# Patient Record
Sex: Female | Born: 1935 | Race: White | Hispanic: No | Smoking: Never smoker
Health system: Southern US, Community
[De-identification: ages and names within clinical notes are randomized; demographics above are authoritative.]

## PROBLEM LIST (undated history)

## (undated) DIAGNOSIS — D649 Anemia, unspecified: Secondary | ICD-10-CM

## (undated) DIAGNOSIS — E785 Hyperlipidemia, unspecified: Secondary | ICD-10-CM

## (undated) DIAGNOSIS — F32A Depression, unspecified: Secondary | ICD-10-CM

## (undated) DIAGNOSIS — F329 Major depressive disorder, single episode, unspecified: Secondary | ICD-10-CM

## (undated) DIAGNOSIS — K5792 Diverticulitis of intestine, part unspecified, without perforation or abscess without bleeding: Secondary | ICD-10-CM

## (undated) DIAGNOSIS — K219 Gastro-esophageal reflux disease without esophagitis: Secondary | ICD-10-CM

## (undated) DIAGNOSIS — I10 Essential (primary) hypertension: Secondary | ICD-10-CM

## (undated) DIAGNOSIS — F419 Anxiety disorder, unspecified: Secondary | ICD-10-CM

## (undated) DIAGNOSIS — N390 Urinary tract infection, site not specified: Secondary | ICD-10-CM

## (undated) DIAGNOSIS — N189 Chronic kidney disease, unspecified: Secondary | ICD-10-CM

## (undated) DIAGNOSIS — Z993 Dependence on wheelchair: Secondary | ICD-10-CM

## (undated) DIAGNOSIS — E039 Hypothyroidism, unspecified: Secondary | ICD-10-CM

## (undated) DIAGNOSIS — T7840XA Allergy, unspecified, initial encounter: Secondary | ICD-10-CM

## (undated) DIAGNOSIS — F0390 Unspecified dementia without behavioral disturbance: Secondary | ICD-10-CM

## (undated) DIAGNOSIS — H269 Unspecified cataract: Secondary | ICD-10-CM

## (undated) HISTORY — PX: EYE SURGERY: SHX253

## (undated) HISTORY — PX: HERNIA REPAIR: SHX51

## (undated) HISTORY — DX: Major depressive disorder, single episode, unspecified: F32.9

## (undated) HISTORY — DX: Unspecified cataract: H26.9

## (undated) HISTORY — DX: Anemia, unspecified: D64.9

## (undated) HISTORY — DX: Essential (primary) hypertension: I10

## (undated) HISTORY — PX: TOTAL VAGINAL HYSTERECTOMY: SHX2548

## (undated) HISTORY — DX: Depression, unspecified: F32.A

## (undated) HISTORY — DX: Gastro-esophageal reflux disease without esophagitis: K21.9

## (undated) HISTORY — PX: APPENDECTOMY: SHX54

## (undated) HISTORY — DX: Allergy, unspecified, initial encounter: T78.40XA

## (undated) HISTORY — DX: Hypothyroidism, unspecified: E03.9

## (undated) HISTORY — DX: Anxiety disorder, unspecified: F41.9

## (undated) HISTORY — DX: Diverticulitis of intestine, part unspecified, without perforation or abscess without bleeding: K57.92

## (undated) HISTORY — DX: Urinary tract infection, site not specified: N39.0

## (undated) HISTORY — DX: Hyperlipidemia, unspecified: E78.5

---

## 1997-08-26 ENCOUNTER — Ambulatory Visit (HOSPITAL_COMMUNITY): Admission: RE | Admit: 1997-08-26 | Discharge: 1997-08-26 | Payer: Self-pay | Admitting: Internal Medicine

## 1997-10-11 ENCOUNTER — Ambulatory Visit (HOSPITAL_COMMUNITY): Admission: RE | Admit: 1997-10-11 | Discharge: 1997-10-12 | Payer: Self-pay | Admitting: Neurosurgery

## 1997-10-11 ENCOUNTER — Encounter: Payer: Self-pay | Admitting: Neurosurgery

## 2000-05-29 ENCOUNTER — Encounter: Payer: Self-pay | Admitting: Family Medicine

## 2000-05-29 ENCOUNTER — Ambulatory Visit (HOSPITAL_COMMUNITY): Admission: RE | Admit: 2000-05-29 | Discharge: 2000-05-29 | Payer: Self-pay | Admitting: Family Medicine

## 2001-02-06 ENCOUNTER — Other Ambulatory Visit: Admission: RE | Admit: 2001-02-06 | Discharge: 2001-02-06 | Payer: Self-pay | Admitting: Internal Medicine

## 2001-06-19 ENCOUNTER — Encounter: Payer: Self-pay | Admitting: Family Medicine

## 2001-06-19 ENCOUNTER — Ambulatory Visit (HOSPITAL_COMMUNITY): Admission: RE | Admit: 2001-06-19 | Discharge: 2001-06-19 | Payer: Self-pay | Admitting: Family Medicine

## 2002-09-09 ENCOUNTER — Encounter: Payer: Self-pay | Admitting: Unknown Physician Specialty

## 2002-09-09 ENCOUNTER — Ambulatory Visit (HOSPITAL_COMMUNITY): Admission: RE | Admit: 2002-09-09 | Discharge: 2002-09-09 | Payer: Self-pay | Admitting: Unknown Physician Specialty

## 2002-09-24 ENCOUNTER — Encounter: Payer: Self-pay | Admitting: Unknown Physician Specialty

## 2002-09-24 ENCOUNTER — Ambulatory Visit (HOSPITAL_COMMUNITY): Admission: RE | Admit: 2002-09-24 | Discharge: 2002-09-24 | Payer: Self-pay | Admitting: Unknown Physician Specialty

## 2003-06-04 ENCOUNTER — Ambulatory Visit (HOSPITAL_COMMUNITY): Admission: RE | Admit: 2003-06-04 | Discharge: 2003-06-04 | Payer: Self-pay | Admitting: Unknown Physician Specialty

## 2003-12-27 ENCOUNTER — Ambulatory Visit: Payer: Self-pay | Admitting: Family Medicine

## 2003-12-30 ENCOUNTER — Ambulatory Visit (HOSPITAL_COMMUNITY): Admission: RE | Admit: 2003-12-30 | Discharge: 2003-12-30 | Payer: Self-pay | Admitting: Family Medicine

## 2004-01-10 ENCOUNTER — Ambulatory Visit: Payer: Self-pay | Admitting: *Deleted

## 2004-01-10 ENCOUNTER — Ambulatory Visit (HOSPITAL_COMMUNITY): Admission: RE | Admit: 2004-01-10 | Discharge: 2004-01-10 | Payer: Self-pay | Admitting: Family Medicine

## 2004-01-26 ENCOUNTER — Ambulatory Visit: Payer: Self-pay | Admitting: Family Medicine

## 2004-04-24 ENCOUNTER — Ambulatory Visit: Payer: Self-pay | Admitting: Family Medicine

## 2004-06-12 ENCOUNTER — Ambulatory Visit: Payer: Self-pay | Admitting: Family Medicine

## 2004-06-14 ENCOUNTER — Ambulatory Visit (HOSPITAL_COMMUNITY): Admission: RE | Admit: 2004-06-14 | Discharge: 2004-06-14 | Payer: Self-pay | Admitting: Family Medicine

## 2004-06-16 ENCOUNTER — Ambulatory Visit (HOSPITAL_COMMUNITY): Admission: RE | Admit: 2004-06-16 | Discharge: 2004-06-16 | Payer: Self-pay | Admitting: Family Medicine

## 2004-06-19 ENCOUNTER — Ambulatory Visit: Payer: Self-pay | Admitting: Family Medicine

## 2004-07-11 ENCOUNTER — Ambulatory Visit: Payer: Self-pay | Admitting: Family Medicine

## 2004-11-02 ENCOUNTER — Ambulatory Visit: Payer: Self-pay | Admitting: Family Medicine

## 2004-12-10 ENCOUNTER — Emergency Department (HOSPITAL_COMMUNITY): Admission: EM | Admit: 2004-12-10 | Discharge: 2004-12-10 | Payer: Self-pay | Admitting: Emergency Medicine

## 2004-12-21 ENCOUNTER — Ambulatory Visit: Payer: Self-pay | Admitting: Family Medicine

## 2005-01-15 ENCOUNTER — Ambulatory Visit: Payer: Self-pay | Admitting: Family Medicine

## 2005-02-27 ENCOUNTER — Ambulatory Visit: Payer: Self-pay | Admitting: Family Medicine

## 2005-03-13 ENCOUNTER — Ambulatory Visit (HOSPITAL_COMMUNITY): Admission: RE | Admit: 2005-03-13 | Discharge: 2005-03-13 | Payer: Self-pay | Admitting: Family Medicine

## 2005-04-23 ENCOUNTER — Ambulatory Visit: Payer: Self-pay | Admitting: Family Medicine

## 2005-08-01 ENCOUNTER — Ambulatory Visit (HOSPITAL_COMMUNITY): Admission: RE | Admit: 2005-08-01 | Discharge: 2005-08-01 | Payer: Self-pay | Admitting: Urology

## 2005-08-27 ENCOUNTER — Ambulatory Visit: Payer: Self-pay | Admitting: Family Medicine

## 2005-12-10 ENCOUNTER — Ambulatory Visit: Payer: Self-pay | Admitting: Family Medicine

## 2005-12-18 ENCOUNTER — Ambulatory Visit: Payer: Self-pay | Admitting: Family Medicine

## 2006-01-01 ENCOUNTER — Ambulatory Visit: Payer: Self-pay | Admitting: Family Medicine

## 2006-02-28 ENCOUNTER — Ambulatory Visit: Payer: Self-pay | Admitting: Family Medicine

## 2006-05-15 ENCOUNTER — Ambulatory Visit: Payer: Self-pay | Admitting: Family Medicine

## 2006-05-30 ENCOUNTER — Ambulatory Visit: Payer: Self-pay | Admitting: Family Medicine

## 2006-07-30 ENCOUNTER — Ambulatory Visit: Payer: Self-pay | Admitting: Family Medicine

## 2006-11-07 ENCOUNTER — Ambulatory Visit (HOSPITAL_COMMUNITY): Admission: RE | Admit: 2006-11-07 | Discharge: 2006-11-07 | Payer: Self-pay | Admitting: Family Medicine

## 2008-06-09 ENCOUNTER — Encounter: Admission: RE | Admit: 2008-06-09 | Discharge: 2008-06-09 | Payer: Self-pay | Admitting: Neurology

## 2008-06-23 ENCOUNTER — Encounter: Admission: RE | Admit: 2008-06-23 | Discharge: 2008-09-06 | Payer: Self-pay | Admitting: Neurology

## 2010-03-12 ENCOUNTER — Encounter: Payer: Self-pay | Admitting: Neurology

## 2010-07-07 NOTE — Procedures (Signed)
NAMEAALA, RANSOM NO.:  [id number]   MEDICAL RECORD NO.:  [id number]          PATIENT TYPE:  OUT   LOCATION:  RAD                           FACILITY:  [facility]   PHYSICIAN:  Vida Roller, M.D.   DATE OF BIRTH:  1924/04/06   DATE OF PROCEDURE:  01/10/2004  DATE OF DISCHARGE:                                  ECHOCARDIOGRAM   PRIMARY CARE PHYSICIAN:  Delaney Meigs, M.D.   TAPE NUMBER:  [id number].   TAPE COUNT:  [id number] through 3252.   HISTORY OF PRESENT ILLNESS:  This is a 77 year old woman with double vision,  no previous echocardiogram's.  The technical quality is adequate.   M-MODE TRACINGS:  The aorta is 28 mm.   Left atrium is 39 mm.   The septum is 18 mm.   Posterior wall is 15 mm.   Left ventricular diastolic dimension is 31 mm.   Left ventricular systolic dimension is 21 mm.   2-D AND DOPPLER IMAGING:  The left ventricle is normal size.  There is a  slightly small internal cavity diameter.  There is moderate concentric left  ventricular hypertrophy with significant predominance in the septum  consistent with a sigmoid septum of the elderly.  There is no obvious left  ventricular outflow tract gradient.  The overall ejection fraction is  estimated at 65 to 70%.  There are no obvious wall motion abnormalities  seen.   The right ventricle is normal size with normal systolic function.   Both atria are top-normal in size.   The aortic valve is sclerotic with no stenosis or regurgitation.   The mitral valve has some annular calcification and mild myxomatous change,  has trace insufficiency, no stenosis is seen.   The tricuspid valve has mild regurgitation.   Pulmonic valve not well seen.   Pericardial structures are without effusion.   The inferior vena cava appears to be normal size, though it is not well  seen.  There are views which make it difficult to interpret the area around  where it enters the right atrium.   The ascending aorta  appears to be enlarged in the parasternal long axis  view.  Clinical correlation is advised.     Trey Paula   JH/MEDQ  D:  01/10/2004  T:  01/10/2004  Job:  [id number]

## 2010-08-22 ENCOUNTER — Other Ambulatory Visit (HOSPITAL_COMMUNITY): Payer: Self-pay | Admitting: Family Medicine

## 2010-08-22 DIAGNOSIS — Z139 Encounter for screening, unspecified: Secondary | ICD-10-CM

## 2010-08-29 ENCOUNTER — Ambulatory Visit (HOSPITAL_COMMUNITY): Payer: Self-pay

## 2010-09-04 ENCOUNTER — Ambulatory Visit (HOSPITAL_COMMUNITY)
Admission: RE | Admit: 2010-09-04 | Discharge: 2010-09-04 | Disposition: A | Discharge status: Home / Self Care | Payer: Medicare Other | Source: Ambulatory Visit | Attending: Family Medicine | Admitting: Family Medicine

## 2010-09-04 DIAGNOSIS — Z1231 Encounter for screening mammogram for malignant neoplasm of breast: Secondary | ICD-10-CM | POA: Insufficient documentation

## 2010-09-04 DIAGNOSIS — Z139 Encounter for screening, unspecified: Secondary | ICD-10-CM

## 2011-08-15 ENCOUNTER — Other Ambulatory Visit (HOSPITAL_COMMUNITY): Payer: Self-pay | Admitting: Family Medicine

## 2011-08-15 DIAGNOSIS — Z139 Encounter for screening, unspecified: Secondary | ICD-10-CM

## 2011-09-06 ENCOUNTER — Ambulatory Visit (HOSPITAL_COMMUNITY): Payer: Medicare Other

## 2011-09-10 ENCOUNTER — Ambulatory Visit (HOSPITAL_COMMUNITY)
Admission: RE | Admit: 2011-09-10 | Discharge: 2011-09-10 | Disposition: A | Discharge status: Home / Self Care | Payer: Medicare Other | Source: Ambulatory Visit | Attending: Family Medicine | Admitting: Family Medicine

## 2011-09-10 DIAGNOSIS — Z1231 Encounter for screening mammogram for malignant neoplasm of breast: Secondary | ICD-10-CM | POA: Insufficient documentation

## 2011-09-10 DIAGNOSIS — Z139 Encounter for screening, unspecified: Secondary | ICD-10-CM

## 2011-11-29 ENCOUNTER — Ambulatory Visit: Payer: Medicare Other | Attending: Family Medicine | Admitting: Sleep Medicine

## 2011-11-29 DIAGNOSIS — G4733 Obstructive sleep apnea (adult) (pediatric): Secondary | ICD-10-CM | POA: Insufficient documentation

## 2011-11-29 DIAGNOSIS — G4700 Insomnia, unspecified: Secondary | ICD-10-CM

## 2011-12-05 NOTE — Procedures (Signed)
[facility] Brannon Levene A. Gerilyn Pilgrim, MD     [email or web address]        NAMEEVANGELYNE, Deborah Rivers              ACCOUNT NO.:  [id number]  MEDICAL RECORD NO.:  [id number]          PATIENT TYPE:  OUT  LOCATION:  SLEEP LAB                     FACILITY:  [facility]  PHYSICIAN:  Erasto Sleight A. Gerilyn Pilgrim, M.D. DATE OF BIRTH:  1924/12/24  DATE OF STUDY:                           NOCTURNAL POLYSOMNOGRAM  REFERRING PHYSICIAN:  Delaney Meigs, M.D.  INDICATION:  An 78 year old, who presents with difficulty falling, restless sleep, fatigue, and snoring.  MEDICATIONS:  Amlodipine, simvastatin, vitamin D, and Synthroid.  EPWORTH SLEEPINESS SCALE:  4.  BMI 23.  ARCHITECTURAL SUMMARY:  The total recording time is 382 minutes.  Sleep efficiency 46%, sleep latency 91 minutes, REM latency 107 minutes, stage N1 of 21%, N2 of 59%, N3 of 1%, and REM sleep 19%.  RESPIRATORY SUMMARY:  Baseline oxygen saturation is 95, lowest saturation 80 during non-REM sleep.  Diagnostic AHI is 12 and RDI also 12.  The events tend to occur more during REM sleep with a REM index of 25.  LIMB MOVEMENT SUMMARY:  PLM index 33.  ELECTROCARDIOGRAM SUMMARY:  Average heart rate is 80 with no significant dysrhythmias observed.  IMPRESSION: 1. Moderate obstructive sleep apnea syndrome. 2. Moderate to severe periodic limb movement disorder sleep. 3. Abnormal sleep architecture with poor sleep efficiency.  RECOMMENDATIONS:  Formal CPAP titration study.  Thanks for this referral.    Nadalee Neiswender A. Gerilyn Pilgrim, M.D.    KAD/MEDQ  D:  12/05/2011 08:54:04  T:  12/05/2011 09:23:16  Job:  [id number]

## 2011-12-18 ENCOUNTER — Ambulatory Visit: Payer: Medicare Other | Attending: Family Medicine | Admitting: Sleep Medicine

## 2011-12-18 ENCOUNTER — Encounter: Payer: Self-pay | Admitting: Neurology

## 2011-12-18 DIAGNOSIS — G4700 Insomnia, unspecified: Secondary | ICD-10-CM

## 2011-12-18 DIAGNOSIS — G4733 Obstructive sleep apnea (adult) (pediatric): Secondary | ICD-10-CM | POA: Insufficient documentation

## 2011-12-19 NOTE — Procedures (Signed)
[facility] Phillipa Morden A. Gerilyn Pilgrim, MD     [email or web address]        LOCATION: SLEEP LAB FACILITY: [facility]  PHYSICIAN: Verlie Hellenbrand A. Gerilyn Pilgrim, M.D.   DATE OF STUDY: 12/18/2011  NOCTURNAL POLYSOMNOGRAM   REFERRING PHYSICIAN: Dr. Lysbeth Galas.  INDICATIONS: This is an 78 year old who was diagnosed with obstructive sleep apnea syndrome with a previous recording.  MEDICATIONS:  Amlodipine, simvastatin, Synthroid  EPWORTH SLEEPINESS SCALE: 4.  BMI: 23.   ARCHITECTURAL SUMMARY: Total recording time of 415 minutes. Sleep efficiency is very poor at 33%. The sleep latency is 98 minutes. The REM latency 147 minutes. Stage and one 70%, stage and to 64%, stage and 33% and REM sleep 15%.  RESPIRATORY DATA:  Baseline oxygen saturation is 97%. The lowest saturation is 90% during non-REM sleep. The patient was placed on positive pressure is between 5 and 8. The optimal pressure is 8 with resolution of obstructive events and good tolerance.  LIMB MOVEMENT SUMMARY: PLM index 6.5.   ELECTROCARDIOGRAM SUMMARY: Average heart rate is  82 with no significant  dysrhythmias observed.   IMPRESSION: Obstructive sleep apnea syndrome which responds well to a CPAP of 8.  Leahna Hewson A. Gerilyn Pilgrim, M.D. Diplomat, Biomedical engineer of Sleep Medicine.

## 2012-10-21 ENCOUNTER — Encounter: Payer: Self-pay | Admitting: Gastroenterology

## 2012-11-17 ENCOUNTER — Ambulatory Visit (INDEPENDENT_AMBULATORY_CARE_PROVIDER_SITE_OTHER): Payer: Medicare Other | Admitting: Gastroenterology

## 2012-11-17 ENCOUNTER — Encounter: Payer: Self-pay | Admitting: Gastroenterology

## 2012-11-17 VITALS — BP 122/70 | HR 92 | Ht 63.0 in | Wt 124.0 lb

## 2012-11-17 DIAGNOSIS — R634 Abnormal weight loss: Secondary | ICD-10-CM

## 2012-11-17 DIAGNOSIS — R1319 Other dysphagia: Secondary | ICD-10-CM

## 2012-11-17 DIAGNOSIS — K219 Gastro-esophageal reflux disease without esophagitis: Secondary | ICD-10-CM

## 2012-11-17 NOTE — Progress Notes (Signed)
History of Present Illness: This is an 79 year old female who relates a several year history of intermittent heartburn following meals and occasional burning and difficulty swallowing solids such as meat and the symptoms occur every several months. She notes occasional throat clearing and sinus drainage. She states she's lost weight over the past several months in her appetite has not been good. She cannot quantify the amount. She states she previously underwent colonoscopy at [facility] about 7 or 8 years ago and she were reports that examination was normal. She was prescribed omeprazole but only takes it occasionally. Denies abdominal pain, constipation, diarrhea, change in stool caliber, melena, hematochezia, nausea, vomiting, chest pain.  Review of Systems: Pertinent positive and negative review of systems were noted in the above HPI section. All other review of systems were otherwise negative.  Current Medications, Allergies, Past Medical History, Past Surgical History, Family History and Social History were reviewed in [employer] record.  Physical Exam: General: Well developed , well nourished, no acute distress Head: Normocephalic and atraumatic Eyes:  sclerae anicteric, EOMI Ears: Normal auditory acuity Mouth: No deformity or lesions Neck: Supple, no masses or thyromegaly Lungs: Clear throughout to auscultation Heart: Regular rate and rhythm; no murmurs, rubs or bruits Abdomen: Soft, non tender and non distended. No masses, hepatosplenomegaly or hernias noted. Normal Bowel sounds Rectal: deferred Musculoskeletal: Symmetrical with no gross deformities  Skin: No lesions on visible extremities Pulses:  Normal pulses noted Extremities: No clubbing, cyanosis, edema or deformities noted Neurological: Alert oriented x 4, grossly nonfocal Cervical Nodes:  No significant cervical adenopathy Inguinal Nodes: No significant inguinal adenopathy Psychological:  Alert  and cooperative. Normal mood and affect  Assessment and Recommendations:  1. GERD, dysphagia and weight loss. Take omeprazole 40 mg every morning as prescribed. Begin standard antireflux measures. Schedule an upper endoscopy with possible dilation. The risks, benefits, and alternatives to endoscopy with possible biopsy and possible dilation were discussed with the patient and they consent to proceed. Will attempt to obtain records from prior colonoscopy. Consider abdominal/pelvic CT if no source of weight loss is uncovered.

## 2012-11-17 NOTE — Patient Instructions (Addendum)
You have been scheduled for an endoscopy with propofol. Please follow written instructions given to you at your visit today. If you use inhalers (even only as needed), please bring them with you on the day of your procedure.  Take your omeprazole once daily every day.  Patient advised to avoid spicy, acidic, citrus, chocolate, mints, fruit and fruit juices.  Limit the intake of caffeine, alcohol and Soda.  Don't exercise too soon after eating.  Don't lie down within 3-4 hours of eating.  Elevate the head of your bed.  Thank you for choosing me and [facility].  Venita Lick. Pleas Koch., MD., Clementeen Graham

## 2012-11-18 ENCOUNTER — Encounter: Payer: Self-pay | Admitting: Gastroenterology

## 2012-11-18 ENCOUNTER — Ambulatory Visit (AMBULATORY_SURGERY_CENTER): Payer: Medicare Other | Admitting: Gastroenterology

## 2012-11-18 VITALS — BP 152/81 | HR 76 | Temp 97.6°F | Resp 20 | Ht 63.0 in | Wt 124.0 lb

## 2012-11-18 DIAGNOSIS — R1319 Other dysphagia: Secondary | ICD-10-CM

## 2012-11-18 DIAGNOSIS — K222 Esophageal obstruction: Secondary | ICD-10-CM

## 2012-11-18 DIAGNOSIS — K219 Gastro-esophageal reflux disease without esophagitis: Secondary | ICD-10-CM

## 2012-11-18 MED ORDER — SODIUM CHLORIDE 0.9 % IV SOLN: 500.0000 mL | INTRAVENOUS | Status: DC

## 2012-11-18 NOTE — Progress Notes (Addendum)
Patient did not have preoperative order for IV antibiotic SSI prophylaxis. (G8918)  Patient did not experience any of the following events: a burn prior to discharge; a fall within the facility; wrong site/side/patient/procedure/implant event; or a hospital transfer or hospital admission upon discharge from the facility. (G8907)  

## 2012-11-18 NOTE — Progress Notes (Signed)
Called to room to assist during endoscopic procedure.  Patient ID and intended procedure confirmed with present staff. Received instructions for my participation in the procedure from the performing physician.  

## 2012-11-18 NOTE — Patient Instructions (Addendum)
YOU HAD AN ENDOSCOPIC PROCEDURE TODAY AT THE [facility]: Refer to the procedure report that was given to you for any specific questions about what was found during the examination.  If the procedure report does not answer your questions, please call your gastroenterologist to clarify.  If you requested that your care partner not be given the details of your procedure findings, then the procedure report has been included in a sealed envelope for you to review at your convenience later.  YOU SHOULD EXPECT: Some feelings of bloating in the abdomen. Passage of more gas than usual.  Walking can help get rid of the air that was put into your GI tract during the procedure and reduce the bloating. If you had a lower endoscopy (such as a colonoscopy or flexible sigmoidoscopy) you may notice spotting of blood in your stool or on the toilet paper. If you underwent a bowel prep for your procedure, then you may not have a normal bowel movement for a few days.  DIET:You were dilated so you may not have anything by mouth until 10:15 am.  If the clear liquids are tolderated, you may go to the soft diet on yur sheet.  Do force fluids today.  Call us if you have any issues.  You should avoid alcoholic beverages for 24 hours.  ACTIVITY: Your care partner should take you home directly after the procedure.  You should plan to take it easy, moving slowly for the rest of the day.  You can resume normal activity the day after the procedure however you should NOT DRIVE or use heavy machinery for 24 hours (because of the sedation medicines used during the test).    SYMPTOMS TO REPORT IMMEDIATELY: A gastroenterologist can be reached at any hour.  During normal business hours, 8:30 AM to 5:00 PM Monday through Friday, call [phone].  After hours and on weekends, please call the GI answering service at [phone] who will take a message and have the physician on call contact you.   Following upper endoscopy  (EGD)  Vomiting of blood or coffee ground material  New chest pain or pain under the shoulder blades  Painful or persistently difficult swallowing  New shortness of breath  Fever of 100F or higher  Black, tarry-looking stools  FOLLOW UP: If any biopsies were taken you will be contacted by phone or by letter within the next 1-3 weeks.  Call your gastroenterologist if you have not heard about the biopsies in 3 weeks.  Our staff will call the home number listed on your records the next business day following your procedure to check on you and address any questions or concerns that you may have at that time regarding the information given to you following your procedure. This is a courtesy call and so if there is no answer at the home number and we have not heard from you through the emergency physician on call, we will assume that you have returned to your regular daily activities without incident.  SIGNATURES/CONFIDENTIALITY: You and/or your care partner have signed paperwork which will be entered into your electronic medical record.  These signatures attest to the fact that that the information above on your After Visit Summary has been reviewed and is understood.  Full responsibility of the confidentiality of this discharge information lies with you and/or your care-partner.

## 2012-11-18 NOTE — Op Note (Signed)
[facility] [address] [place], [address]   ENDOSCOPY PROCEDURE REPORT  PATIENT: Deborah, Rivers  MR#: [id number] BIRTHDATE: 17-Dec-1924 , 88  yrs. old GENDER: Female ENDOSCOPIST: Meryl Dare, MD, [facility] REFERRED BY:  Joette Catching, M.D. PROCEDURE DATE:  11/18/2012 PROCEDURE:  EGD, diagnostic and Savary dilation of esophagus ASA CLASS:     Class II INDICATIONS:  Dysphagia.   History of esophageal reflux. MEDICATIONS: MAC sedation, administered by CRNA and propofol (Diprivan) 150mg  IV TOPICAL ANESTHETIC: Cetacaine Spray DESCRIPTION OF PROCEDURE: After the risks benefits and alternatives of the procedure were thoroughly explained, informed consent was obtained.  The LB [id number] endoscope was introduced through the mouth and advanced to the second portion of the duodenum  without limitations.  The instrument was slowly withdrawn as the mucosa was fully examined.  ESOPHAGUS: A stricture was found at the gastroesophageal junction. It measured 12 mm in diameter. The stenosis was traversable with the endoscope.   There was LA Class B esophagitis noted.   The esophagus was otherwise normal. STOMACH: Erosive gastritis was found in the gastric body.   The stomach otherwise appeared normal. DUODENUM: The duodenal mucosa showed no abnormalities in the bulb and second portion of the duodenum.  Retroflexed views revealed a 4 cm hiatal hernia. A guidewire was place and the scope was then withdrawn from the patient. 13 mm, 14 mm and 15 mm Savary dilator were passed over the guidewire with mild resistance and no heme noted and the procedure completed.  COMPLICATIONS: There were no complications.  ENDOSCOPIC IMPRESSION: 1.   Stricture at the gastroesophageal junction 2.   LA Class B esophagitis 3.   Hiatal hernia 4.   Erosive gastritis in the gastric body  RECOMMENDATIONS: 1.  Anti-reflux regimen long term 2.  Continue PPI: omeprazole 40 mg po qam long  term 3.  post dilation instructions   eSigned:  Meryl Dare, MD, [facility] 11/18/2012 9:04 AM

## 2012-11-19 ENCOUNTER — Telehealth: Payer: Self-pay | Admitting: *Deleted

## 2012-11-19 NOTE — Telephone Encounter (Signed)
  Follow up Call-  Call back number 11/18/2012  Post procedure Call Back phone  # [phone]  Permission to leave phone message Yes     Patient questions:  Do you have a fever, pain , or abdominal swelling? no Pain Score  0 *  Have you tolerated food without any problems? yes  Have you been able to return to your normal activities? yes  Do you have any questions about your discharge instructions: Diet   no Medications  no Follow up visit  no  Do you have questions or concerns about your Care? no  Actions: * If pain score is 4 or above: No action needed, pain <4.

## 2013-04-08 ENCOUNTER — Encounter: Payer: Self-pay | Admitting: Women's Health

## 2013-06-22 DIAGNOSIS — N390 Urinary tract infection, site not specified: Secondary | ICD-10-CM | POA: Insufficient documentation

## 2013-06-22 DIAGNOSIS — M549 Dorsalgia, unspecified: Secondary | ICD-10-CM | POA: Insufficient documentation

## 2013-07-20 DIAGNOSIS — D649 Anemia, unspecified: Secondary | ICD-10-CM | POA: Insufficient documentation

## 2014-02-25 ENCOUNTER — Encounter: Payer: Self-pay | Admitting: *Deleted

## 2014-02-25 ENCOUNTER — Ambulatory Visit: Payer: Medicare Other | Attending: Physical Medicine and Rehabilitation | Admitting: Physical Therapy

## 2014-02-25 DIAGNOSIS — M545 Low back pain: Secondary | ICD-10-CM | POA: Diagnosis present

## 2014-02-25 DIAGNOSIS — M5442 Lumbago with sciatica, left side: Secondary | ICD-10-CM | POA: Insufficient documentation

## 2014-02-25 DIAGNOSIS — M546 Pain in thoracic spine: Secondary | ICD-10-CM | POA: Insufficient documentation

## 2014-02-26 ENCOUNTER — Encounter: Payer: Self-pay | Admitting: Cardiology

## 2014-02-26 ENCOUNTER — Ambulatory Visit (INDEPENDENT_AMBULATORY_CARE_PROVIDER_SITE_OTHER): Payer: Medicare Other | Admitting: Cardiology

## 2014-02-26 ENCOUNTER — Institutional Professional Consult (permissible substitution): Payer: Medicare Other | Admitting: Cardiovascular Disease

## 2014-02-26 VITALS — BP 118/66 | HR 80 | Ht 63.0 in | Wt 123.0 lb

## 2014-02-26 DIAGNOSIS — I700 Atherosclerosis of aorta: Secondary | ICD-10-CM

## 2014-02-26 DIAGNOSIS — E782 Mixed hyperlipidemia: Secondary | ICD-10-CM

## 2014-02-26 DIAGNOSIS — R0989 Other specified symptoms and signs involving the circulatory and respiratory systems: Secondary | ICD-10-CM

## 2014-02-26 DIAGNOSIS — I10 Essential (primary) hypertension: Secondary | ICD-10-CM | POA: Insufficient documentation

## 2014-02-26 DIAGNOSIS — R011 Cardiac murmur, unspecified: Secondary | ICD-10-CM

## 2014-02-26 NOTE — Progress Notes (Signed)
Reason for visit: Reported aortic calcification  Clinical Summary Deborah Rivers is an 81 y.o.female referred for cardiology consultation by Ms. Hemberg NP with [facility]. Records indicate that she was noted to have some degree of aortic calcification based on plain films obtained during a recent orthopedic consultation - details are not clear. She is here with a family member today. She reports no chest pain symptoms or unusual shortness of breath. Tries to stay as active as she can. She reports compliance with her medications, outlined below, and including aspirin, Norvasc, and simvastatin. Statin dose was just increased for more aggressive lipid management. ECG today showed sinus rhythm with borderline low voltage and decreased R wave progression. She has no known history of ischemic heart disease or myocardial infarction.  Labwork from November 2015 showed hemoglobin 12.2, platelets 323, BUN 17, creatinine 1.2, potassium 4.2, AST 19, ALT 9, cholesterol 211, triglycerides 103, HDL 94, LDL 96, TSH 2.8.  Old chart reviewed. Carotid Dopplers from 2008 reported minimal ICA atherosclerotic plaque. Previous echocardiogram from 2005 reported moderate LVH with LVEF 65-70%, sclerotic aortic valve without stenosis, Mac with thickened mitral valve and trace mitral regurgitation, mild tricuspid regurgitation. She has had no follow-up testing since that time.  Allergies  Allergen Reactions  . Sulfa Antibiotics Rash    Current Outpatient Prescriptions  Medication Sig Dispense Refill  . amLODipine (NORVASC) 5 MG tablet Take 5 mg by mouth daily.    Marland Kitchen aspirin 81 MG tablet Take 81 mg by mouth daily.    . DULoxetine (CYMBALTA) 60 MG capsule Take 60 mg by mouth daily.    . ergocalciferol (VITAMIN D2) 50000 UNITS capsule Take 50,000 Units by mouth once a week.    . levothyroxine (SYNTHROID, LEVOTHROID) 25 MCG tablet Take 25 mcg by mouth daily before breakfast.    . LORazepam (ATIVAN)  0.5 MG tablet Take 0.5 mg by mouth as needed for anxiety.    Marland Kitchen omeprazole (PRILOSEC) 40 MG capsule Take 40 mg by mouth daily.    . simvastatin (ZOCOR) 20 MG tablet Take 20 mg by mouth every evening.     No current facility-administered medications for this visit.    Past Medical History  Diagnosis Date  . Essential hypertension   . Hypothyroidism   . Hyperlipidemia   . GERD (gastroesophageal reflux disease)   . Anemia     Past Surgical History  Procedure Laterality Date  . Total vaginal hysterectomy    . Hernia repair    . Appendectomy      Family History  Problem Relation Age of Onset  . Diabetes    . Heart disease    . Kidney disease      Social History Ms. Katich reports that she has never smoked. She has never used smokeless tobacco. Ms. Juenger reports that she does not drink alcohol.  Review of Systems Complete review of systems negative except as otherwise outlined in the clinical summary and also the following. No palpitations or syncope. No focal motor weakness. Reports arthritic pains in her back and legs, also apparently bursitis of her left hip recently. No definite claudication. Mild ankle edema.  Physical Examination Filed Vitals:   02/26/14 0850  BP: 118/66  Pulse: 80   Filed Weights   02/26/14 0850  Weight: 123 lb (55.792 kg)   Thin, elderly woman, appears comfortable at rest. HEENT: Conjunctiva and lids normal, oropharynx clear. Neck: Supple, no elevated JVP, soft carotid bruits, no thyromegaly. Lungs: Clear to  auscultation, nonlabored breathing at rest. Cardiac: Regular rate and rhythm, no S3, 2/6 basal systolic murmur, no pericardial rub. Abdomen: Soft, nontender, bowel sounds present, no bruit, no guarding or rebound. Extremities: Mild ankle edema, distal pulses 2+. Skin: Warm and dry. Musculoskeletal: Mild kyphosis. Neuropsychiatric: Alert and oriented x3, affect grossly appropriate.   Problem List and Plan   Aortic  calcification Reported by plain films, details are not available to me at this time. Ms. Jernberg almost certainly has some degree of atherosclerosis, and has also had documentation of this in the past based on prior testing. No history of ischemic heart disease or aneurysmal disease however. She is not reporting any chest pain or unusual breathlessness. ECG reviewed and nonspecific. I have recommended carotid Dopplers to assess for any significant degree of carotid stenosis that would require intervention beyond medical therapy. We will also obtain an echocardiogram to assess valvular status. Follow-up arranged. Otherwise continue aspirin and statin, efforts at blood pressure control.  Mixed hyperlipidemia Recent LDL 96, simvastatin just increased to 20 mg daily.  Essential hypertension She continues on Norvasc, blood pressure normal today.    Satira Sark, M.D., F.A.C.C.

## 2014-02-26 NOTE — Patient Instructions (Signed)
Your physician recommends that you schedule a follow-up appointment in: 6 months. You will receive a reminder letter in the mail in about 4 months reminding you to call and schedule your appointment. If you don't receive this letter, please contact our office. Your physician recommends that you continue on your current medications as directed. Please refer to the Current Medication list given to you today. Your physician has requested that you have an echocardiogram. Echocardiography is a painless test that uses sound waves to create images of your heart. It provides your doctor with information about the size and shape of your heart and how well your heart's chambers and valves are working. This procedure takes approximately one hour. There are no restrictions for this procedure. Your physician has requested that you have a carotid duplex. This test is an ultrasound of the carotid arteries in your neck. It looks at blood flow through these arteries that supply the brain with blood. Allow one hour for this exam. There are no restrictions or special instructions.

## 2014-02-26 NOTE — Assessment & Plan Note (Signed)
She continues on Norvasc, blood pressure normal today.

## 2014-02-26 NOTE — Assessment & Plan Note (Signed)
Reported by plain films, details are not available to me at this time. Deborah Rivers almost certainly has some degree of atherosclerosis, and has also had documentation of this in the past based on prior testing. No history of ischemic heart disease or aneurysmal disease however. She is not reporting any chest pain or unusual breathlessness. ECG reviewed and nonspecific. I have recommended carotid Dopplers to assess for any significant degree of carotid stenosis that would require intervention beyond medical therapy. We will also obtain an echocardiogram to assess valvular status. Follow-up arranged. Otherwise continue aspirin and statin, efforts at blood pressure control.

## 2014-02-26 NOTE — Assessment & Plan Note (Signed)
Recent LDL 96, simvastatin just increased to 20 mg daily.

## 2014-03-02 ENCOUNTER — Ambulatory Visit: Payer: Medicare Other | Admitting: *Deleted

## 2014-03-02 DIAGNOSIS — M5442 Lumbago with sciatica, left side: Secondary | ICD-10-CM | POA: Diagnosis not present

## 2014-03-04 ENCOUNTER — Ambulatory Visit: Payer: Medicare Other | Admitting: Physical Therapy

## 2014-03-04 DIAGNOSIS — M5442 Lumbago with sciatica, left side: Secondary | ICD-10-CM | POA: Diagnosis not present

## 2014-03-08 ENCOUNTER — Telehealth: Payer: Self-pay | Admitting: Gastroenterology

## 2014-03-08 NOTE — Telephone Encounter (Signed)
Patient advised that she should continue her Prilosec or a similar drug long term per procedure report on 11/18/12.  All questions answered.  She will call back for any additional questions or concerns

## 2014-03-09 ENCOUNTER — Ambulatory Visit: Payer: Medicare Other | Admitting: Physical Therapy

## 2014-03-09 DIAGNOSIS — M5442 Lumbago with sciatica, left side: Secondary | ICD-10-CM | POA: Diagnosis not present

## 2014-03-10 ENCOUNTER — Other Ambulatory Visit (INDEPENDENT_AMBULATORY_CARE_PROVIDER_SITE_OTHER): Payer: Medicare Other

## 2014-03-10 ENCOUNTER — Encounter (INDEPENDENT_AMBULATORY_CARE_PROVIDER_SITE_OTHER): Payer: Medicare Other

## 2014-03-10 ENCOUNTER — Other Ambulatory Visit: Payer: Self-pay

## 2014-03-10 DIAGNOSIS — R0989 Other specified symptoms and signs involving the circulatory and respiratory systems: Secondary | ICD-10-CM

## 2014-03-10 DIAGNOSIS — I700 Atherosclerosis of aorta: Secondary | ICD-10-CM

## 2014-03-10 DIAGNOSIS — R011 Cardiac murmur, unspecified: Secondary | ICD-10-CM

## 2014-03-11 ENCOUNTER — Encounter: Payer: Medicare Other | Admitting: *Deleted

## 2014-03-16 ENCOUNTER — Encounter: Payer: Medicare Other | Admitting: *Deleted

## 2014-03-17 ENCOUNTER — Telehealth: Payer: Self-pay | Admitting: *Deleted

## 2014-03-17 NOTE — Telephone Encounter (Signed)
Patient informed. 

## 2014-03-17 NOTE — Telephone Encounter (Signed)
-----   Message from Satira Sark, MD sent at 03/11/2014  9:19 AM EST ----- Reviewed report. Please let her know that there is mild atherosclerotic plaque with her carotid arteries, only 1-39% bilateral ICA stenosis. We will treat this with medications as mentioned in the last note, she does not further intervention for this at this particular time.

## 2014-03-17 NOTE — Telephone Encounter (Signed)
-----   Message from Satira Sark, MD sent at 03/10/2014  5:15 PM EST ----- Reviewed report. Please let her know that LV systolic function is normal. She does have some mild calcification of the aortic valve as well as mitral valve with mild aortic regurgitation and mitral regurgitation, neither of these issues should be symptom provoking however, and can be followed conservatively. As we mentioned at her office visit, focus will be on medical therapy, will also follow-up carotid studies that are pending.

## 2014-03-18 ENCOUNTER — Encounter: Payer: Medicare Other | Admitting: *Deleted

## 2014-06-08 ENCOUNTER — Encounter: Payer: Self-pay | Admitting: Cardiovascular Disease

## 2014-06-08 ENCOUNTER — Ambulatory Visit (INDEPENDENT_AMBULATORY_CARE_PROVIDER_SITE_OTHER): Payer: Medicare Other | Admitting: Cardiovascular Disease

## 2014-06-08 VITALS — BP 128/64 | HR 71 | Ht 63.0 in | Wt 118.0 lb

## 2014-06-08 DIAGNOSIS — I700 Atherosclerosis of aorta: Secondary | ICD-10-CM | POA: Diagnosis not present

## 2014-06-08 DIAGNOSIS — E782 Mixed hyperlipidemia: Secondary | ICD-10-CM

## 2014-06-08 DIAGNOSIS — I10 Essential (primary) hypertension: Secondary | ICD-10-CM

## 2014-06-08 NOTE — Progress Notes (Signed)
06/08/2014 Deborah Rivers   Sep 24, 1924  [id number]  Primary Physician Sherrie Mustache, MD Primary Cardiologist: Lorretta Harp MD Renae Gloss   HPI:  Deborah Rivers is a delightful 81 year old thin-appearing widowed Caucasian female mother of 2 children, grandmother of 4 grandchildren whose primary care physician is Dr. Edrick Oh. She was referred to be established for ongoing cardiovascular care. Her chronic risk factor profile is notable only for 2 hypertension and hyperlipidemia as well as family history. Her father had myocardial infarction as typical of her brothers. She has never had a heart attack or stroke and denies chest pain or shortness of breath. She also denies claudication. She walks on the treadmill without limitation. She clearly had an x-ray performed in orthopedic surgeon's office was remarkable for "atherosclerosis".   Current Outpatient Prescriptions  Medication Sig Dispense Refill  . amLODipine (NORVASC) 5 MG tablet Take 5 mg by mouth daily.    Marland Kitchen aspirin 81 MG tablet Take 81 mg by mouth daily.    Marland Kitchen atorvastatin (LIPITOR) 20 MG tablet Take 20 mg by mouth daily.    . DULoxetine (CYMBALTA) 60 MG capsule Take 30 mg by mouth daily.     . ergocalciferol (VITAMIN D2) 50000 UNITS capsule Take 50,000 Units by mouth once a week.    . levothyroxine (SYNTHROID, LEVOTHROID) 25 MCG tablet Take 25 mcg by mouth daily before breakfast.    . LORazepam (ATIVAN) 0.5 MG tablet Take 0.5 mg by mouth as needed for anxiety.     No current facility-administered medications for this visit.    Allergies  Allergen Reactions  . Sulfa Antibiotics Rash    History   Social History  . Marital Status: Widowed    Spouse Name: N/A  . Number of Children: 2  . Years of Education: N/A   Occupational History  . Retried    Social History Main Topics  . Smoking status: Never Smoker   . Smokeless tobacco: Never Used  . Alcohol Use: No  . Drug Use: No  . Sexual Activity: Not  on file   Other Topics Concern  . Not on file   Social History Narrative     Review of Systems: General: negative for chills, fever, night sweats or weight changes.  Cardiovascular: negative for chest pain, dyspnea on exertion, edema, orthopnea, palpitations, paroxysmal nocturnal dyspnea or shortness of breath Dermatological: negative for rash Respiratory: negative for cough or wheezing Urologic: negative for hematuria Abdominal: negative for nausea, vomiting, diarrhea, bright red blood per rectum, melena, or hematemesis Neurologic: negative for visual changes, syncope, or dizziness All other systems reviewed and are otherwise negative except as noted above.    Blood pressure 128/64, pulse 71, height 5\' 3"  (1.6 m), weight 118 lb (53.524 kg).  General appearance: alert and no distress Neck: no adenopathy, no carotid bruit, no JVD, supple, symmetrical, trachea midline and thyroid not enlarged, symmetric, no tenderness/mass/nodules Lungs: clear to auscultation bilaterally Heart: regular rate and rhythm, S1, S2 normal, no murmur, click, rub or gallop Extremities: extremities normal, atraumatic, no cyanosis or edema and 22+ pedal pulses bilaterally  EKG normal sinus rhythm at 71 without ST or T-wave changes. I personally reviewed this EKG  ASSESSMENT AND PLAN:   Mixed hyperlipidemia History of hyperlipidemia on atorvastatin 20 mg a day followed by her PCP   Essential hypertension History of hypertension on amlodipine for blood pressure measured at 128/64. Continue current meds at current dosing   Aortic calcification History of aortic calcification on x-ray.  We will obtain those films and determine whether or not she needs additional imaging modalities.       Lorretta Harp MD FACP,FACC,FAHA, [facility] 06/08/2014 10:36 AM

## 2014-06-08 NOTE — Assessment & Plan Note (Signed)
History of hypertension on amlodipine for blood pressure measured at 128/64. Continue current meds at current dosing

## 2014-06-08 NOTE — Assessment & Plan Note (Signed)
History of hyperlipidemia on atorvastatin 20 mg a day followed by her PCP 

## 2014-06-08 NOTE — Assessment & Plan Note (Addendum)
History of aortic calcification on x-ray. We will obtain those films and determine whether or not she needs additional imaging modalities.

## 2014-06-08 NOTE — Patient Instructions (Signed)
  We will see you back in follow up in 1 year with Dr Gwenlyn Found.   Dr Gwenlyn Found has ordered: 1. Abdominal aorta duplex. During this test, an ultrasound is used to evaluate the aorta. Allow 30 minutes for this exam. Do not eat after midnight the day before and avoid carbonated beverages

## 2014-06-14 ENCOUNTER — Other Ambulatory Visit (HOSPITAL_COMMUNITY): Payer: Self-pay | Admitting: Cardiovascular Disease

## 2014-06-14 ENCOUNTER — Encounter (HOSPITAL_COMMUNITY): Payer: Medicare Other

## 2014-06-14 DIAGNOSIS — I700 Atherosclerosis of aorta: Secondary | ICD-10-CM

## 2014-06-16 ENCOUNTER — Emergency Department (HOSPITAL_COMMUNITY)
Admission: EM | Admit: 2014-06-16 | Discharge: 2014-06-16 | Disposition: A | Discharge status: Home / Self Care | Payer: Medicare Other | Attending: Emergency Medicine | Admitting: Emergency Medicine

## 2014-06-16 ENCOUNTER — Encounter (HOSPITAL_COMMUNITY): Payer: Self-pay

## 2014-06-16 ENCOUNTER — Other Ambulatory Visit: Payer: Self-pay | Admitting: *Deleted

## 2014-06-16 DIAGNOSIS — I10 Essential (primary) hypertension: Secondary | ICD-10-CM

## 2014-06-16 DIAGNOSIS — F419 Anxiety disorder, unspecified: Secondary | ICD-10-CM | POA: Diagnosis not present

## 2014-06-16 DIAGNOSIS — M549 Dorsalgia, unspecified: Secondary | ICD-10-CM | POA: Insufficient documentation

## 2014-06-16 DIAGNOSIS — Z862 Personal history of diseases of the blood and blood-forming organs and certain disorders involving the immune mechanism: Secondary | ICD-10-CM | POA: Diagnosis not present

## 2014-06-16 DIAGNOSIS — Z8719 Personal history of other diseases of the digestive system: Secondary | ICD-10-CM | POA: Diagnosis not present

## 2014-06-16 DIAGNOSIS — T380X5A Adverse effect of glucocorticoids and synthetic analogues, initial encounter: Secondary | ICD-10-CM | POA: Diagnosis not present

## 2014-06-16 DIAGNOSIS — Z7982 Long term (current) use of aspirin: Secondary | ICD-10-CM | POA: Diagnosis not present

## 2014-06-16 DIAGNOSIS — E785 Hyperlipidemia, unspecified: Secondary | ICD-10-CM | POA: Diagnosis not present

## 2014-06-16 DIAGNOSIS — Z79899 Other long term (current) drug therapy: Secondary | ICD-10-CM | POA: Insufficient documentation

## 2014-06-16 DIAGNOSIS — T50905A Adverse effect of unspecified drugs, medicaments and biological substances, initial encounter: Secondary | ICD-10-CM

## 2014-06-16 DIAGNOSIS — E039 Hypothyroidism, unspecified: Secondary | ICD-10-CM | POA: Insufficient documentation

## 2014-06-16 LAB — URINE MICROSCOPIC-ADD ON

## 2014-06-16 LAB — URINALYSIS, ROUTINE W REFLEX MICROSCOPIC
GLUCOSE, UA: NEGATIVE mg/dL
HGB URINE DIPSTICK: NEGATIVE
KETONES UR: 15 mg/dL — AB
Nitrite: NEGATIVE
PROTEIN: NEGATIVE mg/dL
Specific Gravity, Urine: 1.03 (ref 1.005–1.030)
UROBILINOGEN UA: 1 mg/dL (ref 0.0–1.0)
pH: 5 (ref 5.0–8.0)

## 2014-06-16 LAB — CBC
HCT: 38.1 % (ref 36.0–46.0)
Hemoglobin: 12.8 g/dL (ref 12.0–15.0)
MCH: 28.6 pg (ref 26.0–34.0)
MCHC: 33.6 g/dL (ref 30.0–36.0)
MCV: 85 fL (ref 78.0–100.0)
PLATELETS: 297 10*3/uL (ref 150–400)
RBC: 4.48 MIL/uL (ref 3.87–5.11)
RDW: 15.7 % — ABNORMAL HIGH (ref 11.5–15.5)
WBC: 14.6 10*3/uL — AB (ref 4.0–10.5)

## 2014-06-16 LAB — COMPREHENSIVE METABOLIC PANEL
ALBUMIN: 3.5 g/dL (ref 3.5–5.2)
ALT: 19 U/L (ref 0–35)
ANION GAP: 8 (ref 5–15)
AST: 19 U/L (ref 0–37)
Alkaline Phosphatase: 75 U/L (ref 39–117)
BUN: 36 mg/dL — AB (ref 6–23)
CALCIUM: 9.1 mg/dL (ref 8.4–10.5)
CHLORIDE: 105 mmol/L (ref 96–112)
CO2: 27 mmol/L (ref 19–32)
CREATININE: 1.56 mg/dL — AB (ref 0.50–1.10)
GFR calc Af Amer: 33 mL/min — ABNORMAL LOW (ref >90)
GFR calc non Af Amer: 29 mL/min — ABNORMAL LOW (ref >90)
GLUCOSE: 92 mg/dL (ref 70–99)
Potassium: 4.2 mmol/L (ref 3.5–5.1)
Sodium: 140 mmol/L (ref 135–145)
Total Bilirubin: 0.7 mg/dL (ref 0.3–1.2)
Total Protein: 5.6 g/dL — ABNORMAL LOW (ref 6.0–8.3)

## 2014-06-16 LAB — TSH: TSH: 1.146 u[IU]/mL (ref 0.350–4.500)

## 2014-06-16 NOTE — ED Provider Notes (Addendum)
CSN: [id number]     Arrival date & time 06/16/14  1355 History   First MD Initiated Contact with Patient 06/16/14 1717     Chief Complaint  Patient presents with  . Anxiety     (Consider location/radiation/quality/duration/timing/severity/associated sxs/prior Treatment) Patient is a 81 y.o. female presenting with anxiety. The history is provided by the patient.  Anxiety Pertinent negatives include no chest pain, no abdominal pain, no headaches and no shortness of breath.   patient has been feeling strange over the last few days. Has been more tearful. Has been somewhat fatigued also. Had slight confusion also regards for pills, which is not that unusual for her. Denies fever, chest pain denies headache. She just finished up a redness on Dosepak for back pain and also had a cortisone injection by Dr. Nelva Bush around 8 days ago. She has had prednisone once in the past without difficulty.  Past Medical History  Diagnosis Date  . Essential hypertension   . Hypothyroidism   . Hyperlipidemia   . GERD (gastroesophageal reflux disease)   . Anemia    Past Surgical History  Procedure Laterality Date  . Total vaginal hysterectomy    . Hernia repair    . Appendectomy     Family History  Problem Relation Age of Onset  . Diabetes    . Heart disease    . Kidney disease    . Stroke Mother   . Heart attack Father   . Heart attack Maternal Grandmother   . Heart attack Maternal Grandfather   . Heart attack Paternal Grandmother   . Heart attack Paternal Grandfather   . Heart attack Brother   . Cancer Sister    History  Substance Use Topics  . Smoking status: Never Smoker   . Smokeless tobacco: Never Used  . Alcohol Use: No   OB History    No data available     Review of Systems  Constitutional: Negative for activity change and appetite change.  Eyes: Negative for pain.  Respiratory: Negative for chest tightness and shortness of breath.   Cardiovascular: Negative for chest pain and  leg swelling.  Gastrointestinal: Negative for nausea, vomiting, abdominal pain and diarrhea.  Genitourinary: Negative for dysuria and flank pain.  Musculoskeletal: Positive for back pain. Negative for neck stiffness.  Skin: Negative for rash.  Neurological: Negative for weakness, numbness and headaches.  Psychiatric/Behavioral: Negative for behavioral problems. The patient is nervous/anxious.       Allergies  Sulfa antibiotics  Home Medications   Prior to Admission medications   Medication Sig Start Date End Date Taking? Authorizing Provider  amLODipine (NORVASC) 5 MG tablet Take 5 mg by mouth daily.    Historical Provider, MD  aspirin 81 MG tablet Take 81 mg by mouth daily.    Historical Provider, MD  atorvastatin (LIPITOR) 20 MG tablet Take 20 mg by mouth daily. 03/23/14 03/23/15  Historical Provider, MD  DULoxetine (CYMBALTA) 60 MG capsule Take 30 mg by mouth daily.     Historical Provider, MD  ergocalciferol (VITAMIN D2) 50000 UNITS capsule Take 50,000 Units by mouth once a week.    Historical Provider, MD  levothyroxine (SYNTHROID, LEVOTHROID) 25 MCG tablet Take 25 mcg by mouth daily before breakfast.    Historical Provider, MD  LORazepam (ATIVAN) 0.5 MG tablet Take 0.5 mg by mouth as needed for anxiety.    Historical Provider, MD   BP 112/72 mmHg  Pulse 79  Temp(Src) 97.4 F (36.3 C) (Oral)  Resp 27  SpO2 99% Physical Exam  Constitutional: She is oriented to person, place, and time. She appears well-developed and well-nourished.  HENT:  Head: Normocephalic and atraumatic.  Eyes: EOM are normal. Pupils are equal, round, and reactive to light.  Neck: Normal range of motion. Neck supple.  Cardiovascular: Normal rate, regular rhythm and normal heart sounds.   No murmur heard. Pulmonary/Chest: Effort normal and breath sounds normal. No respiratory distress. She has no wheezes. She has no rales.  Abdominal: Soft. Bowel sounds are normal. She exhibits no distension. There is no  tenderness. There is no rebound and no guarding.  Musculoskeletal: Normal range of motion.  Neurological: She is alert and oriented to person, place, and time. No cranial nerve deficit.  Slight confusion, defers to daughter to answer many questions. Near baseline per family member.  Skin: Skin is warm and dry.  Psychiatric: She has a normal mood and affect. Her speech is normal.  Nursing note and vitals reviewed.   ED Course  Procedures (including critical care time) Labs Review Labs Reviewed  CBC - Abnormal; Notable for the following:    WBC 14.6 (*)    RDW 15.7 (*)    All other components within normal limits  COMPREHENSIVE METABOLIC PANEL - Abnormal; Notable for the following:    BUN 36 (*)    Creatinine, Ser 1.56 (*)    Total Protein 5.6 (*)    GFR calc non Af Amer 29 (*)    GFR calc Af Amer 33 (*)    All other components within normal limits  URINALYSIS, ROUTINE W REFLEX MICROSCOPIC - Abnormal; Notable for the following:    Color, Urine AMBER (*)    Bilirubin Urine SMALL (*)    Ketones, ur 15 (*)    Leukocytes, UA SMALL (*)    All other components within normal limits  URINE MICROSCOPIC-ADD ON - Abnormal; Notable for the following:    Squamous Epithelial / LPF FEW (*)    Casts HYALINE CASTS (*)    All other components within normal limits  TSH    Imaging Review No results found.   EKG Interpretation   Date/Time:  Wednesday June 16 2014 18:10:16 EDT Ventricular Rate:  75 PR Interval:  146 QRS Duration: 76 QT Interval:  405 QTC Calculation: 452 R Axis:   -32 Text Interpretation:  Sinus rhythm Atrial premature complexes in couplets  Probable anterolateral infarct, old Confirmed by Shonica Weier  MD, Yajahira Tison  [id number]) on 06/16/2014 7:49:02 PM      MDM   Final diagnoses:  Medication reaction, initial encounter    Patient was feeling bad and had some confusion. May be related to recent steroids. Nonfocal exam. Lab work reassuring. Will discharge home. Creatinine  is mildly elevated and will need to be followed. Patient has been informed of the elevation of creatinine    Davonna Belling, MD 06/16/14 1951  Patient did have some sats documented in the 80s, however does not appear accurate. There was not a good waveform on the monitor.    Davonna Belling, MD 06/16/14 [phone]

## 2014-06-16 NOTE — Discharge Instructions (Signed)
The confusion may repeat be related to the prednisone. Follow-up with your primary care doctor.

## 2014-06-16 NOTE — ED Notes (Signed)
Phlebotomy at the bedside  

## 2014-06-16 NOTE — ED Notes (Signed)
Pt here with DIL. States she woke up this morning at 0900 then went back to bed and woke up at 1000 and took her medications.. Has just finished a prednisone dose pack Monday and has been having a rough few days where she has been shakey and anxious and nervous, crying a lot.

## 2014-06-23 ENCOUNTER — Ambulatory Visit (HOSPITAL_COMMUNITY)
Admission: RE | Admit: 2014-06-23 | Discharge: 2014-06-23 | Disposition: A | Discharge status: Home / Self Care | Payer: Medicare Other | Source: Ambulatory Visit | Attending: Internal Medicine | Admitting: Internal Medicine

## 2014-06-23 DIAGNOSIS — I10 Essential (primary) hypertension: Secondary | ICD-10-CM | POA: Insufficient documentation

## 2014-07-05 ENCOUNTER — Telehealth: Payer: Self-pay | Admitting: Cardiovascular Disease

## 2014-07-05 NOTE — Telephone Encounter (Signed)
Pt would like her test results from 06-23-14 please.

## 2014-07-05 NOTE — Telephone Encounter (Signed)
Line busy when dialed. 

## 2014-07-06 NOTE — Telephone Encounter (Signed)
Spoke with patient and advised of AAA results. She stated concern over info she was told by Dr. Nelva Bush that she had plaque in her abdominal aorta - noted mild atherosclerosis. Advised patient she is on appropriate medications of statin and asa. She states all the family members on her dad's side died of MI or stroke. Advised patient she can request a repeat test in one year if she is still concerned. She voiced understanding.

## 2014-09-09 ENCOUNTER — Ambulatory Visit (INDEPENDENT_AMBULATORY_CARE_PROVIDER_SITE_OTHER): Payer: Medicare Other | Admitting: Otolaryngology

## 2014-09-09 DIAGNOSIS — H903 Sensorineural hearing loss, bilateral: Secondary | ICD-10-CM

## 2014-09-21 ENCOUNTER — Telehealth: Payer: Self-pay | Admitting: Cardiology

## 2014-09-21 NOTE — Telephone Encounter (Signed)
Going to another doctor

## 2015-04-05 ENCOUNTER — Other Ambulatory Visit (HOSPITAL_COMMUNITY): Payer: Self-pay | Admitting: Family Medicine

## 2015-04-05 DIAGNOSIS — R200 Anesthesia of skin: Secondary | ICD-10-CM

## 2015-04-05 DIAGNOSIS — R42 Dizziness and giddiness: Secondary | ICD-10-CM

## 2015-04-06 ENCOUNTER — Other Ambulatory Visit: Payer: Self-pay | Admitting: Obstetrics and Gynecology

## 2015-04-08 ENCOUNTER — Ambulatory Visit (HOSPITAL_BASED_OUTPATIENT_CLINIC_OR_DEPARTMENT_OTHER)
Admission: RE | Admit: 2015-04-08 | Discharge: 2015-04-08 | Disposition: A | Discharge status: Home / Self Care | Payer: Medicare Other | Source: Ambulatory Visit | Attending: Family Medicine | Admitting: Family Medicine

## 2015-04-08 ENCOUNTER — Ambulatory Visit (HOSPITAL_COMMUNITY)
Admission: RE | Admit: 2015-04-08 | Discharge: 2015-04-08 | Disposition: A | Discharge status: Home / Self Care | Payer: Medicare Other | Source: Ambulatory Visit | Attending: Family Medicine | Admitting: Family Medicine

## 2015-04-08 DIAGNOSIS — R200 Anesthesia of skin: Secondary | ICD-10-CM

## 2015-04-08 DIAGNOSIS — G319 Degenerative disease of nervous system, unspecified: Secondary | ICD-10-CM | POA: Insufficient documentation

## 2015-04-08 DIAGNOSIS — R42 Dizziness and giddiness: Secondary | ICD-10-CM | POA: Diagnosis present

## 2015-04-08 DIAGNOSIS — I351 Nonrheumatic aortic (valve) insufficiency: Secondary | ICD-10-CM | POA: Insufficient documentation

## 2015-04-08 DIAGNOSIS — R9082 White matter disease, unspecified: Secondary | ICD-10-CM | POA: Diagnosis not present

## 2015-04-08 DIAGNOSIS — I340 Nonrheumatic mitral (valve) insufficiency: Secondary | ICD-10-CM

## 2015-04-08 DIAGNOSIS — R531 Weakness: Secondary | ICD-10-CM | POA: Insufficient documentation

## 2015-06-01 ENCOUNTER — Ambulatory Visit: Payer: Medicare Other | Admitting: Gastroenterology

## 2015-08-12 ENCOUNTER — Other Ambulatory Visit: Payer: Self-pay | Admitting: Neurosurgery

## 2015-08-12 DIAGNOSIS — M5412 Radiculopathy, cervical region: Secondary | ICD-10-CM

## 2015-08-24 ENCOUNTER — Other Ambulatory Visit: Payer: Self-pay | Admitting: Neurosurgery

## 2015-08-24 ENCOUNTER — Ambulatory Visit
Admission: RE | Admit: 2015-08-24 | Discharge: 2015-08-24 | Disposition: A | Discharge status: Home / Self Care | Payer: Medicare Other | Source: Ambulatory Visit | Attending: Neurosurgery | Admitting: Neurosurgery

## 2015-08-24 DIAGNOSIS — M5412 Radiculopathy, cervical region: Secondary | ICD-10-CM

## 2015-08-24 MED ORDER — TRIAMCINOLONE ACETONIDE 40 MG/ML IJ SUSP (RADIOLOGY)
60.0000 mg | Freq: Once | INTRAMUSCULAR | Status: AC
  Administered 2015-08-24: 60 mg via EPIDURAL

## 2015-08-24 MED ORDER — IOHEXOL 300 MG/ML  SOLN
1.0000 mL | Freq: Once | INTRAMUSCULAR | Status: AC | PRN
  Administered 2015-08-24: 1 mL via EPIDURAL

## 2015-08-24 NOTE — Discharge Instructions (Signed)

## 2015-11-19 ENCOUNTER — Emergency Department (HOSPITAL_COMMUNITY): Payer: Medicare Other

## 2015-11-19 ENCOUNTER — Encounter (HOSPITAL_COMMUNITY): Payer: Self-pay | Admitting: Emergency Medicine

## 2015-11-19 ENCOUNTER — Observation Stay (HOSPITAL_COMMUNITY)
Admission: EM | Admit: 2015-11-19 | Discharge: 2015-11-21 | Disposition: A | Discharge status: Home / Self Care | Payer: Medicare Other | Attending: Internal Medicine | Admitting: Internal Medicine

## 2015-11-19 DIAGNOSIS — Z7982 Long term (current) use of aspirin: Secondary | ICD-10-CM | POA: Diagnosis not present

## 2015-11-19 DIAGNOSIS — K5792 Diverticulitis of intestine, part unspecified, without perforation or abscess without bleeding: Secondary | ICD-10-CM | POA: Diagnosis present

## 2015-11-19 DIAGNOSIS — K5732 Diverticulitis of large intestine without perforation or abscess without bleeding: Secondary | ICD-10-CM | POA: Diagnosis not present

## 2015-11-19 DIAGNOSIS — I10 Essential (primary) hypertension: Secondary | ICD-10-CM | POA: Diagnosis not present

## 2015-11-19 DIAGNOSIS — N183 Chronic kidney disease, stage 3 unspecified: Secondary | ICD-10-CM | POA: Diagnosis present

## 2015-11-19 DIAGNOSIS — Z79899 Other long term (current) drug therapy: Secondary | ICD-10-CM | POA: Diagnosis not present

## 2015-11-19 DIAGNOSIS — R531 Weakness: Secondary | ICD-10-CM | POA: Diagnosis present

## 2015-11-19 DIAGNOSIS — F0390 Unspecified dementia without behavioral disturbance: Secondary | ICD-10-CM | POA: Diagnosis present

## 2015-11-19 DIAGNOSIS — N390 Urinary tract infection, site not specified: Secondary | ICD-10-CM | POA: Diagnosis not present

## 2015-11-19 DIAGNOSIS — E876 Hypokalemia: Secondary | ICD-10-CM | POA: Diagnosis present

## 2015-11-19 DIAGNOSIS — E039 Hypothyroidism, unspecified: Secondary | ICD-10-CM | POA: Diagnosis not present

## 2015-11-19 DIAGNOSIS — F39 Unspecified mood [affective] disorder: Secondary | ICD-10-CM | POA: Diagnosis present

## 2015-11-19 LAB — URINALYSIS, ROUTINE W REFLEX MICROSCOPIC
GLUCOSE, UA: NEGATIVE mg/dL
Ketones, ur: NEGATIVE mg/dL
LEUKOCYTES UA: NEGATIVE
NITRITE: NEGATIVE
PROTEIN: 30 mg/dL — AB
Specific Gravity, Urine: 1.026 (ref 1.005–1.030)
pH: 6 (ref 5.0–8.0)

## 2015-11-19 LAB — I-STAT CHEM 8, ED
BUN: 25 mg/dL — AB (ref 6–20)
CALCIUM ION: 1.14 mmol/L — AB (ref 1.15–1.40)
CHLORIDE: 98 mmol/L — AB (ref 101–111)
CREATININE: 1.3 mg/dL — AB (ref 0.44–1.00)
GLUCOSE: 116 mg/dL — AB (ref 65–99)
HCT: 40 % (ref 36.0–46.0)
Hemoglobin: 13.6 g/dL (ref 12.0–15.0)
POTASSIUM: 3.1 mmol/L — AB (ref 3.5–5.1)
Sodium: 136 mmol/L (ref 135–145)
TCO2: 25 mmol/L (ref 0–100)

## 2015-11-19 LAB — COMPREHENSIVE METABOLIC PANEL
ALBUMIN: 4.3 g/dL (ref 3.5–5.0)
ALT: 15 U/L (ref 14–54)
AST: 22 U/L (ref 15–41)
Alkaline Phosphatase: 68 U/L (ref 38–126)
Anion gap: 11 (ref 5–15)
BILIRUBIN TOTAL: 0.7 mg/dL (ref 0.3–1.2)
BUN: 26 mg/dL — AB (ref 6–20)
CHLORIDE: 100 mmol/L — AB (ref 101–111)
CO2: 24 mmol/L (ref 22–32)
CREATININE: 1.17 mg/dL — AB (ref 0.44–1.00)
Calcium: 9.7 mg/dL (ref 8.9–10.3)
GFR calc Af Amer: 46 mL/min — ABNORMAL LOW (ref >60)
GFR calc non Af Amer: 40 mL/min — ABNORMAL LOW (ref >60)
Glucose, Bld: 118 mg/dL — ABNORMAL HIGH (ref 65–99)
POTASSIUM: 3.1 mmol/L — AB (ref 3.5–5.1)
Sodium: 135 mmol/L (ref 135–145)
Total Protein: 7.5 g/dL (ref 6.5–8.1)

## 2015-11-19 LAB — POC OCCULT BLOOD, ED: Fecal Occult Bld: NEGATIVE

## 2015-11-19 LAB — CBC WITH DIFFERENTIAL/PLATELET
BASOS ABS: 0 10*3/uL (ref 0.0–0.1)
BASOS PCT: 0 %
Eosinophils Absolute: 0.1 10*3/uL (ref 0.0–0.7)
Eosinophils Relative: 0 %
HEMATOCRIT: 38.6 % (ref 36.0–46.0)
Hemoglobin: 12.8 g/dL (ref 12.0–15.0)
LYMPHS PCT: 8 %
Lymphs Abs: 1.4 10*3/uL (ref 0.7–4.0)
MCH: 28.8 pg (ref 26.0–34.0)
MCHC: 33.2 g/dL (ref 30.0–36.0)
MCV: 86.7 fL (ref 78.0–100.0)
MONO ABS: 1.7 10*3/uL — AB (ref 0.1–1.0)
Monocytes Relative: 9 %
NEUTROS ABS: 15.4 10*3/uL — AB (ref 1.7–7.7)
Neutrophils Relative %: 83 %
PLATELETS: 320 10*3/uL (ref 150–400)
RBC: 4.45 MIL/uL (ref 3.87–5.11)
RDW: 14.3 % (ref 11.5–15.5)
WBC: 18.6 10*3/uL — AB (ref 4.0–10.5)

## 2015-11-19 LAB — LIPASE, BLOOD: Lipase: 22 U/L (ref 11–51)

## 2015-11-19 LAB — URINE MICROSCOPIC-ADD ON

## 2015-11-19 MED ORDER — METRONIDAZOLE IN NACL 5-0.79 MG/ML-% IV SOLN
500.0000 mg | Freq: Once | INTRAVENOUS | Status: DC
  Filled 2015-11-19: qty 100

## 2015-11-19 MED ORDER — CIPROFLOXACIN IN D5W 400 MG/200ML IV SOLN
400.0000 mg | Freq: Once | INTRAVENOUS | Status: DC
  Administered 2015-11-20: 400 mg via INTRAVENOUS
  Filled 2015-11-19: qty 200

## 2015-11-19 MED ORDER — SODIUM CHLORIDE 0.9 % IV BOLUS (SEPSIS)
500.0000 mL | Freq: Once | INTRAVENOUS | Status: AC
  Administered 2015-11-19: 500 mL via INTRAVENOUS

## 2015-11-19 MED ORDER — DEXTROSE 5 % IV SOLN
1.0000 g | Freq: Once | INTRAVENOUS | Status: AC
  Administered 2015-11-19: 1 g via INTRAVENOUS
  Filled 2015-11-19: qty 10

## 2015-11-19 MED ORDER — IOPAMIDOL (ISOVUE-300) INJECTION 61%
30.0000 mL | Freq: Once | INTRAVENOUS | Status: AC | PRN
  Administered 2015-11-19: 30 mL via ORAL

## 2015-11-19 MED ORDER — IOPAMIDOL (ISOVUE-300) INJECTION 61%
100.0000 mL | Freq: Once | INTRAVENOUS | Status: AC | PRN
  Administered 2015-11-19: 80 mL via INTRAVENOUS

## 2015-11-19 NOTE — ED Notes (Signed)
Patient returned from bathroom and had hit her right posterior wrist area where PIV was located, area bruised and bleeding-IV removed and restarted. Rectal exam and POC hemoccult obtained by Elmyra Ricks EDPA

## 2015-11-19 NOTE — ED Triage Notes (Signed)
Pt was diagnosed with a UTI 3 days ago. Pt was prescribed Macrobid 100mg  BID.  Pt c/o weakness, has not been eating in the last couple days, and diarrhea "just liquid". Pt's family called the pharmacist and he informed them to have her stop taking the Mx and come to ED.

## 2015-11-19 NOTE — ED Provider Notes (Signed)
[facility] DEPT Provider Note   CSN: [id number] Arrival date & time: 11/19/15  1842     History   Chief Complaint Chief Complaint  Patient presents with  . Urinary Tract Infection    HPI   Blood pressure 163/89, pulse 84, temperature 97.9 F (36.6 C), temperature source Oral, resp. rate 18, height 5\' 3"  (1.6 m), weight 52.6 kg, SpO2 97 %.  Deborah Rivers is a 82 y.o. female with past medical history significant for dementia, hyperlipidemia, hypothyroid brought in for evaluation of urinary tract infection she's had dysuria for approximately 1 week, she had 5 tabs of Macrobid and DC'd it 48 hours ago secondary to adverse reaction of diarrhea, of note she also has recently started taking a prune pill for constipation and her daughter-in-law who supplies most of the history states that she has chronic constipation secondary to poor by mouth intake and hydration. She denies fevers, chills, nausea, vomiting. She reports a left lower quadrant abdominal pain which she reports is colicky and moderate, is Her from sleep the last 2 nights. She states she has had colonoscopy in the past and has not had reported diverticulosis. She reports that she's had watery stool with a small amount of dark stool. She denies chest pain, cough, shortness of breath, palpitations, presyncope. She has a history of frequent UTIs.   HPI  Past Medical History:  Diagnosis Date  . Anemia   . Essential hypertension   . GERD (gastroesophageal reflux disease)   . Hyperlipidemia   . Hypothyroidism     Patient Active Problem List   Diagnosis Date Noted  . Diverticulitis 11/20/2015  . Aortic calcification ([place]) 02/26/2014  . Essential hypertension 02/26/2014  . Mixed hyperlipidemia 02/26/2014  . Absolute anemia 07/20/2013  . Infection of urinary tract 06/22/2013  . Back ache 06/22/2013    Past Surgical History:  Procedure Laterality Date  . APPENDECTOMY    . HERNIA REPAIR    . TOTAL VAGINAL  HYSTERECTOMY      OB History    No data available       Home Medications    Prior to Admission medications   Medication Sig Start Date End Date Taking? Authorizing Provider  amLODipine (NORVASC) 5 MG tablet Take 5 mg by mouth daily.   Yes Historical Provider, MD  ARIPiprazole (ABILIFY) 5 MG tablet Take 5 mg by mouth daily.  11/08/15  Yes Historical Provider, MD  aspirin 81 MG tablet Take 81 mg by mouth daily.   Yes Historical Provider, MD  atorvastatin (LIPITOR) 20 MG tablet Take 20 mg by mouth daily at 6 PM.  10/10/15  Yes Historical Provider, MD  DULoxetine (CYMBALTA) 30 MG capsule Take 30 mg by mouth daily. Take along with 60 mg tablet=90 mg   Yes Historical Provider, MD  DULoxetine (CYMBALTA) 60 MG capsule Take 60 mg by mouth daily. Take along with 30 mg tablet=90 mg   Yes Historical Provider, MD  levothyroxine (SYNTHROID, LEVOTHROID) 25 MCG tablet Take 25 mcg by mouth daily before breakfast.   Yes Historical Provider, MD  nitrofurantoin, macrocrystal-monohydrate, (MACROBID) 100 MG capsule Take 100 mg by mouth 2 (two) times daily. ABT Start Date 11/16/15 & End Date 11/26/15. 11/16/15  Yes Historical Provider, MD  ergocalciferol (VITAMIN D2) 50000 UNITS capsule Take 50,000 Units by mouth once a week. Mon    Historical Provider, MD  LORazepam (ATIVAN) 0.5 MG tablet Take 0.25 mg by mouth at bedtime as needed for anxiety.     Historical  Provider, MD  Misc. Devices MISC  06/30/15   Historical Provider, MD  triamcinolone (NASACORT AQ) 55 MCG/ACT AERO nasal inhaler Place 1 spray into the nose daily as needed (allergies).  07/21/15 07/20/16  Historical Provider, MD    Family History Family History  Problem Relation Age of Onset  . Stroke Mother   . Heart attack Father   . Heart attack Maternal Grandmother   . Heart attack Maternal Grandfather   . Heart attack Paternal Grandmother   . Heart attack Paternal Grandfather   . Diabetes    . Heart disease    . Kidney disease    . Heart attack  Brother   . Cancer Sister     Social History Social History  Substance Use Topics  . Smoking status: Never Smoker  . Smokeless tobacco: Never Used  . Alcohol use No     Allergies   Sulfa antibiotics   Review of Systems Review of Systems   Physical Exam Updated Vital Signs BP 137/99   Pulse 117   Temp 97.9 F (36.6 C) (Oral)   Resp 18   Ht 5\' 3"  (1.6 m)   Wt 52.6 kg   SpO2 (!) 86%   BMI 20.55 kg/m   Physical Exam  Constitutional: She is oriented to person, place, and time. She appears well-developed and well-nourished. No distress.  HENT:  Head: Normocephalic and atraumatic.  Mouth/Throat: Oropharynx is clear and moist.  Eyes: Conjunctivae and EOM are normal. Pupils are equal, round, and reactive to light.  Neck: Normal range of motion.  Cardiovascular: Regular rhythm and intact distal pulses.   Tachycardic  Pulmonary/Chest: Effort normal and breath sounds normal. No respiratory distress. She has no wheezes. She has no rales. She exhibits no tenderness.  Abdominal: Soft. She exhibits no distension and no mass. There is tenderness. There is no rebound and no guarding. No hernia.  Tender in the suprapubic and left lower quadrant with no guarding or rebound.  Musculoskeletal: Normal range of motion.  Neurological: She is alert and oriented to person, place, and time.  Skin: She is not diaphoretic.  Psychiatric: She has a normal mood and affect.  Nursing note and vitals reviewed.    ED Treatments / Results  Labs (all labs ordered are listed, but only abnormal results are displayed) Labs Reviewed  URINALYSIS, ROUTINE W REFLEX MICROSCOPIC (NOT AT [facility]) - Abnormal; Notable for the following:       Result Value   Color, Urine AMBER (*)    Hgb urine dipstick TRACE (*)    Bilirubin Urine SMALL (*)    Protein, ur 30 (*)    All other components within normal limits  URINE MICROSCOPIC-ADD ON - Abnormal; Notable for the following:    Squamous Epithelial / LPF 0-5  (*)    Bacteria, UA RARE (*)    Casts HYALINE CASTS (*)    All other components within normal limits  CBC WITH DIFFERENTIAL/PLATELET - Abnormal; Notable for the following:    WBC 18.6 (*)    Neutro Abs 15.4 (*)    Monocytes Absolute 1.7 (*)    All other components within normal limits  COMPREHENSIVE METABOLIC PANEL - Abnormal; Notable for the following:    Potassium 3.1 (*)    Chloride 100 (*)    Glucose, Bld 118 (*)    BUN 26 (*)    Creatinine, Ser 1.17 (*)    GFR calc non Af Amer 40 (*)    GFR calc Af Wyvonnia Lora  46 (*)    All other components within normal limits  I-STAT CHEM 8, ED - Abnormal; Notable for the following:    Potassium 3.1 (*)    Chloride 98 (*)    BUN 25 (*)    Creatinine, Ser 1.30 (*)    Glucose, Bld 116 (*)    Calcium, Ion 1.14 (*)    All other components within normal limits  GASTROINTESTINAL PANEL BY PCR, STOOL (REPLACES STOOL CULTURE)  URINE CULTURE  C DIFFICILE QUICK SCREEN W PCR REFLEX  CULTURE, BLOOD (ROUTINE X 2)  CULTURE, BLOOD (ROUTINE X 2)  LIPASE, BLOOD  POC OCCULT BLOOD, ED  I-STAT CG4 LACTIC ACID, ED    EKG  EKG Interpretation None       Radiology Ct Abdomen Pelvis W Contrast  Result Date: 11/19/2015 CLINICAL DATA:  Left lower quadrant pain. Loss of appetite. Weakness. Diarrhea. Recent diagnosis of urinary tract infection. White cell count 18.6. EXAM: CT ABDOMEN AND PELVIS WITH CONTRAST TECHNIQUE: Multidetector CT imaging of the abdomen and pelvis was performed using the standard protocol following bolus administration of intravenous contrast. CONTRAST:  74mL ISOVUE-300 IOPAMIDOL (ISOVUE-300) INJECTION 61%, 57mL ISOVUE-300 IOPAMIDOL (ISOVUE-300) INJECTION 61% COMPARISON:  01/02/2014 FINDINGS: Lower chest: Linear atelectasis or fibrosis in the lung bases. Moderate-sized esophageal hiatal hernia. Thickening of the wall of the distal esophagus may represent reflux disease. Hepatobiliary: Mild diffuse fatty infiltration of the liver. Gallbladder  and bile ducts are unremarkable. Pancreas: Unremarkable. No pancreatic ductal dilatation or surrounding inflammatory changes. Spleen: Normal in size without focal abnormality. Adrenals/Urinary Tract: No adrenal gland nodules. Renal nephrograms are symmetrical. No solid mass or hydronephrosis in either kidney. Bladder wall is not thickened. Stomach/Bowel: Stomach, small bowel, and colon are not abnormally distended. Diffuse stool throughout the colon. Diverticulosis of the sigmoid colon. There is inflammatory stranding in the fat around the sigmoid colon. Changes are consistent with acute diverticulitis. No abscess. There is a fairly abrupt change in caliber between the sigmoid colon and the rectum. Recommend evaluation of the colon after resolution of the acute process to exclude an underlying colon cancer. Vascular/Lymphatic: Aortic atherosclerosis. No enlarged abdominal or pelvic lymph nodes. Reproductive: Status post hysterectomy. No adnexal masses. Other: No abdominal wall hernia or abnormality. No abdominopelvic ascites. Musculoskeletal: Degenerative changes in the lumbar spine. No destructive bone lesions. IMPRESSION: Diverticulosis of the sigmoid colon with inflammatory changes consistent with acute diverticulitis. No abscess. There is an abrupt change in caliber between sigmoid colon and the rectum. Recommend evaluation of the colon after resolution of the acute process to exclude underlying colon cancer. Diffuse fatty infiltration of the liver. Moderate-sized esophageal hiatal hernia with possible esophageal reflux disease. Electronically Signed   By: Lucienne Capers M.D.   On: 11/19/2015 23:42    Procedures Procedures (including critical care time)  Medications Ordered in ED Medications  metroNIDAZOLE (FLAGYL) IVPB 500 mg (not administered)  ciprofloxacin (CIPRO) IVPB 400 mg (not administered)  sodium chloride 0.9 % bolus 500 mL (0 mLs Intravenous Stopped 11/19/15 2312)  cefTRIAXone (ROCEPHIN) 1  g in dextrose 5 % 50 mL IVPB (0 g Intravenous Stopped 11/19/15 2313)  iopamidol (ISOVUE-300) 61 % injection 100 mL (80 mLs Intravenous Contrast Given 11/19/15 2317)  iopamidol (ISOVUE-300) 61 % injection 30 mL (30 mLs Oral Contrast Given 11/19/15 2318)     Initial Impression / Assessment and Plan / ED Course  I have reviewed the triage vital signs and the nursing notes.  Pertinent labs & imaging results that were available during  my care of the patient were reviewed by me and considered in my medical decision making (see chart for details).  Clinical Course    Vitals:   11/19/15 1900 11/19/15 1919 11/19/15 2230 11/19/15 2300  BP:  163/89 162/94 137/99  Pulse:  84 95 117  Resp:  18    Temp:  97.9 F (36.6 C)    TempSrc:  Oral    SpO2:  97% 94% (!) 86%  Weight: 52.6 kg     Height: 5\' 3"  (1.6 m)       Medications  metroNIDAZOLE (FLAGYL) IVPB 500 mg (not administered)  ciprofloxacin (CIPRO) IVPB 400 mg (not administered)  sodium chloride 0.9 % bolus 500 mL (0 mLs Intravenous Stopped 11/19/15 2312)  cefTRIAXone (ROCEPHIN) 1 g in dextrose 5 % 50 mL IVPB (0 g Intravenous Stopped 11/19/15 2313)  iopamidol (ISOVUE-300) 61 % injection 100 mL (80 mLs Intravenous Contrast Given 11/19/15 2317)  iopamidol (ISOVUE-300) 61 % injection 30 mL (30 mLs Oral Contrast Given 11/19/15 2318)    Deborah Rivers is 82 y.o. female presenting with Urinary tract infection, think she had an adverse reaction to [facility], she had only several days of the antibiotic and then interpreted her loose stool to be secondary to [place]. She is also taking prune pills. On my exam she has left lower quadrant pain which she rates as moderate however, it is Her from sleep for 2 nights now. Concern for diverticulitis, will CT abdomen pelvis. Patient declines pain medication at this time, abdominal exam is nonsurgical.  Patient with leukocytosis, guaiac negative. Repeat abdominal exam unchanged.  Urinalysis consistent with  infection, urine culture pending, patient given gram of Rocephin.  CT abdomen pelvis shows uncomplicated diverticulitis. Abrupt change in caliber of the sigmoid colon, recommend colonoscopy after infection is cleared.  Given her discomfort, tachycardia, UTI and diverticulitis will need inpatient admission.  This is a shared visit with the attending physician who personally evaluated the patient and agrees with the care plan.   Will be admitted to [facility] hospitalist Dr. Myna Hidalgo, discussed the need for follow-up colonoscopy after infection is cleared.    Final Clinical Impressions(s) / ED Diagnoses   Final diagnoses:  Diverticulitis of large intestine without perforation or abscess without bleeding  UTI (lower urinary tract infection)    New Prescriptions New Prescriptions   No medications on file     Monico Blitz, PA-C 11/20/15 0008    Fredia Sorrow, MD 11/20/15 1707

## 2015-11-19 NOTE — ED Provider Notes (Signed)
Medical screening examination/treatment/procedure(s) were conducted as a shared visit with non-physician practitioner(s) and myself.  I personally evaluated the patient during the encounter.   EKG Interpretation None       Results for orders placed or performed during the hospital encounter of 11/19/15  Urinalysis, Routine w reflex microscopic- may I&O cath if menses  Result Value Ref Range   Color, Urine AMBER (A) YELLOW   APPearance CLEAR CLEAR   Specific Gravity, Urine 1.026 1.005 - 1.030   pH 6.0 5.0 - 8.0   Glucose, UA NEGATIVE NEGATIVE mg/dL   Hgb urine dipstick TRACE (A) NEGATIVE   Bilirubin Urine SMALL (A) NEGATIVE   Ketones, ur NEGATIVE NEGATIVE mg/dL   Protein, ur 30 (A) NEGATIVE mg/dL   Nitrite NEGATIVE NEGATIVE   Leukocytes, UA NEGATIVE NEGATIVE  Urine microscopic-add on  Result Value Ref Range   Squamous Epithelial / LPF 0-5 (A) NONE SEEN   WBC, UA 0-5 0 - 5 WBC/hpf   RBC / HPF 0-5 0 - 5 RBC/hpf   Bacteria, UA RARE (A) NONE SEEN   Casts HYALINE CASTS (A) NEGATIVE  CBC with Differential  Result Value Ref Range   WBC 18.6 (H) 4.0 - 10.5 K/uL   RBC 4.45 3.87 - 5.11 MIL/uL   Hemoglobin 12.8 12.0 - 15.0 g/dL   HCT 38.6 36.0 - 46.0 %   MCV 86.7 78.0 - 100.0 fL   MCH 28.8 26.0 - 34.0 pg   MCHC 33.2 30.0 - 36.0 g/dL   RDW 14.3 11.5 - 15.5 %   Platelets 320 150 - 400 K/uL   Neutrophils Relative % 83 %   Neutro Abs 15.4 (H) 1.7 - 7.7 K/uL   Lymphocytes Relative 8 %   Lymphs Abs 1.4 0.7 - 4.0 K/uL   Monocytes Relative 9 %   Monocytes Absolute 1.7 (H) 0.1 - 1.0 K/uL   Eosinophils Relative 0 %   Eosinophils Absolute 0.1 0.0 - 0.7 K/uL   Basophils Relative 0 %   Basophils Absolute 0.0 0.0 - 0.1 K/uL  Comprehensive metabolic panel  Result Value Ref Range   Sodium 135 135 - 145 mmol/L   Potassium 3.1 (L) 3.5 - 5.1 mmol/L   Chloride 100 (L) 101 - 111 mmol/L   CO2 24 22 - 32 mmol/L   Glucose, Bld 118 (H) 65 - 99 mg/dL   BUN 26 (H) 6 - 20 mg/dL   Creatinine, Ser  1.17 (H) 0.44 - 1.00 mg/dL   Calcium 9.7 8.9 - 10.3 mg/dL   Total Protein 7.5 6.5 - 8.1 g/dL   Albumin 4.3 3.5 - 5.0 g/dL   AST 22 15 - 41 U/L   ALT 15 14 - 54 U/L   Alkaline Phosphatase 68 38 - 126 U/L   Total Bilirubin 0.7 0.3 - 1.2 mg/dL   GFR calc non Af Amer 40 (L) >60 mL/min   GFR calc Af Amer 46 (L) >60 mL/min   Anion gap 11 5 - 15  Lipase, blood  Result Value Ref Range   Lipase 22 11 - 51 U/L  POC occult blood, ED Provider will collect  Result Value Ref Range   Fecal Occult Bld NEGATIVE NEGATIVE  I-stat chem 8, ed  Result Value Ref Range   Sodium 136 135 - 145 mmol/L   Potassium 3.1 (L) 3.5 - 5.1 mmol/L   Chloride 98 (L) 101 - 111 mmol/L   BUN 25 (H) 6 - 20 mg/dL   Creatinine, Ser 1.30 (H) 0.44 -  1.00 mg/dL   Glucose, Bld 116 (H) 65 - 99 mg/dL   Calcium, Ion 1.14 (L) 1.15 - 1.40 mmol/L   TCO2 25 0 - 100 mmol/L   Hemoglobin 13.6 12.0 - 15.0 g/dL   HCT 40.0 36.0 - 46.0 %    Patient seen by me along with the physician assistant. Patient has a history of dementia so all information provided by the daughter. Patient was diagnosed with urinary tract infection 3 days ago. Started on Macrobid. Patient with generalized weakness has not been eating the last couple days has had liquid diarrhea. No blood Hemoccult negative as per PAs exam. Labs without significant abnormalities other than some mild hypokalemia with a potassium of 3.1. BUN and creatinine slightly elevated. Lipase is normal so nothing consistent with pancreatitis. Marked leukocytosis with a white blood cell count of 18,000. Disposition will be based largely on the CT scan. Could show evidence of diverticulitis or could show evidence of  C. difficile stool labs have been ordered.      Fredia Sorrow, MD 11/19/15 2330

## 2015-11-20 ENCOUNTER — Encounter (HOSPITAL_COMMUNITY): Payer: Self-pay | Admitting: Family Medicine

## 2015-11-20 DIAGNOSIS — F329 Major depressive disorder, single episode, unspecified: Secondary | ICD-10-CM

## 2015-11-20 DIAGNOSIS — K5732 Diverticulitis of large intestine without perforation or abscess without bleeding: Secondary | ICD-10-CM | POA: Diagnosis not present

## 2015-11-20 DIAGNOSIS — N390 Urinary tract infection, site not specified: Secondary | ICD-10-CM

## 2015-11-20 DIAGNOSIS — N183 Chronic kidney disease, stage 3 unspecified: Secondary | ICD-10-CM | POA: Diagnosis present

## 2015-11-20 DIAGNOSIS — K5792 Diverticulitis of intestine, part unspecified, without perforation or abscess without bleeding: Secondary | ICD-10-CM | POA: Diagnosis present

## 2015-11-20 DIAGNOSIS — I10 Essential (primary) hypertension: Secondary | ICD-10-CM | POA: Diagnosis not present

## 2015-11-20 DIAGNOSIS — F0390 Unspecified dementia without behavioral disturbance: Secondary | ICD-10-CM | POA: Diagnosis present

## 2015-11-20 DIAGNOSIS — E876 Hypokalemia: Secondary | ICD-10-CM

## 2015-11-20 DIAGNOSIS — F39 Unspecified mood [affective] disorder: Secondary | ICD-10-CM | POA: Diagnosis present

## 2015-11-20 LAB — BASIC METABOLIC PANEL
Anion gap: 12 (ref 5–15)
BUN: 21 mg/dL — AB (ref 6–20)
CALCIUM: 8.7 mg/dL — AB (ref 8.9–10.3)
CO2: 21 mmol/L — ABNORMAL LOW (ref 22–32)
CREATININE: 1.12 mg/dL — AB (ref 0.44–1.00)
Chloride: 101 mmol/L (ref 101–111)
GFR calc non Af Amer: 42 mL/min — ABNORMAL LOW (ref >60)
GFR, EST AFRICAN AMERICAN: 49 mL/min — AB (ref >60)
Glucose, Bld: 105 mg/dL — ABNORMAL HIGH (ref 65–99)
Potassium: 3.4 mmol/L — ABNORMAL LOW (ref 3.5–5.1)
SODIUM: 134 mmol/L — AB (ref 135–145)

## 2015-11-20 LAB — MAGNESIUM: Magnesium: 2 mg/dL (ref 1.7–2.4)

## 2015-11-20 LAB — I-STAT CG4 LACTIC ACID, ED: LACTIC ACID, VENOUS: 1.09 mmol/L (ref 0.5–1.9)

## 2015-11-20 MED ORDER — HYDRALAZINE HCL 20 MG/ML IJ SOLN: 10.0000 mg | INTRAMUSCULAR | Status: DC | PRN

## 2015-11-20 MED ORDER — ATORVASTATIN CALCIUM 20 MG PO TABS: 20.0000 mg | ORAL_TABLET | Freq: Every day | ORAL | Status: DC

## 2015-11-20 MED ORDER — ARIPIPRAZOLE 5 MG PO TABS
5.0000 mg | ORAL_TABLET | Freq: Every day | ORAL | Status: DC
  Administered 2015-11-20 – 2015-11-21 (×2): 5 mg via ORAL
  Filled 2015-11-20 (×2): qty 1

## 2015-11-20 MED ORDER — ONDANSETRON HCL 4 MG PO TABS: 4.0000 mg | ORAL_TABLET | Freq: Four times a day (QID) | ORAL | Status: DC | PRN

## 2015-11-20 MED ORDER — POTASSIUM CHLORIDE IN NACL 40-0.9 MEQ/L-% IV SOLN
INTRAVENOUS | Status: AC
  Filled 2015-11-20: qty 1000

## 2015-11-20 MED ORDER — POTASSIUM CHLORIDE CRYS ER 20 MEQ PO TBCR
20.0000 meq | EXTENDED_RELEASE_TABLET | Freq: Once | ORAL | Status: AC
  Administered 2015-11-20: 20 meq via ORAL
  Filled 2015-11-20: qty 1

## 2015-11-20 MED ORDER — CIPROFLOXACIN IN D5W 400 MG/200ML IV SOLN
400.0000 mg | Freq: Every day | INTRAVENOUS | Status: DC
  Administered 2015-11-20 (×2): 400 mg via INTRAVENOUS
  Filled 2015-11-20: qty 200

## 2015-11-20 MED ORDER — ACETAMINOPHEN 650 MG RE SUPP: 650.0000 mg | Freq: Four times a day (QID) | RECTAL | Status: DC | PRN

## 2015-11-20 MED ORDER — DULOXETINE HCL 30 MG PO CPEP: 30.0000 mg | ORAL_CAPSULE | Freq: Every day | ORAL | Status: DC

## 2015-11-20 MED ORDER — DULOXETINE HCL 30 MG PO CPEP
90.0000 mg | ORAL_CAPSULE | Freq: Every day | ORAL | Status: DC
  Administered 2015-11-20 – 2015-11-21 (×2): 90 mg via ORAL
  Filled 2015-11-20 (×2): qty 3

## 2015-11-20 MED ORDER — ONDANSETRON HCL 4 MG/2ML IJ SOLN: 4.0000 mg | Freq: Four times a day (QID) | INTRAMUSCULAR | Status: DC | PRN

## 2015-11-20 MED ORDER — POTASSIUM CHLORIDE IN NACL 40-0.9 MEQ/L-% IV SOLN
INTRAVENOUS | Status: AC
  Administered 2015-11-20: 85 mL/h via INTRAVENOUS
  Filled 2015-11-20: qty 1000

## 2015-11-20 MED ORDER — LORAZEPAM 0.5 MG PO TABS: 0.2500 mg | ORAL_TABLET | Freq: Every evening | ORAL | Status: DC | PRN

## 2015-11-20 MED ORDER — METRONIDAZOLE IN NACL 5-0.79 MG/ML-% IV SOLN
500.0000 mg | Freq: Three times a day (TID) | INTRAVENOUS | Status: DC
  Administered 2015-11-20 – 2015-11-21 (×5): 500 mg via INTRAVENOUS
  Filled 2015-11-20 (×5): qty 100

## 2015-11-20 MED ORDER — HEPARIN SODIUM (PORCINE) 5000 UNIT/ML IJ SOLN
5000.0000 [IU] | Freq: Three times a day (TID) | INTRAMUSCULAR | Status: DC
  Administered 2015-11-20 – 2015-11-21 (×5): 5000 [IU] via SUBCUTANEOUS
  Filled 2015-11-20 (×5): qty 1

## 2015-11-20 MED ORDER — LEVOTHYROXINE SODIUM 25 MCG PO TABS
25.0000 ug | ORAL_TABLET | Freq: Every day | ORAL | Status: DC
  Administered 2015-11-20 – 2015-11-21 (×2): 25 ug via ORAL
  Filled 2015-11-20 (×2): qty 1

## 2015-11-20 MED ORDER — ATORVASTATIN CALCIUM 10 MG PO TABS
20.0000 mg | ORAL_TABLET | Freq: Every day | ORAL | Status: DC
  Administered 2015-11-20: 20 mg via ORAL
  Filled 2015-11-20: qty 2

## 2015-11-20 MED ORDER — ACETAMINOPHEN 325 MG PO TABS: 650.0000 mg | ORAL_TABLET | Freq: Four times a day (QID) | ORAL | Status: DC | PRN

## 2015-11-20 MED ORDER — HYDROCODONE-ACETAMINOPHEN 5-325 MG PO TABS
1.0000 | ORAL_TABLET | ORAL | Status: DC | PRN
  Administered 2015-11-20 – 2015-11-21 (×2): 1 via ORAL
  Filled 2015-11-20 (×2): qty 1

## 2015-11-20 MED ORDER — AMLODIPINE BESYLATE 5 MG PO TABS
5.0000 mg | ORAL_TABLET | Freq: Every day | ORAL | Status: DC
  Administered 2015-11-20 – 2015-11-21 (×2): 5 mg via ORAL
  Filled 2015-11-20 (×2): qty 1

## 2015-11-20 MED ORDER — ASPIRIN EC 81 MG PO TBEC
81.0000 mg | DELAYED_RELEASE_TABLET | Freq: Every day | ORAL | Status: DC
  Administered 2015-11-20 – 2015-11-21 (×2): 81 mg via ORAL
  Filled 2015-11-20 (×2): qty 1

## 2015-11-20 NOTE — Progress Notes (Signed)
PROGRESS NOTE                                                                                                                                                                                                             Patient Demographics:    Deborah Rivers, is a 82 y.o. female, DOB - 1924-11-15, AN:[id number]  Admit date - 11/19/2015   Admitting Physician Vianne Bulls, MD  Outpatient Primary MD for the patient is Sherrie Mustache, MD  LOS - 0   Chief Complaint  Patient presents with  . Urinary Tract Infection       Brief Narrative   Patient  admitted earlier during the day by my colleague, chart, labs and imaging reviewed, patient admitted for acute diverticulitis.   Subjective:    Elizbeth Squires today has, No headache, No chest pain, Reports abdominal pain is improving, nausea significantly subsided, no vomiting.    Assessment  & Plan :    Principal Problem:   Diverticulitis Active Problems:   Essential hypertension   Depression   Dementia   Hypokalemia   CKD (chronic kidney disease), stage III   UTI (lower urinary tract infection)    Diverticulitis  - Presents with several days of progress abdominal pain, weakness, loss of appetite, and then diarrhea  - She is tender in LLQ and CT findings are consistent with acute diverticulitis; no abscess or perforation identified   - Treatment will be initiated in the hospital given pt age, magnitude of leukocytosis and pain, and dehydration from diarrhea - She was treated with Cipro, Rocephin, and Flagyl in ED, will continue with Cipro and Flagyl    UTI - She was started Macrobid 3 days prior to admission - Urine sent for culture  - Cipro, as above, should suffice while awaiting culture data; no prior positive cultures in EMR   Hypokalemia   - Serum potassium 3.1 on admission, likely secondary to GI-losses  - 40 mEq KCl added to her IVF; 20 mEq K-Dur given   - Magnesium level  pending; replete prn    Hypertension - Mildly elevated on admission; pain is likely contributing  - Continue Norvasc    Depression, anxiety  - Appears to be stable  - Continue Cymbalta, Abilify, and prn Ativan    CKD stage III  - SCr 1.17 on admission, appears  to be at baseline  - Continue IVF hydration given the diarrhea and apparent dehydration   - Repeat chem panel tomorrow   Structural colon abnormality on CT  - Abrupt change in caliber going from sigmoid colon to rectum is noted on CT and concerning for possible mass  - Further evaluation advised once the acute inflammatory process has resolved  , discussed with [facility] to arrange for outpatient colonoscopy   Code Status : Full  Family Communication  : none at bedside  Disposition Plan  : home  Consults  :  None  Procedures  : none  DVT Prophylaxis  :   Heparin - SCDs   Lab Results  Component Value Date   PLT 320 11/19/2015    Antibiotics  :    Anti-infectives    Start     Dose/Rate Route Frequency Ordered Stop   11/20/15 0115  ciprofloxacin (CIPRO) IVPB 400 mg     400 mg 200 mL/hr over 60 Minutes Intravenous Daily at bedtime 11/20/15 0036     11/20/15 0115  metroNIDAZOLE (FLAGYL) IVPB 500 mg     500 mg 100 mL/hr over 60 Minutes Intravenous Every 8 hours 11/20/15 0036     11/20/15 0000  metroNIDAZOLE (FLAGYL) IVPB 500 mg  Status:  Discontinued     500 mg 100 mL/hr over 60 Minutes Intravenous  Once 11/19/15 2355 11/20/15 0102   11/20/15 0000  ciprofloxacin (CIPRO) IVPB 400 mg  Status:  Discontinued     400 mg 200 mL/hr over 60 Minutes Intravenous  Once 11/19/15 2355 11/20/15 0102   11/19/15 2145  cefTRIAXone (ROCEPHIN) 1 g in dextrose 5 % 50 mL IVPB     1 g 100 mL/hr over 30 Minutes Intravenous  Once 11/19/15 2138 11/19/15 2313        Objective:   Vitals:   11/20/15 0000 11/20/15 0032 11/20/15 0129 11/20/15 0602  BP: 162/93 155/77 (!) 178/77 118/72  Pulse: 102 98 95 (!) 104  Resp:   14 16    Temp:   100 F (37.8 C) 98.9 F (37.2 C)  TempSrc:   Oral Oral  SpO2: 96% 94% 96% 94%  Weight:      Height:        Wt Readings from Last 3 Encounters:  11/19/15 52.6 kg (116 lb)  06/08/14 53.5 kg (118 lb)  02/26/14 55.8 kg (123 lb)     Intake/Output Summary (Last 24 hours) at 11/20/15 1113 Last data filed at 11/20/15 0924  Gross per 24 hour  Intake          1608.42 ml  Output              200 ml  Net          1408.42 ml     Physical Exam  Awake Alert, Oriented X 3, No new F.N deficits, Normal affect [place].AT,PERRAL Supple Neck,No JVD, No cervical lymphadenopathy appriciated.  Symmetrical Chest wall movement, Good air movement bilaterally, CTAB RRR,No Gallops,Rubs or new Murmurs, No Parasternal Heave +ve B.Sounds, Abd Soft, Mild left lower quadrant tenderness, No rebound - guarding or rigidity. No Cyanosis, Clubbing or edema, No new Rash or bruise     Data Review:    CBC  Recent Labs Lab 11/19/15 2201 11/19/15 2208  WBC 18.6*  --   HGB 12.8 13.6  HCT 38.6 40.0  PLT 320  --   MCV 86.7  --   MCH 28.8  --   MCHC 33.2  --  RDW 14.3  --   LYMPHSABS 1.4  --   MONOABS 1.7*  --   EOSABS 0.1  --   BASOSABS 0.0  --     Chemistries   Recent Labs Lab 11/19/15 2200 11/19/15 2201 11/19/15 2208 11/20/15 0413  NA  --  135 136 134*  K  --  3.1* 3.1* 3.4*  CL  --  100* 98* 101  CO2  --  24  --  21*  GLUCOSE  --  118* 116* 105*  BUN  --  26* 25* 21*  CREATININE  --  1.17* 1.30* 1.12*  CALCIUM  --  9.7  --  8.7*  MG 2.0  --   --   --   AST  --  22  --   --   ALT  --  15  --   --   ALKPHOS  --  68  --   --   BILITOT  --  0.7  --   --    ------------------------------------------------------------------------------------------------------------------ No results for input(s): CHOL, HDL, LDLCALC, TRIG, CHOLHDL, LDLDIRECT in the last 72 hours.  No results found for:  HGBA1C ------------------------------------------------------------------------------------------------------------------ No results for input(s): TSH, T4TOTAL, T3FREE, THYROIDAB in the last 72 hours.  Invalid input(s): FREET3 ------------------------------------------------------------------------------------------------------------------ No results for input(s): VITAMINB12, FOLATE, FERRITIN, TIBC, IRON, RETICCTPCT in the last 72 hours.  Coagulation profile No results for input(s): INR, PROTIME in the last 168 hours.  No results for input(s): DDIMER in the last 72 hours.  Cardiac Enzymes No results for input(s): CKMB, TROPONINI, MYOGLOBIN in the last 168 hours.  Invalid input(s): CK ------------------------------------------------------------------------------------------------------------------ No results found for: BNP  Inpatient Medications  Scheduled Meds: . amLODipine  5 mg Oral Daily  . ARIPiprazole  5 mg Oral Daily  . aspirin EC  81 mg Oral Daily  . atorvastatin  20 mg Oral q1800  . ciprofloxacin  400 mg Intravenous QHS  . DULoxetine  90 mg Oral Daily  . heparin  5,000 Units Subcutaneous Q8H  . levothyroxine  25 mcg Oral QAC breakfast  . metronidazole  500 mg Intravenous Q8H   Continuous Infusions: . 0.9 % NaCl with KCl 40 mEq / L     PRN Meds:.acetaminophen **OR** acetaminophen, hydrALAZINE, HYDROcodone-acetaminophen, LORazepam, ondansetron **OR** ondansetron (ZOFRAN) IV  Micro Results No results found for this or any previous visit (from the past 240 hour(s)).  Radiology Reports Ct Abdomen Pelvis W Contrast  Result Date: 11/19/2015 CLINICAL DATA:  Left lower quadrant pain. Loss of appetite. Weakness. Diarrhea. Recent diagnosis of urinary tract infection. White cell count 18.6. EXAM: CT ABDOMEN AND PELVIS WITH CONTRAST TECHNIQUE: Multidetector CT imaging of the abdomen and pelvis was performed using the standard protocol following bolus administration of  intravenous contrast. CONTRAST:  19mL ISOVUE-300 IOPAMIDOL (ISOVUE-300) INJECTION 61%, 52mL ISOVUE-300 IOPAMIDOL (ISOVUE-300) INJECTION 61% COMPARISON:  01/02/2014 FINDINGS: Lower chest: Linear atelectasis or fibrosis in the lung bases. Moderate-sized esophageal hiatal hernia. Thickening of the wall of the distal esophagus may represent reflux disease. Hepatobiliary: Mild diffuse fatty infiltration of the liver. Gallbladder and bile ducts are unremarkable. Pancreas: Unremarkable. No pancreatic ductal dilatation or surrounding inflammatory changes. Spleen: Normal in size without focal abnormality. Adrenals/Urinary Tract: No adrenal gland nodules. Renal nephrograms are symmetrical. No solid mass or hydronephrosis in either kidney. Bladder wall is not thickened. Stomach/Bowel: Stomach, small bowel, and colon are not abnormally distended. Diffuse stool throughout the colon. Diverticulosis of the sigmoid colon. There is inflammatory stranding in the fat around the sigmoid  colon. Changes are consistent with acute diverticulitis. No abscess. There is a fairly abrupt change in caliber between the sigmoid colon and the rectum. Recommend evaluation of the colon after resolution of the acute process to exclude an underlying colon cancer. Vascular/Lymphatic: Aortic atherosclerosis. No enlarged abdominal or pelvic lymph nodes. Reproductive: Status post hysterectomy. No adnexal masses. Other: No abdominal wall hernia or abnormality. No abdominopelvic ascites. Musculoskeletal: Degenerative changes in the lumbar spine. No destructive bone lesions. IMPRESSION: Diverticulosis of the sigmoid colon with inflammatory changes consistent with acute diverticulitis. No abscess. There is an abrupt change in caliber between sigmoid colon and the rectum. Recommend evaluation of the colon after resolution of the acute process to exclude underlying colon cancer. Diffuse fatty infiltration of the liver. Moderate-sized esophageal hiatal hernia  with possible esophageal reflux disease. Electronically Signed   By: Lucienne Capers M.D.   On: 11/19/2015 23:42      Kourtnei Rauber M.D on 11/20/2015 at 11:13 AM  Between 7am to 7pm - Pager - [phone]  After 7pm go to [email or web address] - password [facility]  [facility] -  Office  [phone]

## 2015-11-20 NOTE — H&P (Signed)
History and Physical    Deborah Rivers [id number] DOB: 06-29-1924 DOA: 11/19/2015  PCP: Sherrie Mustache, MD   Patient coming from: Home  Chief Complaint: Generalized weakness, LLQ abdominal pain, watery diarrhea   HPI: Deborah Rivers is a 82 y.o. female with medical history significant for hypertension, hypothyroidism, depression, anxiety, and chronic kidney disease stage III who presents the emergency department with a week of dysuria, generalized weakness, lower abdominal pain, and now 2 days of watery diarrhea. Patient has dementia and her ability to provide a history is limited by this. History is augmented by her son and daughter-in-law at the bedside. She had apparently been in her usual state of health until approximately one week ago when she developed a generalized weakness, loss of appetite, and dysuria. She was evaluated by her primary care physician for this approximately 3 days ago and prescribed Macrobid. She has since developed cramping abdominal pain in the left lower quadrant with watery diarrhea and worsening in her generalized weakness and malaise. She spoke with the pharmacist today who instructed her to discontinue the Macrobid and seek evaluation in the emergency department. Patient denies chest pain, palpitations, dyspnea, or cough. Patient also denies headache, change in vision or hearing, or focal numbness or weakness.   ED Course: Upon arrival to the ED, patient is found to be afebrile, saturating well on room air, and with vital signs otherwise stable. CMP is notable for a potassium of 3.1, BUN 26, and serum creatinine 1.17 which appears to be at her baseline. CBC is notable for a leukocytosis to 18,600 and urinalysis is notable only for trace hemoglobin and 30 protein. DRE produces FOBT negative stool. Patient was given 500 ML normal saline, blood and urine cultures were obtained, C. difficile and GI pathogen panel were sent, and she was treated empirically with  Rocephin, Cipro, and Flagyl by the ED provider. She has remained hemodynamically stable and in no respiratory distress. She'll be observed abdomen-surgical unit for ongoing evaluation and management of weakness, malaise, loss of appetite, and diarrhea suspected secondary to acute diverticulitis.  Review of Systems:  All other systems reviewed and apart from HPI, are negative.  Past Medical History:  Diagnosis Date  . Anemia   . Essential hypertension   . GERD (gastroesophageal reflux disease)   . Hyperlipidemia   . Hypothyroidism     Past Surgical History:  Procedure Laterality Date  . APPENDECTOMY    . HERNIA REPAIR    . TOTAL VAGINAL HYSTERECTOMY       reports that she has never smoked. She has never used smokeless tobacco. She reports that she does not drink alcohol or use drugs.  Allergies  Allergen Reactions  . Sulfa Antibiotics Nausea Only, Anxiety and Rash    Family History  Problem Relation Age of Onset  . Stroke Mother   . Heart attack Father   . Heart attack Maternal Grandmother   . Heart attack Maternal Grandfather   . Heart attack Paternal Grandmother   . Heart attack Paternal Grandfather   . Diabetes    . Heart disease    . Kidney disease    . Heart attack Brother   . Cancer Sister      Prior to Admission medications   Medication Sig Start Date End Date Taking? Authorizing Provider  amLODipine (NORVASC) 5 MG tablet Take 5 mg by mouth daily.   Yes Historical Provider, MD  ARIPiprazole (ABILIFY) 5 MG tablet Take 5 mg by mouth daily.  11/08/15  Yes Historical Provider, MD  aspirin 81 MG tablet Take 81 mg by mouth daily.   Yes Historical Provider, MD  atorvastatin (LIPITOR) 20 MG tablet Take 20 mg by mouth daily at 6 PM.  10/10/15  Yes Historical Provider, MD  DULoxetine (CYMBALTA) 30 MG capsule Take 30 mg by mouth daily. Take along with 60 mg tablet=90 mg   Yes Historical Provider, MD  DULoxetine (CYMBALTA) 60 MG capsule Take 60 mg by mouth daily. Take along  with 30 mg tablet=90 mg   Yes Historical Provider, MD  levothyroxine (SYNTHROID, LEVOTHROID) 25 MCG tablet Take 25 mcg by mouth daily before breakfast.   Yes Historical Provider, MD  nitrofurantoin, macrocrystal-monohydrate, (MACROBID) 100 MG capsule Take 100 mg by mouth 2 (two) times daily. ABT Start Date 11/16/15 & End Date 11/26/15. 11/16/15  Yes Historical Provider, MD  ergocalciferol (VITAMIN D2) 50000 UNITS capsule Take 50,000 Units by mouth once a week. Mon    Historical Provider, MD  LORazepam (ATIVAN) 0.5 MG tablet Take 0.25 mg by mouth at bedtime as needed for anxiety.     Historical Provider, MD  Misc. Devices MISC  06/30/15   Historical Provider, MD  triamcinolone (NASACORT AQ) 55 MCG/ACT AERO nasal inhaler Place 1 spray into the nose daily as needed (allergies).  07/21/15 07/20/16  Historical Provider, MD    Physical Exam: Vitals:   11/19/15 2300 11/19/15 2330 11/20/15 0000 11/20/15 0032  BP: 137/99 159/90 162/93 155/77  Pulse: 117  102 98  Resp:      Temp:      TempSrc:      SpO2: (!) 86%  96% 94%  Weight:      Height:          Constitutional: NAD, calm, in apparent discomfort Eyes: PERTLA, lids and conjunctivae normal ENMT: Mucous membranes are dry. Posterior pharynx clear of any exudate or lesions.   Neck: normal, supple, no masses, no thyromegaly Respiratory: clear to auscultation bilaterally, no wheezing, no crackles. Normal respiratory effort.    Cardiovascular: S1 & S2 heard, regular rate and rhythm. No extremity edema. No significant JVD. Abdomen: No distension, exquisitely tender in LLQ without rebound pain or guarding, no masses palpated. Bowel sounds normal.  Musculoskeletal: no clubbing / cyanosis. No joint deformity upper and lower extremities. Normal muscle tone.  Skin: no significant rashes, lesions, ulcers. Warm, dry, well-perfused. Neurologic: CN 2-12 grossly intact. Sensation intact, DTR normal. Strength 5/5 in all 4 limbs.  Psychiatric: Confused, tangential.  Alert and oriented to person and place only. Normal mood and affect.     Labs on Admission: I have personally reviewed following labs and imaging studies  CBC:  Recent Labs Lab 11/19/15 2201 11/19/15 2208  WBC 18.6*  --   NEUTROABS 15.4*  --   HGB 12.8 13.6  HCT 38.6 40.0  MCV 86.7  --   PLT 320  --    Basic Metabolic Panel:  Recent Labs Lab 11/19/15 2201 11/19/15 2208  NA 135 136  K 3.1* 3.1*  CL 100* 98*  CO2 24  --   GLUCOSE 118* 116*  BUN 26* 25*  CREATININE 1.17* 1.30*  CALCIUM 9.7  --    GFR: Estimated Creatinine Clearance: 24.3 mL/min (by C-G formula based on SCr of 1.3 mg/dL (H)). Liver Function Tests:  Recent Labs Lab 11/19/15 2201  AST 22  ALT 15  ALKPHOS 68  BILITOT 0.7  PROT 7.5  ALBUMIN 4.3    Recent Labs Lab 11/19/15 2201  LIPASE  22   No results for input(s): AMMONIA in the last 168 hours. Coagulation Profile: No results for input(s): INR, PROTIME in the last 168 hours. Cardiac Enzymes: No results for input(s): CKTOTAL, CKMB, CKMBINDEX, TROPONINI in the last 168 hours. BNP (last 3 results) No results for input(s): PROBNP in the last 8760 hours. HbA1C: No results for input(s): HGBA1C in the last 72 hours. CBG: No results for input(s): GLUCAP in the last 168 hours. Lipid Profile: No results for input(s): CHOL, HDL, LDLCALC, TRIG, CHOLHDL, LDLDIRECT in the last 72 hours. Thyroid Function Tests: No results for input(s): TSH, T4TOTAL, FREET4, T3FREE, THYROIDAB in the last 72 hours. Anemia Panel: No results for input(s): VITAMINB12, FOLATE, FERRITIN, TIBC, IRON, RETICCTPCT in the last 72 hours. Urine analysis:    Component Value Date/Time   COLORURINE AMBER (A) 11/19/2015 1948   APPEARANCEUR CLEAR 11/19/2015 1948   LABSPEC 1.026 11/19/2015 1948   PHURINE 6.0 11/19/2015 1948   GLUCOSEU NEGATIVE 11/19/2015 1948   HGBUR TRACE (A) 11/19/2015 1948   BILIRUBINUR SMALL (A) 11/19/2015 1948   KETONESUR NEGATIVE 11/19/2015 1948    PROTEINUR 30 (A) 11/19/2015 1948   UROBILINOGEN 1.0 06/16/2014 1825   NITRITE NEGATIVE 11/19/2015 1948   LEUKOCYTESUR NEGATIVE 11/19/2015 1948   Sepsis Labs: @LABRCNTIP (procalcitonin:4,lacticidven:4) )No results found for this or any previous visit (from the past 240 hour(s)).   Radiological Exams on Admission: Ct Abdomen Pelvis W Contrast  Result Date: 11/19/2015 CLINICAL DATA:  Left lower quadrant pain. Loss of appetite. Weakness. Diarrhea. Recent diagnosis of urinary tract infection. White cell count 18.6. EXAM: CT ABDOMEN AND PELVIS WITH CONTRAST TECHNIQUE: Multidetector CT imaging of the abdomen and pelvis was performed using the standard protocol following bolus administration of intravenous contrast. CONTRAST:  30mL ISOVUE-300 IOPAMIDOL (ISOVUE-300) INJECTION 61%, 40mL ISOVUE-300 IOPAMIDOL (ISOVUE-300) INJECTION 61% COMPARISON:  01/02/2014 FINDINGS: Lower chest: Linear atelectasis or fibrosis in the lung bases. Moderate-sized esophageal hiatal hernia. Thickening of the wall of the distal esophagus may represent reflux disease. Hepatobiliary: Mild diffuse fatty infiltration of the liver. Gallbladder and bile ducts are unremarkable. Pancreas: Unremarkable. No pancreatic ductal dilatation or surrounding inflammatory changes. Spleen: Normal in size without focal abnormality. Adrenals/Urinary Tract: No adrenal gland nodules. Renal nephrograms are symmetrical. No solid mass or hydronephrosis in either kidney. Bladder wall is not thickened. Stomach/Bowel: Stomach, small bowel, and colon are not abnormally distended. Diffuse stool throughout the colon. Diverticulosis of the sigmoid colon. There is inflammatory stranding in the fat around the sigmoid colon. Changes are consistent with acute diverticulitis. No abscess. There is a fairly abrupt change in caliber between the sigmoid colon and the rectum. Recommend evaluation of the colon after resolution of the acute process to exclude an underlying colon  cancer. Vascular/Lymphatic: Aortic atherosclerosis. No enlarged abdominal or pelvic lymph nodes. Reproductive: Status post hysterectomy. No adnexal masses. Other: No abdominal wall hernia or abnormality. No abdominopelvic ascites. Musculoskeletal: Degenerative changes in the lumbar spine. No destructive bone lesions. IMPRESSION: Diverticulosis of the sigmoid colon with inflammatory changes consistent with acute diverticulitis. No abscess. There is an abrupt change in caliber between sigmoid colon and the rectum. Recommend evaluation of the colon after resolution of the acute process to exclude underlying colon cancer. Diffuse fatty infiltration of the liver. Moderate-sized esophageal hiatal hernia with possible esophageal reflux disease. Electronically Signed   By: Lucienne Capers M.D.   On: 11/19/2015 23:42    EKG: Not performed, will obtain as appropriate.   Assessment/Plan  1. Diverticulitis  - Presents with several days  of progress abdominal pain, weakness, loss of appetite, and then diarrhea  - She is tender in LLQ and CT findings are consistent with acute diverticulitis; no abscess or perforation identified   - Treatment will be initiated in the hospital given pt age, magnitude of leukocytosis and pain, and dehydration from diarrhea - She was treated with Cipro, Rocephin, and Flagyl in ED, will continue with Cipro and Flagyl   2. UTI - She was started Macrobid 3 days prior to admission - Urine sent for culture  - Cipro, as above, should suffice while awaiting culture data; no prior positive cultures in EMR   3. Hypokalemia   - Serum potassium 3.1 on admission, likely secondary to GI-losses  - 40 mEq KCl added to her IVF; 20 mEq K-Dur given   - Magnesium level pending; replete prn    4. Hypertension - Mildly elevated on admission; pain is likely contributing  - Continue Norvasc   5. Depression, anxiety  - Appears to be stable  - Continue Cymbalta, Abilify, and prn Ativan    6.  CKD stage III  - SCr 1.17 on admission, appears to be at baseline  - Continue IVF hydration given the diarrhea and apparent dehydration   - Repeat chem panel tomorrow   7. Structural colon abnormality on CT  - Abrupt change in caliber going from sigmoid colon to rectum is noted on CT and concerning for possible mass  - Further evaluation advised once the acute inflammatory process has resolved     DVT prophylaxis: sq heparin Code Status: Full  Family Communication: Son and daughter-in-law updated at bedside Disposition Plan: Observe on med-surg Consults called: None Admission status: Observation    Vianne Bulls, MD [facility] Pager [phone]  If 7PM-7AM, please contact night-coverage [email or web address] Password [email or web address]  11/20/2015, 12:36 AM

## 2015-11-20 NOTE — ED Notes (Signed)
Report on pt given to Floor Unit RN ([id number] [id number]).

## 2015-11-21 DIAGNOSIS — K5732 Diverticulitis of large intestine without perforation or abscess without bleeding: Secondary | ICD-10-CM | POA: Diagnosis not present

## 2015-11-21 LAB — CBC
HCT: 30.3 % — ABNORMAL LOW (ref 36.0–46.0)
Hemoglobin: 10.2 g/dL — ABNORMAL LOW (ref 12.0–15.0)
MCH: 29.2 pg (ref 26.0–34.0)
MCHC: 33.7 g/dL (ref 30.0–36.0)
MCV: 86.8 fL (ref 78.0–100.0)
PLATELETS: 218 10*3/uL (ref 150–400)
RBC: 3.49 MIL/uL — AB (ref 3.87–5.11)
RDW: 14.7 % (ref 11.5–15.5)
WBC: 13.7 10*3/uL — AB (ref 4.0–10.5)

## 2015-11-21 LAB — GASTROINTESTINAL PANEL BY PCR, STOOL (REPLACES STOOL CULTURE)
ASTROVIRUS: NOT DETECTED
Adenovirus F40/41: NOT DETECTED
Campylobacter species: NOT DETECTED
Cryptosporidium: NOT DETECTED
Cyclospora cayetanensis: NOT DETECTED
ENTEROTOXIGENIC E COLI (ETEC): NOT DETECTED
Entamoeba histolytica: NOT DETECTED
Enteroaggregative E coli (EAEC): NOT DETECTED
Enteropathogenic E coli (EPEC): NOT DETECTED
Giardia lamblia: NOT DETECTED
NOROVIRUS GI/GII: NOT DETECTED
PLESIMONAS SHIGELLOIDES: NOT DETECTED
Rotavirus A: NOT DETECTED
SAPOVIRUS (I, II, IV, AND V): NOT DETECTED
SHIGA LIKE TOXIN PRODUCING E COLI (STEC): NOT DETECTED
SHIGELLA/ENTEROINVASIVE E COLI (EIEC): NOT DETECTED
Salmonella species: NOT DETECTED
Vibrio cholerae: NOT DETECTED
Vibrio species: NOT DETECTED
Yersinia enterocolitica: NOT DETECTED

## 2015-11-21 LAB — BASIC METABOLIC PANEL
ANION GAP: 8 (ref 5–15)
BUN: 23 mg/dL — ABNORMAL HIGH (ref 6–20)
CALCIUM: 8.3 mg/dL — AB (ref 8.9–10.3)
CO2: 19 mmol/L — ABNORMAL LOW (ref 22–32)
Chloride: 109 mmol/L (ref 101–111)
Creatinine, Ser: 1.15 mg/dL — ABNORMAL HIGH (ref 0.44–1.00)
GFR, EST AFRICAN AMERICAN: 47 mL/min — AB (ref >60)
GFR, EST NON AFRICAN AMERICAN: 41 mL/min — AB (ref >60)
GLUCOSE: 97 mg/dL (ref 65–99)
Potassium: 3.9 mmol/L (ref 3.5–5.1)
SODIUM: 136 mmol/L (ref 135–145)

## 2015-11-21 LAB — URINE CULTURE

## 2015-11-21 MED ORDER — ACETAMINOPHEN 325 MG PO TABS: 650.0000 mg | ORAL_TABLET | Freq: Four times a day (QID) | ORAL | Status: DC | PRN

## 2015-11-21 MED ORDER — CIPROFLOXACIN HCL 500 MG PO TABS: 500.0000 mg | ORAL_TABLET | Freq: Every day | ORAL | 0 refills | Status: AC

## 2015-11-21 MED ORDER — METRONIDAZOLE 500 MG PO TABS: 500.0000 mg | ORAL_TABLET | Freq: Three times a day (TID) | ORAL | 0 refills | Status: AC

## 2015-11-21 NOTE — Discharge Instructions (Signed)
Follow with Primary MD Sherrie Mustache, MD in 7 days   Get CBC, CMP, checked  by Primary MD next visit.    Activity: As tolerated with Full fall precautions use walker/cane & assistance as needed   Disposition Home    Diet: Heart Healthy , soft diet, , with feeding assistance and aspiration precautions.  For Heart failure patients - Check your Weight same time everyday, if you gain over 2 pounds, or you develop in leg swelling, experience more shortness of breath or chest pain, call your Primary MD immediately. Follow Cardiac Low Salt Diet and 1.5 lit/day fluid restriction.   On your next visit with your primary care physician please Get Medicines reviewed and adjusted.   Please request your Prim.MD to go over all Hospital Tests and Procedure/Radiological results at the follow up, please get all Hospital records sent to your Prim MD by signing hospital release before you go home.   If you experience worsening of your admission symptoms, develop shortness of breath, life threatening emergency, suicidal or homicidal thoughts you must seek medical attention immediately by calling 911 or calling your MD immediately  if symptoms less severe.  You Must read complete instructions/literature along with all the possible adverse reactions/side effects for all the Medicines you take and that have been prescribed to you. Take any new Medicines after you have completely understood and accpet all the possible adverse reactions/side effects.   Do not drive, operating heavy machinery, perform activities at heights, swimming or participation in water activities or provide baby sitting services if your were admitted for syncope or siezures until you have seen by Primary MD or a Neurologist and advised to do so again.  Do not drive when taking Pain medications.    Do not take more than prescribed Pain, Sleep and Anxiety Medications  Special Instructions: If you have smoked or chewed Tobacco  in  the last 2 yrs please stop smoking, stop any regular Alcohol  and or any Recreational drug use.  Wear Seat belts while driving.   Please note  You were cared for by a hospitalist during your hospital stay. If you have any questions about your discharge medications or the care you received while you were in the hospital after you are discharged, you can call the unit and asked to speak with the hospitalist on call if the hospitalist that took care of you is not available. Once you are discharged, your primary care physician will handle any further medical issues. Please note that NO REFILLS for any discharge medications will be authorized once you are discharged, as it is imperative that you return to your primary care physician (or establish a relationship with a primary care physician if you do not have one) for your aftercare needs so that they can reassess your need for medications and monitor your lab values.

## 2015-11-21 NOTE — Progress Notes (Signed)
CM consult for [facility] services. Pt from home alone and is hesitant to accept [facility] services. This CM called and spoke with pt son and daughter in law who would like pt to have [facility] services. Pt states she has a MD appointment tomorrow and will talk over it with her MD. Family informed that referral to [facility] agency can be made from MD office. [facility] provider list left in pt room. RW ordered for home use. No other CM needs communicated. Marney Doctor RN,BSN,NCM [phone]

## 2015-11-21 NOTE — Progress Notes (Signed)
Pt now agrees to [facility] services. [facility] chosen when choice offered. [facility] rep contacted for referral. MD orders received.  Marney Doctor RN,BSN,NCM [phone]

## 2015-11-21 NOTE — Discharge Summary (Signed)
Deborah Rivers, is a 82 y.o. female  DOB 07/03/1924  MRN [id number].  Admission date:  11/19/2015  Admitting Physician  Vianne Bulls, MD  Discharge Date:  11/21/2015   Primary MD  Sherrie Mustache, MD  Recommendations for primary care physician for things to follow:  - Please check CBC, BMP your next visit   Admission Diagnosis  UTI (lower urinary tract infection) [N39.0] Diverticulitis of large intestine without perforation or abscess without bleeding [K57.32]   Discharge Diagnosis  UTI (lower urinary tract infection) [N39.0] Diverticulitis of large intestine without perforation or abscess without bleeding [K57.32]    Principal Problem:   Diverticulitis Active Problems:   Essential hypertension   Depression   Dementia   Hypokalemia   CKD (chronic kidney disease), stage III   UTI (lower urinary tract infection)      Past Medical History:  Diagnosis Date  . Anemia   . Essential hypertension   . GERD (gastroesophageal reflux disease)   . Hyperlipidemia   . Hypothyroidism     Past Surgical History:  Procedure Laterality Date  . APPENDECTOMY    . HERNIA REPAIR    . TOTAL VAGINAL HYSTERECTOMY         History of present illness and  Hospital Course:     Kindly see H&P for history of present illness and admission details, please review complete Labs, Consult reports and Test reports for all details in brief  HPI  from the history and physical done on the day of admission 11/20/2015  HPI: Deborah Rivers is a 82 y.o. female with medical history significant for hypertension, hypothyroidism, depression, anxiety, and chronic kidney disease stage III who presents the emergency department with a week of dysuria, generalized weakness, lower abdominal pain, and now 2 days of watery diarrhea. Patient has dementia and her ability to provide a history is limited by this. History is  augmented by her son and daughter-in-law at the bedside. She had apparently been in her usual state of health until approximately one week ago when she developed a generalized weakness, loss of appetite, and dysuria. She was evaluated by her primary care physician for this approximately 3 days ago and prescribed Macrobid. She has since developed cramping abdominal pain in the left lower quadrant with watery diarrhea and worsening in her generalized weakness and malaise. She spoke with the pharmacist today who instructed her to discontinue the Macrobid and seek evaluation in the emergency department. Patient denies chest pain, palpitations, dyspnea, or cough. Patient also denies headache, change in vision or hearing, or focal numbness or weakness.   ED Course: Upon arrival to the ED, patient is found to be afebrile, saturating well on room air, and with vital signs otherwise stable. CMP is notable for a potassium of 3.1, BUN 26, and serum creatinine 1.17 which appears to be at her baseline. CBC is notable for a leukocytosis to 18,600 and urinalysis is notable only for trace hemoglobin and 30 protein. DRE produces FOBT negative stool. Patient was given 500  ML normal saline, blood and urine cultures were obtained, C. difficile and GI pathogen panel were sent, and she was treated empirically with Rocephin, Cipro, and Flagyl by the ED provider. She has remained hemodynamically stable and in no respiratory distress. She'll be observed abdomen-surgical unit for ongoing evaluation and management of weakness, malaise, loss of appetite, and diarrhea suspected secondary to acute diverticulitis.  Hospital Course   Diverticulitis  - Presents with several days of progress abdominal pain, weakness, loss of appetite, and then diarrhea  - She is tender in LLQ on admission and CT findings are consistent with acute diverticulitis; no abscess or perforation identified  - Patient was treated with IV Cipro and Flagyl during  hospital stay, diet was advanced gradually, she has been tolerating soft diet, she is afebrile, leukocytosis trending down, negative GI panel, patient is eager to go home today, we'll discharge on oral Cipro and Flagyl for next 5 days   UTI - She was started Macrobid 3 days prior to admission - Treated with Cipro, urine culture nonhelpful at its growing multiple species  Hypokalemia  - Repleted  Hypertension - Continue Norvasc    Depression, anxiety  - Appears to be stable  - Continue Cymbalta, Abilify, and prn Ativan   CKD stage III  - SCr 1.17 on admission, appears to be at baseline   Structural colon abnormality on CT - Abrupt change in caliber going from sigmoid colon to rectum is noted on CT and concerning for possible mass  - Further evaluation advised once the acute inflammatory process has resolved , scheduled follow-up with lobar GI on 10/27      Discharge Condition:  stable   Follow UP  Follow-up Information    ZEHR, JESSICA D., PA-C Follow up on 12/16/2015.   Specialty:  Gastroenterology Why:  11:00 am Contact information: [place] [address] [phone]        Sherrie Mustache, MD Follow up in 1 week(s).   Specialty:  Family Medicine Contact information: [place] [address] [phone]             Discharge Instructions  and  Discharge Medications    Discharge Instructions    Discharge instructions    Complete by:  As directed    Follow with Primary MD Sherrie Mustache, MD in 7 days   Get CBC, CMP, checked  by Primary MD next visit.    Activity: As tolerated with Full fall precautions use walker/cane & assistance as needed   Disposition Home    Diet: Heart Healthy , soft diet, , with feeding assistance and aspiration precautions.  For Heart failure patients - Check your Weight same time everyday, if you gain over 2 pounds, or you develop in leg swelling, experience more  shortness of breath or chest pain, call your Primary MD immediately. Follow Cardiac Low Salt Diet and 1.5 lit/day fluid restriction.   On your next visit with your primary care physician please Get Medicines reviewed and adjusted.   Please request your Prim.MD to go over all Hospital Tests and Procedure/Radiological results at the follow up, please get all Hospital records sent to your Prim MD by signing hospital release before you go home.   If you experience worsening of your admission symptoms, develop shortness of breath, life threatening emergency, suicidal or homicidal thoughts you must seek medical attention immediately by calling 911 or calling your MD immediately  if symptoms less severe.  You Must read complete instructions/literature along  with all the possible adverse reactions/side effects for all the Medicines you take and that have been prescribed to you. Take any new Medicines after you have completely understood and accpet all the possible adverse reactions/side effects.   Do not drive, operating heavy machinery, perform activities at heights, swimming or participation in water activities or provide baby sitting services if your were admitted for syncope or siezures until you have seen by Primary MD or a Neurologist and advised to do so again.  Do not drive when taking Pain medications.    Do not take more than prescribed Pain, Sleep and Anxiety Medications  Special Instructions: If you have smoked or chewed Tobacco  in the last 2 yrs please stop smoking, stop any regular Alcohol  and or any Recreational drug use.  Wear Seat belts while driving.   Please note  You were cared for by a hospitalist during your hospital stay. If you have any questions about your discharge medications or the care you received while you were in the hospital after you are discharged, you can call the unit and asked to speak with the hospitalist on call if the hospitalist that took care of you is  not available. Once you are discharged, your primary care physician will handle any further medical issues. Please note that NO REFILLS for any discharge medications will be authorized once you are discharged, as it is imperative that you return to your primary care physician (or establish a relationship with a primary care physician if you do not have one) for your aftercare needs so that they can reassess your need for medications and monitor your lab values.   Increase activity slowly    Complete by:  As directed        Medication List    STOP taking these medications   nitrofurantoin (macrocrystal-monohydrate) 100 MG capsule Commonly known as:  MACROBID     TAKE these medications   acetaminophen 325 MG tablet Commonly known as:  TYLENOL Take 2 tablets (650 mg total) by mouth every 6 (six) hours as needed for mild pain (or Fever >/= 101).   amLODipine 5 MG tablet Commonly known as:  NORVASC Take 5 mg by mouth daily.   ARIPiprazole 5 MG tablet Commonly known as:  ABILIFY Take 5 mg by mouth daily.   aspirin 81 MG tablet Take 81 mg by mouth daily.   atorvastatin 20 MG tablet Commonly known as:  LIPITOR Take 20 mg by mouth daily at 6 PM.   ciprofloxacin 500 MG tablet Commonly known as:  CIPRO Take 1 tablet (500 mg total) by mouth daily with breakfast.   DULoxetine 60 MG capsule Commonly known as:  CYMBALTA Take 60 mg by mouth daily. Take along with 30 mg tablet=90 mg   DULoxetine 30 MG capsule Commonly known as:  CYMBALTA Take 30 mg by mouth daily. Take along with 60 mg tablet=90 mg   ergocalciferol 50000 units capsule Commonly known as:  VITAMIN D2 Take 50,000 Units by mouth once a week. Mon   levothyroxine 25 MCG tablet Commonly known as:  SYNTHROID, LEVOTHROID Take 25 mcg by mouth daily before breakfast.   LORazepam 0.5 MG tablet Commonly known as:  ATIVAN Take 0.25 mg by mouth at bedtime as needed for anxiety.   metroNIDAZOLE 500 MG tablet Commonly known  as:  FLAGYL Take 1 tablet (500 mg total) by mouth 3 (three) times daily.   Misc. Devices Misc   NASACORT AQ 55 MCG/ACT Aero nasal inhaler  Generic drug:  triamcinolone Place 1 spray into the nose daily as needed (allergies).         Diet and Activity recommendation: See Discharge Instructions above   Consults obtained -  None   Major procedures and Radiology Reports - PLEASE review detailed and final reports for all details, in brief -      Ct Abdomen Pelvis W Contrast  Result Date: 11/19/2015 CLINICAL DATA:  Left lower quadrant pain. Loss of appetite. Weakness. Diarrhea. Recent diagnosis of urinary tract infection. White cell count 18.6. EXAM: CT ABDOMEN AND PELVIS WITH CONTRAST TECHNIQUE: Multidetector CT imaging of the abdomen and pelvis was performed using the standard protocol following bolus administration of intravenous contrast. CONTRAST:  1mL ISOVUE-300 IOPAMIDOL (ISOVUE-300) INJECTION 61%, 49mL ISOVUE-300 IOPAMIDOL (ISOVUE-300) INJECTION 61% COMPARISON:  01/02/2014 FINDINGS: Lower chest: Linear atelectasis or fibrosis in the lung bases. Moderate-sized esophageal hiatal hernia. Thickening of the wall of the distal esophagus may represent reflux disease. Hepatobiliary: Mild diffuse fatty infiltration of the liver. Gallbladder and bile ducts are unremarkable. Pancreas: Unremarkable. No pancreatic ductal dilatation or surrounding inflammatory changes. Spleen: Normal in size without focal abnormality. Adrenals/Urinary Tract: No adrenal gland nodules. Renal nephrograms are symmetrical. No solid mass or hydronephrosis in either kidney. Bladder wall is not thickened. Stomach/Bowel: Stomach, small bowel, and colon are not abnormally distended. Diffuse stool throughout the colon. Diverticulosis of the sigmoid colon. There is inflammatory stranding in the fat around the sigmoid colon. Changes are consistent with acute diverticulitis. No abscess. There is a fairly abrupt change in caliber  between the sigmoid colon and the rectum. Recommend evaluation of the colon after resolution of the acute process to exclude an underlying colon cancer. Vascular/Lymphatic: Aortic atherosclerosis. No enlarged abdominal or pelvic lymph nodes. Reproductive: Status post hysterectomy. No adnexal masses. Other: No abdominal wall hernia or abnormality. No abdominopelvic ascites. Musculoskeletal: Degenerative changes in the lumbar spine. No destructive bone lesions. IMPRESSION: Diverticulosis of the sigmoid colon with inflammatory changes consistent with acute diverticulitis. No abscess. There is an abrupt change in caliber between sigmoid colon and the rectum. Recommend evaluation of the colon after resolution of the acute process to exclude underlying colon cancer. Diffuse fatty infiltration of the liver. Moderate-sized esophageal hiatal hernia with possible esophageal reflux disease. Electronically Signed   By: Lucienne Capers M.D.   On: 11/19/2015 23:42    Micro Results    Recent Results (from the past 240 hour(s))  Urine culture     Status: Abnormal   Collection Time: 11/19/15  7:52 PM  Result Value Ref Range Status   Specimen Description URINE, CLEAN CATCH  Final   Special Requests NONE  Final   Culture MULTIPLE SPECIES PRESENT, SUGGEST RECOLLECTION (A)  Final   Report Status 11/21/2015 FINAL  Final  Gastrointestinal Panel by PCR , Stool     Status: None   Collection Time: 11/20/15  1:54 PM  Result Value Ref Range Status   Campylobacter species NOT DETECTED NOT DETECTED Final   Plesimonas shigelloides NOT DETECTED NOT DETECTED Final   Salmonella species NOT DETECTED NOT DETECTED Final   Yersinia enterocolitica NOT DETECTED NOT DETECTED Final   Vibrio species NOT DETECTED NOT DETECTED Final   Vibrio cholerae NOT DETECTED NOT DETECTED Final   Enteroaggregative E coli (EAEC) NOT DETECTED NOT DETECTED Final   Enteropathogenic E coli (EPEC) NOT DETECTED NOT DETECTED Final   Enterotoxigenic E coli  (ETEC) NOT DETECTED NOT DETECTED Final   Shiga like toxin producing E coli (STEC) NOT  DETECTED NOT DETECTED Final   Shigella/Enteroinvasive E coli (EIEC) NOT DETECTED NOT DETECTED Final   Cryptosporidium NOT DETECTED NOT DETECTED Final   Cyclospora cayetanensis NOT DETECTED NOT DETECTED Final   Entamoeba histolytica NOT DETECTED NOT DETECTED Final   Giardia lamblia NOT DETECTED NOT DETECTED Final   Adenovirus F40/41 NOT DETECTED NOT DETECTED Final   Astrovirus NOT DETECTED NOT DETECTED Final   Norovirus GI/GII NOT DETECTED NOT DETECTED Final   Rotavirus A NOT DETECTED NOT DETECTED Final   Sapovirus (I, II, IV, and V) NOT DETECTED NOT DETECTED Final       Today   Subjective:   Deborah Rivers today has no headache,no chest pain,She reports abdominal pain significantly subsided , tolerating oral intake, no nausea or vomiting .  Objective:   Blood pressure 115/62, pulse 95, temperature 97.4 F (36.3 C), temperature source Oral, resp. rate 17, height 5\' 3"  (1.6 m), weight 52.6 kg (116 lb), SpO2 94 %.   Intake/Output Summary (Last 24 hours) at 11/21/15 1509 Last data filed at 11/21/15 1230  Gross per 24 hour  Intake             2975 ml  Output              550 ml  Net             2425 ml    Exam Awake Alert,Pleasant Supple Neck,No JVD,  Symmetrical Chest wall movement, Good air movement bilaterally, CTAB RRR,No Gallops,Rubs or new Murmurs, No Parasternal Heave +ve B.Sounds, Abd Soft, today Non tender, No rebound -guarding or rigidity. No Cyanosis, Clubbing or edema, No new Rash or bruise  Data Review   CBC w Diff: Lab Results  Component Value Date   WBC 13.7 (H) 11/21/2015   HGB 10.2 (L) 11/21/2015   HCT 30.3 (L) 11/21/2015   PLT 218 11/21/2015   LYMPHOPCT 8 11/19/2015   MONOPCT 9 11/19/2015   EOSPCT 0 11/19/2015   BASOPCT 0 11/19/2015    CMP: Lab Results  Component Value Date   NA 136 11/21/2015   K 3.9 11/21/2015   CL 109 11/21/2015   CO2 19 (L) 11/21/2015     BUN 23 (H) 11/21/2015   CREATININE 1.15 (H) 11/21/2015   PROT 7.5 11/19/2015   ALBUMIN 4.3 11/19/2015   BILITOT 0.7 11/19/2015   ALKPHOS 68 11/19/2015   AST 22 11/19/2015   ALT 15 11/19/2015  .   Total Time in preparing paper work, data evaluation and todays exam - 35 minutes  Rikki Trosper M.D on 11/21/2015 at 3:09 PM  [facility]   Office  [phone]

## 2015-11-21 NOTE — Care Management Obs Status (Signed)
[facility] NOTIFICATION   Patient Details  Name: Deborah Rivers MRN: [id number] Date of Birth: [birth year]   Medicare Observation Status Notification Given:  Yes    Lynnell Catalan, RN 11/21/2015, 2:56 PM

## 2015-11-22 ENCOUNTER — Ambulatory Visit: Payer: Medicare Other | Admitting: Cardiovascular Disease

## 2015-11-25 LAB — CULTURE, BLOOD (ROUTINE X 2)
CULTURE: NO GROWTH
CULTURE: NO GROWTH

## 2015-12-12 ENCOUNTER — Ambulatory Visit: Payer: Self-pay | Admitting: Nurse Practitioner

## 2015-12-13 ENCOUNTER — Ambulatory Visit: Payer: Medicare Other | Admitting: Cardiovascular Disease

## 2015-12-16 ENCOUNTER — Ambulatory Visit (INDEPENDENT_AMBULATORY_CARE_PROVIDER_SITE_OTHER): Payer: Medicare Other | Admitting: Gastroenterology

## 2015-12-16 ENCOUNTER — Encounter: Payer: Self-pay | Admitting: Gastroenterology

## 2015-12-16 VITALS — BP 124/66 | HR 88 | Ht 63.0 in | Wt 116.1 lb

## 2015-12-16 DIAGNOSIS — R938 Abnormal findings on diagnostic imaging of other specified body structures: Secondary | ICD-10-CM | POA: Diagnosis not present

## 2015-12-16 DIAGNOSIS — D649 Anemia, unspecified: Secondary | ICD-10-CM | POA: Diagnosis not present

## 2015-12-16 DIAGNOSIS — R933 Abnormal findings on diagnostic imaging of other parts of digestive tract: Secondary | ICD-10-CM | POA: Insufficient documentation

## 2015-12-16 DIAGNOSIS — R9389 Abnormal findings on diagnostic imaging of other specified body structures: Secondary | ICD-10-CM

## 2015-12-16 MED ORDER — NA SULFATE-K SULFATE-MG SULF 17.5-3.13-1.6 GM/177ML PO SOLN: 1.0000 | Freq: Once | ORAL | 0 refills | Status: AC

## 2015-12-16 NOTE — Patient Instructions (Addendum)
You have been scheduled for a colonoscopy. Please follow written instructions given to you at your visit today.  Please pick up your prep supplies at the pharmacy within the next 1-3 days. [facility],  If you use inhalers (even only as needed), please bring them with you on the day of your procedure. Your physician has requested that you go to www.startemmi.com and enter the access code given to you at your visit today. This web site gives a general overview about your procedure. However, you should still follow specific instructions given to you by our office regarding your preparation for the procedure.

## 2015-12-16 NOTE — Progress Notes (Signed)
Reviewed and agree with management plan.  Kristofer Schaffert T. Kortlynn Poust, MD FACG 

## 2015-12-16 NOTE — Progress Notes (Signed)
12/16/2015 Deborah Rivers [id number] May 22, 1924   HISTORY OF PRESENT ILLNESS:  This is a pleasant 82 year old female who is previously known to Deborah Rivers.  She is here for hospital follow-up of diverticulitis, referred by Deborah Rivers, Deborah Rivers.  This was confirmed by CT scan.  She completed the antibiotics and her symptoms have about completely resolved.  CT scan stated the following:    "IMPRESSION: Diverticulosis of the sigmoid colon with inflammatory changes consistent with acute diverticulitis. No abscess. There is an abrupt change in caliber between sigmoid colon and the rectum. Recommend evaluation of the colon after resolution of the acute process to exclude underlying colon cancer. Diffuse fatty infiltration of the liver. Moderate-sized esophageal hiatal hernia with possible esophageal reflux disease."  She says that she really does feel much better.  She is here with her son and daughter-in-law.  They all report that she has had very little to no appetite recently, however.  Her last colonoscopy was in 04/2001 by Deborah Rivers in [place] at [facility] at which time she was found to have hemorrhoids and diverticular disease; no polyps.    Has also been noted to have a mild anemia recently.  Most recent Hgb 10.1 grams, which has been down recently as compared to a few months ago when she was running in the 11-12 gram range.  She denies seeing blood in her stool.  Was hemoccult negative at the hospital but her family reports that she has had at least one positive hemoccult by PCP recently.  Past Medical History:  Diagnosis Date  . Anemia   . Diverticulitis   . Essential hypertension   . GERD (gastroesophageal reflux disease)   . Hyperlipidemia   . Hypothyroidism   . Recurrent UTI    Past Surgical History:  Procedure Laterality Date  . APPENDECTOMY    . HERNIA REPAIR    . TOTAL VAGINAL HYSTERECTOMY      reports that she has never smoked. She has never used smokeless  tobacco. She reports that she does not drink alcohol or use drugs. family history includes Cancer in her sister; Heart attack in her brother, father, maternal grandfather, maternal grandmother, paternal grandfather, and paternal grandmother; Stroke in her mother. Allergies  Allergen Reactions  . Sulfa Antibiotics Nausea Only, Anxiety and Rash      Outpatient Encounter Prescriptions as of 12/16/2015  Medication Sig  . acetaminophen (TYLENOL) 325 MG tablet Take 2 tablets (650 mg total) by mouth every 6 (six) hours as needed for mild pain (or Fever >/= 101).  Marland Kitchen amLODipine (NORVASC) 5 MG tablet Take 5 mg by mouth daily.  Marland Kitchen aspirin 81 MG tablet Take 81 mg by mouth daily.  Marland Kitchen atorvastatin (LIPITOR) 20 MG tablet Take 20 mg by mouth daily at 6 PM.   . DULoxetine (CYMBALTA) 30 MG capsule Take 30 mg by mouth daily. Take along with 60 mg tablet=90 mg  . DULoxetine (CYMBALTA) 60 MG capsule Take 60 mg by mouth daily. Take along with 30 mg tablet=90 mg  . ergocalciferol (VITAMIN D2) 50000 UNITS capsule Take 50,000 Units by mouth once a week. Mon  . levothyroxine (SYNTHROID, LEVOTHROID) 25 MCG tablet Take 25 mcg by mouth daily before breakfast.  . LORazepam (ATIVAN) 0.5 MG tablet Take 0.25 mg by mouth at bedtime as needed for anxiety.   . Misc. Devices MISC   . triamcinolone (NASACORT AQ) 55 MCG/ACT AERO nasal inhaler Place 1 spray into the nose daily as needed (  allergies).   . [DISCONTINUED] ARIPiprazole (ABILIFY) 5 MG tablet Take 5 mg by mouth daily.    No facility-administered encounter medications on file as of 12/16/2015.      REVIEW OF SYSTEMS  : All other systems reviewed and negative except where noted in the History of Present Illness.   PHYSICAL EXAM: BP 124/66 (BP Location: Left Arm, Patient Position: Sitting, Cuff Size: Normal)   Pulse 88   Ht 5\' 3"  (1.6 m)   Wt 116 lb 2 oz (52.7 kg)   BMI 20.57 kg/m  General: Well developed white female in no acute distress Head: Normocephalic and  atraumatic Eyes:  Sclerae anicteric, conjunctiva pink. Ears: Normal auditory acuity Lungs: Clear throughout to auscultation Heart: Regular rate and rhythm Abdomen: Soft, non-distended.  Normal bowel sounds.  Non-tender. Rectal:  Will be done at the time of colonoscopy. Musculoskeletal: Symmetrical with no gross deformities  Skin: No lesions on visible extremities Extremities: No edema  Neurological: Alert oriented x 4, grossly non-focal Psychological:  Alert and cooperative. Normal mood and affect  ASSESSMENT AND Rivers: -82 year old female who was recently treated for diverticulitis as seen on CT scan.  CT scan also showing an abrupt change in caliber between sigmoid colon and the rectum.  Discussed with Deborah Rivers.  Will schedule colonoscopy a few weeks from now with Deborah Rivers for further evaluation.  The risks, benefits, and alternatives to colonoscopy were discussed with the patient and she consents to proceed.  -Normocytic anemia:  Most recent Hgb 10.1 grams.  Previously in 11-12 gram range.  CC:  Deborah Housekeeper, MD

## 2015-12-28 ENCOUNTER — Telehealth: Payer: Self-pay | Admitting: Gastroenterology

## 2015-12-28 NOTE — Telephone Encounter (Signed)
Pt wanted to reschedule colon for an earlier time due to weakness and loss of appetite.  I gave her 01/05/16 and 01/11/16 to choose from.  She will call her ride and call back to set up.  I put a hold on both days the day she does not choose will need to be released.

## 2015-12-28 NOTE — Telephone Encounter (Signed)
Patient notified appt date and time

## 2015-12-29 ENCOUNTER — Encounter: Payer: Self-pay | Admitting: Gastroenterology

## 2016-01-05 ENCOUNTER — Encounter: Payer: Medicare Other | Admitting: Gastroenterology

## 2016-01-11 ENCOUNTER — Emergency Department (HOSPITAL_COMMUNITY): Payer: Medicare Other

## 2016-01-11 ENCOUNTER — Emergency Department (HOSPITAL_COMMUNITY)
Admission: EM | Admit: 2016-01-11 | Discharge: 2016-01-11 | Disposition: A | Discharge status: Home / Self Care | Payer: Medicare Other | Attending: Emergency Medicine | Admitting: Emergency Medicine

## 2016-01-11 ENCOUNTER — Encounter (HOSPITAL_COMMUNITY): Payer: Self-pay | Admitting: Emergency Medicine

## 2016-01-11 DIAGNOSIS — S199XXA Unspecified injury of neck, initial encounter: Secondary | ICD-10-CM | POA: Diagnosis present

## 2016-01-11 DIAGNOSIS — E039 Hypothyroidism, unspecified: Secondary | ICD-10-CM | POA: Insufficient documentation

## 2016-01-11 DIAGNOSIS — Y999 Unspecified external cause status: Secondary | ICD-10-CM | POA: Insufficient documentation

## 2016-01-11 DIAGNOSIS — W1839XA Other fall on same level, initial encounter: Secondary | ICD-10-CM | POA: Insufficient documentation

## 2016-01-11 DIAGNOSIS — Y92480 Sidewalk as the place of occurrence of the external cause: Secondary | ICD-10-CM | POA: Insufficient documentation

## 2016-01-11 DIAGNOSIS — M549 Dorsalgia, unspecified: Secondary | ICD-10-CM | POA: Insufficient documentation

## 2016-01-11 DIAGNOSIS — M542 Cervicalgia: Secondary | ICD-10-CM | POA: Insufficient documentation

## 2016-01-11 DIAGNOSIS — Z7982 Long term (current) use of aspirin: Secondary | ICD-10-CM | POA: Diagnosis not present

## 2016-01-11 DIAGNOSIS — R55 Syncope and collapse: Secondary | ICD-10-CM | POA: Insufficient documentation

## 2016-01-11 DIAGNOSIS — I10 Essential (primary) hypertension: Secondary | ICD-10-CM | POA: Diagnosis not present

## 2016-01-11 DIAGNOSIS — Z79899 Other long term (current) drug therapy: Secondary | ICD-10-CM | POA: Diagnosis not present

## 2016-01-11 DIAGNOSIS — Y939 Activity, unspecified: Secondary | ICD-10-CM | POA: Diagnosis not present

## 2016-01-11 MED ORDER — MELOXICAM 7.5 MG PO TABS: 7.5000 mg | ORAL_TABLET | Freq: Every day | ORAL | 0 refills | Status: DC | PRN

## 2016-01-11 MED ORDER — DIAZEPAM 2 MG PO TABS
2.0000 mg | ORAL_TABLET | Freq: Once | ORAL | Status: DC
  Filled 2016-01-11: qty 1

## 2016-01-11 MED ORDER — OXYCODONE-ACETAMINOPHEN 5-325 MG PO TABS
1.0000 | ORAL_TABLET | Freq: Once | ORAL | Status: DC
  Filled 2016-01-11: qty 1

## 2016-01-11 MED ORDER — IBUPROFEN 400 MG PO TABS
600.0000 mg | ORAL_TABLET | Freq: Once | ORAL | Status: AC
  Administered 2016-01-11: 600 mg via ORAL
  Filled 2016-01-11: qty 2

## 2016-01-11 NOTE — ED Triage Notes (Signed)
Pt reports falling 3-4 weeks ago. Pt states she had been in the hospital and went home. Pt got out of the car and stood by the sidewalk. Per pt she lost consciousness and fell. Pt c/o upper back pain and lower neck pain. Pt has not had xrays since the fall.

## 2016-01-11 NOTE — ED Provider Notes (Signed)
Deborah Rivers   CSN: [id number] Arrival date & time: 01/11/16  1316   By signing my name below, I, Deborah Rivers, attest that this documentation has been prepared under the direction and in the presence of Deborah Manifold, MD  Electronically Signed: [name], ED Scribe. 01/11/16. 2:03 PM.   History   Chief Complaint Chief Complaint  Patient presents with  . Fall    The history is provided by the patient. No language interpreter was used.   HPI Comments:  Deborah Rivers is a 82 y.o. female, with a hx of back pain, who presents to the Emergency Department complaining of intermittent, moderate neck pain and back pain s/p a fall which occurred several days ago. Pt states she got out of a car, was standing and fell backwards landing on her left side. She states she experienced LOC mid fall. Her pain is worse with movement. She has taken tylenol with no relief. Pt denies numbness, tingling, any other associated symptoms and modifying factors at this time.   Past Medical History:  Diagnosis Date  . Anemia   . Diverticulitis   . Essential hypertension   . GERD (gastroesophageal reflux disease)   . Hyperlipidemia   . Hypothyroidism   . Recurrent UTI     Patient Active Problem List   Diagnosis Date Noted  . Abnormal CT scan 12/16/2015  . Diverticulitis 11/20/2015  . Depression 11/20/2015  . Dementia 11/20/2015  . Hypokalemia 11/20/2015  . CKD (chronic kidney disease), stage III 11/20/2015  . UTI (lower urinary tract infection)   . Aortic calcification ([place]) 02/26/2014  . Essential hypertension 02/26/2014  . Mixed hyperlipidemia 02/26/2014  . Normocytic anemia 07/20/2013  . Infection of urinary tract 06/22/2013  . Back ache 06/22/2013    Past Surgical History:  Procedure Laterality Date  . APPENDECTOMY    . HERNIA REPAIR    . TOTAL VAGINAL HYSTERECTOMY      OB History    No data available       Home Medications    Prior to Admission  medications   Medication Sig Start Date End Date Taking? Authorizing Provider  acetaminophen (TYLENOL) 325 MG tablet Take 2 tablets (650 mg total) by mouth every 6 (six) hours as needed for mild pain (or Fever >/= 101). 11/21/15   Silver Huguenin Elgergawy, MD  amLODipine (NORVASC) 5 MG tablet Take 5 mg by mouth daily.    Historical Provider, MD  aspirin 81 MG tablet Take 81 mg by mouth daily.    Historical Provider, MD  atorvastatin (LIPITOR) 20 MG tablet Take 20 mg by mouth daily at 6 PM.  10/10/15   Historical Provider, MD  DULoxetine (CYMBALTA) 30 MG capsule Take 30 mg by mouth daily. Take along with 60 mg tablet=90 mg    Historical Provider, MD  DULoxetine (CYMBALTA) 60 MG capsule Take 60 mg by mouth daily. Take along with 30 mg tablet=90 mg    Historical Provider, MD  ergocalciferol (VITAMIN D2) 50000 UNITS capsule Take 50,000 Units by mouth once a week. Mon    Historical Provider, MD  levothyroxine (SYNTHROID, LEVOTHROID) 25 MCG tablet Take 25 mcg by mouth daily before breakfast.    Historical Provider, MD  LORazepam (ATIVAN) 0.5 MG tablet Take 0.25 mg by mouth at bedtime as needed for anxiety.     Historical Provider, MD  Misc. Devices MISC  06/30/15   Historical Provider, MD  triamcinolone (NASACORT AQ) 55 MCG/ACT AERO nasal inhaler Place 1 spray  into the nose daily as needed (allergies).  07/21/15 07/20/16  Historical Provider, MD    Family History Family History  Problem Relation Age of Onset  . Stroke Mother   . Heart attack Father   . Heart attack Maternal Grandmother   . Heart attack Maternal Grandfather   . Heart attack Paternal Grandmother   . Heart attack Paternal Grandfather   . Diabetes    . Heart disease    . Kidney disease    . Heart attack Brother   . Cancer Sister     Social History Social History  Substance Use Topics  . Smoking status: Never Smoker  . Smokeless tobacco: Never Used  . Alcohol use No     Allergies   Sulfa antibiotics   Review of Systems Review of  Systems  Musculoskeletal: Positive for back pain and neck pain.  Neurological: Positive for syncope. Negative for numbness.  All other systems reviewed and are negative.  Physical Exam Updated Vital Signs BP 127/94 (BP Location: Left Arm)   Pulse 89   Temp 97.7 F (36.5 C) (Oral)   Resp 18   Ht 5\' 3"  (1.6 m)   Wt 115 lb (52.2 kg)   SpO2 99%   BMI 20.37 kg/m   Physical Exam  Constitutional: She is oriented to person, place, and time. She appears well-developed and well-nourished.  HENT:  Head: Normocephalic and atraumatic.  Eyes: Conjunctivae are normal.  Neck: Neck supple.  Cardiovascular: Normal rate and regular rhythm.   Pulmonary/Chest: Effort normal and breath sounds normal.  Abdominal: Soft. Bowel sounds are normal.  Musculoskeletal: Normal range of motion. She exhibits tenderness.  Mild TTP of left lateral neck and left upper trapezius. No midline spinal tenderness   Neurological: She is alert and oriented to person, place, and time.  Skin: Skin is warm and dry.  Psychiatric: She has a normal mood and affect. Her behavior is normal.  Nursing Rivers and vitals reviewed.  ED Treatments / Results  DIAGNOSTIC STUDIES:  Oxygen Saturation is 99% on RA, normal by my interpretation.    COORDINATION OF CARE:  1:51 PM Discussed treatment plan with pt at bedside and pt agreed to plan.  Labs (all labs ordered are listed, but only abnormal results are displayed) Labs Reviewed - No data to display  EKG  EKG Interpretation None       Radiology No results found.  Procedures Procedures (including critical care time)  Medications Ordered in ED Medications - No data to display   Initial Impression / Assessment and Plan / ED Course  I have reviewed the triage vital signs and the nursing notes.  Pertinent labs & imaging results that were available during my care of the patient were reviewed by me and considered in my medical decision making (see chart for  details).  Clinical Course     82 year old female with neck pain consistent with a muscular etiology. Patient is requesting imaging. It is negative. She has a nonfocal neuro exam. Plans to medical treatment.  Final Clinical Impressions(s) / ED Diagnoses   Final diagnoses:  Neck pain    New Prescriptions New Prescriptions   No medications on file    I personally preformed the services scribed in my presence. The recorded information has been reviewed is accurate. Deborah Manifold, MD.     Deborah Manifold, MD 01/20/16 [phone]

## 2016-01-11 NOTE — ED Notes (Signed)
Pt states she was driving and really wants xrays and not just pain meds. edp aware.

## 2016-01-11 NOTE — ED Notes (Signed)
Pt in xray

## 2016-01-16 ENCOUNTER — Ambulatory Visit: Payer: Medicare Other | Attending: Orthopedic Surgery | Admitting: Physical Therapy

## 2016-01-16 DIAGNOSIS — R2689 Other abnormalities of gait and mobility: Secondary | ICD-10-CM | POA: Insufficient documentation

## 2016-01-16 DIAGNOSIS — R260 Ataxic gait: Secondary | ICD-10-CM | POA: Diagnosis not present

## 2016-01-16 DIAGNOSIS — M6281 Muscle weakness (generalized): Secondary | ICD-10-CM | POA: Diagnosis present

## 2016-01-16 NOTE — Therapy (Signed)
[facility] [place], [place], [address] Phone: [phone]   Fax:  [phone]  Physical Therapy Evaluation  Patient Details  Name: Deborah Rivers MRN: [id number] Date of Birth: July 29, 1924 Referring Provider: Esmond Plants MD.  Encounter Date: 01/16/2016      PT End of Session - 01/16/16 1510    Visit Number 1   Number of Visits 16   Date for PT Re-Evaluation 03/16/16   PT Start Time 0230   PT Stop Time 0305   PT Time Calculation (min) 35 min   Activity Tolerance Patient tolerated treatment well   Behavior During Therapy [facility] for tasks assessed/performed      Past Medical History:  Diagnosis Date  . Anemia   . Diverticulitis   . Essential hypertension   . GERD (gastroesophageal reflux disease)   . Hyperlipidemia   . Hypothyroidism   . Recurrent UTI     Past Surgical History:  Procedure Laterality Date  . APPENDECTOMY    . HERNIA REPAIR    . TOTAL VAGINAL HYSTERECTOMY      There were no vitals filed for this visit.       Subjective Assessment - 01/16/16 1451    Subjective The patient presents to OPPT with c/o recurrent falls.  She states she had a cane but really doesn't use it.  One fall resulted in a fracture to her left patella.  She was strongly encouraged to use a cane and will bring it to her next visit.     Pertinent History Previous left patellar fracture.            [facility] PT Assessment - 01/16/16 0001      Assessment   Medical Diagnosis Lumbar stenosis; balance.   Referring Provider Esmond Plants MD.   Onset Date/Surgical Date --  Ongoing.     Precautions   Precautions Fall     Restrictions   Weight Bearing Restrictions No     Balance Screen   Has the patient fallen in the past 6 months Yes   How many times? --  Several.   Has the patient had a decrease in activity level because of a fear of falling?  Yes   Is the patient reluctant to leave their home because of a fear of  falling?  Yes     [place] residence     Prior Function   Level of Independence Independent     Posture/Postural Control   Posture/Postural Control Postural limitations   Postural Limitations Rounded Shoulders;Forward head;Decreased lumbar lordosis;Increased thoracic kyphosis     ROM / Strength   AROM / PROM / Strength AROM;Strength     AROM   Overall AROM Comments WFL for bilateral LE's.     Strength   Overall Strength Comments Bilateral hip strength= 4-/5; bilateral knees= 4 to 4+/5; normal ankle strength.     Ambulation/Gait   Gait Pattern Decreased step length - right;Decreased step length - left;Decreased hip/knee flexion - right;Decreased hip/knee flexion - left;Decreased dorsiflexion - right;Decreased dorsiflexion - left;Scissoring;Poor foot clearance - left;Poor foot clearance - right   Gait Comments Please see above.  Provided patient with SBA today for safety.     Standardized Balance Assessment   Standardized Balance Assessment Berg Balance Test     Berg Balance Test   Sit to Stand Able to stand  independently using hands   Standing Unsupported Able to stand 2 minutes with supervision  Sitting with Back Unsupported but Feet Supported on Floor or Stool Able to sit safely and securely 2 minutes   Stand to Sit Sits safely with minimal use of hands   Transfers Able to transfer safely, minor use of hands   Standing Unsupported with Eyes Closed Able to stand 10 seconds with supervision   Standing Ubsupported with Feet Together Able to place feet together independently and stand for 1 minute with supervision   From Standing, Reach Forward with Outstretched Arm Can reach forward >12 cm safely (5")   From Standing Position, Pick up Object from Floor Able to pick up shoe, needs supervision   From Standing Position, Turn to Look Behind Over each Shoulder Looks behind from both sides and weight shifts well   Turn 360 Degrees Able to turn 360  degrees safely in 4 seconds or less   Standing Unsupported, Alternately Place Feet on Step/Stool Able to complete 4 steps without aid or supervision   Standing Unsupported, One Foot in Front Able to plae foot ahead of the other independently and hold 30 seconds   Standing on One Leg Tries to lift leg/unable to hold 3 seconds but remains standing independently   Total Score 44                             PT Short Term Goals - 01/16/16 1508      PT SHORT TERM GOAL #1   Title Berg score= 47-48/56.   Time 4   Period Weeks   Status New           PT Long Term Goals - 01/16/16 1508      PT LONG TERM GOAL #1   Title Independent with an HEP.   Time 8   Period Weeks   Status New     PT LONG TERM GOAL #2   Title Berg score= 50/56.   Time 8   Period Weeks   Status New               Plan - 01/16/16 1504    Clinical Impression Statement the patient prsents with balance problems and has fallen several times over the last several months.  Her Berg score was a 44/56 today.  She has been advised by her dosctor that when she first gets up from a seated position to take a moment before walking.  She has poor foot clearance and a tendency to shuffle her gait infrequently.  She has  acne but does not use it.  She said she would bring it to her next visit.   Rehab Potential Good   PT Frequency 2x / week   PT Duration 8 weeks   PT Treatment/Interventions Functional mobility training;Stair training;Gait training;Therapeutic activities;Therapeutic exercise;Balance training;Neuromuscular re-education;Patient/family education   PT Next Visit Plan Balance program.  Bilateral LE strengthening exercises.   Consulted and Agree with Plan of Care Patient      Patient will benefit from skilled therapeutic intervention in order to improve the following deficits and impairments:  Decreased activity tolerance, Decreased strength, Decreased balance  Visit Diagnosis: Ataxic  gait - Plan: PT plan of care cert/re-cert  Other abnormalities of gait and mobility - Plan: PT plan of care cert/re-cert  Muscle weakness (generalized) - Plan: PT plan of care cert/re-cert      G-Codes - [id number] [id number]    Functional Assessment Tool Used Clinical judgement...   Functional Limitation Mobility: Walking and moving  around   Mobility: Walking and Moving Around Current Status [phone]) At least 40 percent but less than 60 percent impaired, limited or restricted   Mobility: Walking and Moving Around Goal Status [phone]) At least 20 percent but less than 40 percent impaired, limited or restricted       Problem List Patient Active Problem List   Diagnosis Date Noted  . Abnormal CT scan 12/16/2015  . Diverticulitis 11/20/2015  . Depression 11/20/2015  . Dementia 11/20/2015  . Hypokalemia 11/20/2015  . CKD (chronic kidney disease), stage III 11/20/2015  . UTI (lower urinary tract infection)   . Aortic calcification ([place]) 02/26/2014  . Essential hypertension 02/26/2014  . Mixed hyperlipidemia 02/26/2014  . Normocytic anemia 07/20/2013  . Infection of urinary tract 06/22/2013  . Back ache 06/22/2013    Richie Bonanno, [place] MPT 01/16/2016, 3:13 PM  [facility] [address] [place], [place], [address] Phone: [phone]   Fax:  [phone]  Name: Deborah Rivers MRN: [id number] Date of Birth: 10/14/1924

## 2016-01-19 ENCOUNTER — Ambulatory Visit: Payer: Medicare Other | Admitting: *Deleted

## 2016-01-20 ENCOUNTER — Ambulatory Visit: Payer: Medicare Other | Admitting: Cardiovascular Disease

## 2016-01-24 ENCOUNTER — Ambulatory Visit: Payer: Medicare Other | Admitting: Physical Therapy

## 2016-01-26 ENCOUNTER — Encounter: Payer: Self-pay | Admitting: Physical Therapy

## 2016-01-26 ENCOUNTER — Ambulatory Visit: Payer: Medicare Other | Attending: Orthopedic Surgery | Admitting: Physical Therapy

## 2016-01-26 DIAGNOSIS — M6281 Muscle weakness (generalized): Secondary | ICD-10-CM | POA: Diagnosis present

## 2016-01-26 DIAGNOSIS — R2689 Other abnormalities of gait and mobility: Secondary | ICD-10-CM | POA: Diagnosis present

## 2016-01-26 DIAGNOSIS — R260 Ataxic gait: Secondary | ICD-10-CM | POA: Diagnosis not present

## 2016-01-26 NOTE — Therapy (Signed)
[facility] [place], [place], [address] Phone: [phone]   Fax:  [phone]  Physical Therapy Treatment  Patient Details  Name: Deborah Rivers MRN: [id number] Date of Birth: [birth year] Referring Provider: Esmond Plants MD.  Encounter Date: 01/26/2016      PT End of Session - 01/26/16 1350    Visit Number 2   Number of Visits 16   Date for PT Re-Evaluation 03/16/16   PT Start Time [id number]   PT Stop Time 1429   PT Time Calculation (min) 41 min   Activity Tolerance Patient tolerated treatment well   Behavior During Therapy [facility] for tasks assessed/performed      Past Medical History:  Diagnosis Date  . Anemia   . Diverticulitis   . Essential hypertension   . GERD (gastroesophageal reflux disease)   . Hyperlipidemia   . Hypothyroidism   . Recurrent UTI     Past Surgical History:  Procedure Laterality Date  . APPENDECTOMY    . HERNIA REPAIR    . TOTAL VAGINAL HYSTERECTOMY      There were no vitals filed for this visit.      Subjective Assessment - 01/26/16 1347    Subjective Reports she may have been doing too many things and is having some back pain between her shoulder blades. States that her LLE is the problem after she broke her patella.   Pertinent History Previous left patellar fracture.   Currently in Pain? Other (Comment)  No rating for mid back or L knee pain today            [facility] PT Assessment - 01/26/16 0001      Assessment   Medical Diagnosis Lumbar stenosis; balance.   Next MD Visit None scheduled     Precautions   Precautions Fall     Restrictions   Weight Bearing Restrictions No                     [facility] Adult PT Treatment/Exercise - 01/26/16 0001      Exercises   Exercises Knee/Hip     Knee/Hip Exercises: Seated   Long Arc Quad Strengthening;Both;2 sets;10 reps;Weights   Long Arc Quad Weight 3 lbs.   Clamshell with Marga Hoots  x20 reps     Knee/Hip  Exercises: Supine   Straight Leg Raises Strengthening;Both;2 sets;10 reps             Balance Exercises - 01/26/16 1414      Balance Exercises: Standing   Standing Eyes Opened Narrow base of support (BOS);Foam/compliant surface  DLS on sideways beam one UE support x3 min   Standing Eyes Closed Narrow base of support (BOS);Solid surface  2 UE support x3 min   Tandem Stance Eyes open;Upper extremity support 1  x3 min   Marching Limitations x3 min marching on sideways beam one UE support   Heel Raises Limitations x15 reps   Toe Raise Limitations x15 reps             PT Short Term Goals - 01/16/16 1508      PT SHORT TERM GOAL #1   Title Berg score= 47-48/56.   Time 4   Period Weeks   Status New           PT Long Term Goals - 01/16/16 1508      PT LONG TERM GOAL #1   Title Independent with an HEP.   Time 8   Period  Weeks   Status New     PT LONG TERM GOAL #2   Title Berg score= 50/56.   Time 8   Period Weeks   Status New               Plan - 01/26/16 1435    Clinical Impression Statement Patient presented in clinic with reports of mid back pain that had started prior to treatment. Patient educated regarding POC with patient approval. Patient able to complete therapeutic exercises and balance activities with education and VCs for proper exercise technique. Patient did well with balance activities with one UE support with basic level and unlevel surfaces.   Rehab Potential Good   PT Frequency 2x / week   PT Duration 8 weeks   PT Treatment/Interventions Functional mobility training;Stair training;Gait training;Therapeutic activities;Therapeutic exercise;Balance training;Neuromuscular re-education;Patient/family education   PT Next Visit Plan Balance program.  Bilateral LE strengthening exercises.   Consulted and Agree with Plan of Care Patient      Patient will benefit from skilled therapeutic intervention in order to improve the following deficits  and impairments:  Decreased activity tolerance, Decreased strength, Decreased balance  Visit Diagnosis: Ataxic gait  Other abnormalities of gait and mobility  Muscle weakness (generalized)     Problem List Patient Active Problem List   Diagnosis Date Noted  . Abnormal CT scan 12/16/2015  . Diverticulitis 11/20/2015  . Depression 11/20/2015  . Dementia 11/20/2015  . Hypokalemia 11/20/2015  . CKD (chronic kidney disease), stage III 11/20/2015  . UTI (lower urinary tract infection)   . Aortic calcification ([place]) 02/26/2014  . Essential hypertension 02/26/2014  . Mixed hyperlipidemia 02/26/2014  . Normocytic anemia 07/20/2013  . Infection of urinary tract 06/22/2013  . Back ache 06/22/2013    Wynelle Fanny, PTA 01/26/2016, 2:41 PM  [facility] [address] [place], [place], [address] Phone: [phone]   Fax:  [phone]  Name: Deborah Rivers MRN: [id number] Date of Birth: 06-07-1924

## 2016-01-31 ENCOUNTER — Ambulatory Visit: Payer: Medicare Other | Admitting: Physical Therapy

## 2016-01-31 DIAGNOSIS — R2689 Other abnormalities of gait and mobility: Secondary | ICD-10-CM

## 2016-01-31 DIAGNOSIS — R260 Ataxic gait: Secondary | ICD-10-CM

## 2016-01-31 DIAGNOSIS — M6281 Muscle weakness (generalized): Secondary | ICD-10-CM

## 2016-01-31 NOTE — Therapy (Signed)
[facility] [place], [place], [address] Phone: [phone]   Fax:  [phone]  Physical Therapy Treatment  Patient Details  Name: Deborah Rivers MRN: [id number] Date of Birth: 21-Jul-1924 Referring Provider: Esmond Plants MD.  Encounter Date: 01/31/2016      PT End of Session - 01/31/16 1434    Visit Number 3   Number of Visits 16   Date for PT Re-Evaluation 03/16/16   PT Start Time [id number]   PT Stop Time 1516   PT Time Calculation (min) 45 min   Activity Tolerance Patient tolerated treatment well   Behavior During Therapy [facility] for tasks assessed/performed      Past Medical History:  Diagnosis Date  . Anemia   . Diverticulitis   . Essential hypertension   . GERD (gastroesophageal reflux disease)   . Hyperlipidemia   . Hypothyroidism   . Recurrent UTI     Past Surgical History:  Procedure Laterality Date  . APPENDECTOMY    . HERNIA REPAIR    . TOTAL VAGINAL HYSTERECTOMY      There were no vitals filed for this visit.      Subjective Assessment - 01/31/16 1449    Subjective Reports going on a long bus ride with church but no current LE pain or knee pain.   Pertinent History Previous left patellar fracture.   Currently in Pain? No/denies            [facility] PT Assessment - 01/31/16 0001      Assessment   Medical Diagnosis Lumbar stenosis; balance.   Next MD Visit None scheduled     Precautions   Precautions Fall     Restrictions   Weight Bearing Restrictions No                     [facility] Adult PT Treatment/Exercise - 01/31/16 0001      Knee/Hip Exercises: Aerobic   Nustep L3 x13 min     Knee/Hip Exercises: Seated   Long Arc Quad Strengthening;Both;2 sets;10 reps;Weights   Long Arc Quad Weight 3 lbs.   Sit to Sand without UE support;1 set;15 reps     Knee/Hip Exercises: Supine   Straight Leg Raises Strengthening;Both;2 sets;10 reps   Other Supine Knee/Hip Exercises B hip  clamshell green theraband x20 reps             Balance Exercises - 01/31/16 1505      Balance Exercises: Standing   Standing Eyes Opened Narrow base of support (BOS);Foam/compliant surface  x3 min   Tandem Stance Eyes open;Upper extremity support 1;Foam/compliant surface  x3 min   Rockerboard Lateral;EO;UE support  x5 min   Heel Raises Limitations x20 reps   Toe Raise Limitations x20 reps             PT Short Term Goals - 01/16/16 1508      PT SHORT TERM GOAL #1   Title Berg score= 47-48/56.   Time 4   Period Weeks   Status New           PT Long Term Goals - 01/16/16 1508      PT LONG TERM GOAL #1   Title Independent with an HEP.   Time 8   Period Weeks   Status New     PT LONG TERM GOAL #2   Title Berg score= 50/56.   Time 8   Period Weeks   Status New  Plan - 01/31/16 1521    Clinical Impression Statement Patient presented in clinic with no reports of any pain or any complaints. Patient able to complete strengthening exercises as directed with moderate multimodal cueing for proper technique. Weakness noted with supine B SLRs today. Patient able to use wrist contact instead of hand held assist on uneven surfaces.   Rehab Potential Good   PT Frequency 2x / week   PT Duration 8 weeks   PT Treatment/Interventions Functional mobility training;Stair training;Gait training;Therapeutic activities;Therapeutic exercise;Balance training;Neuromuscular re-education;Patient/family education   PT Next Visit Plan Balance program.  Bilateral LE strengthening exercises.   Consulted and Agree with Plan of Care Patient      Patient will benefit from skilled therapeutic intervention in order to improve the following deficits and impairments:  Decreased activity tolerance, Decreased strength, Decreased balance  Visit Diagnosis: Ataxic gait  Other abnormalities of gait and mobility  Muscle weakness (generalized)     Problem List Patient  Active Problem List   Diagnosis Date Noted  . Abnormal CT scan 12/16/2015  . Diverticulitis 11/20/2015  . Depression 11/20/2015  . Dementia 11/20/2015  . Hypokalemia 11/20/2015  . CKD (chronic kidney disease), stage III 11/20/2015  . UTI (lower urinary tract infection)   . Aortic calcification ([place]) 02/26/2014  . Essential hypertension 02/26/2014  . Mixed hyperlipidemia 02/26/2014  . Normocytic anemia 07/20/2013  . Infection of urinary tract 06/22/2013  . Back ache 06/22/2013    Wynelle Fanny, PTA 01/31/2016, 3:29 PM  [facility] [address] [place], [place], [address] Phone: [phone]   Fax:  [phone]  Name: Deborah Rivers MRN: [id number] Date of Birth: 12-31-1924

## 2016-02-02 ENCOUNTER — Encounter: Payer: Medicare Other | Admitting: Gastroenterology

## 2016-02-03 ENCOUNTER — Ambulatory Visit: Payer: Medicare Other | Attending: Orthopedic Surgery | Admitting: Physical Therapy

## 2016-02-03 DIAGNOSIS — M6281 Muscle weakness (generalized): Secondary | ICD-10-CM | POA: Insufficient documentation

## 2016-02-03 DIAGNOSIS — R2689 Other abnormalities of gait and mobility: Secondary | ICD-10-CM

## 2016-02-03 DIAGNOSIS — R260 Ataxic gait: Secondary | ICD-10-CM | POA: Diagnosis not present

## 2016-02-03 NOTE — Therapy (Signed)
[facility] [place], [place], [address] Phone: [phone]   Fax:  [phone]  Physical Therapy Treatment  Patient Details  Name: Deborah Rivers MRN: [id number] Date of Birth: 01-Mar-1924 Referring Provider: Esmond Plants MD.  Encounter Date: 02/03/2016      PT End of Session - 02/03/16 1136    Visit Number 4   Number of Visits 16   Date for PT Re-Evaluation 03/16/16   PT Start Time 1100   PT Stop Time 1140   PT Time Calculation (min) 40 min   Activity Tolerance Patient tolerated treatment well   Behavior During Therapy [facility] for tasks assessed/performed      Past Medical History:  Diagnosis Date  . Anemia   . Diverticulitis   . Essential hypertension   . GERD (gastroesophageal reflux disease)   . Hyperlipidemia   . Hypothyroidism   . Recurrent UTI     Past Surgical History:  Procedure Laterality Date  . APPENDECTOMY    . HERNIA REPAIR    . TOTAL VAGINAL HYSTERECTOMY      There were no vitals filed for this visit.      Subjective Assessment - 02/03/16 1105    Subjective no falls recently; doing well today   Currently in Pain? No/denies                         [facility] Adult PT Treatment/Exercise - 02/03/16 1108      Knee/Hip Exercises: Aerobic   Nustep L4 x 15 min     Knee/Hip Exercises: Seated   Long Arc Quad Strengthening;2 sets;10 reps;Weights;Left   Long Arc Quad Weight 3 lbs.   Long [employer] Limitations with 3 sec hold   Hamstring Curl Left;2 sets;10 reps   Hamstring Limitations red theraband     Knee/Hip Exercises: Supine   Short Arc Quad Sets Left;2 sets;10 reps   Short Arc Quad Sets Limitations 2#; 3 sec hold   Bridges Limitations 2x10                  PT Short Term Goals - 01/16/16 1508      PT SHORT TERM GOAL #1   Title Berg score= 47-48/56.   Time 4   Period Weeks   Status New           PT Long Term Goals - 01/16/16 1508      PT LONG TERM  GOAL #1   Title Independent with an HEP.   Time 8   Period Weeks   Status New     PT LONG TERM GOAL #2   Title Berg score= 50/56.   Time 8   Period Weeks   Status New               Plan - 02/03/16 1136    Clinical Impression Statement Session today focued on strengthening LLE per pt request as she feels it continues to be weaker and feel this is contributing to imbalance.  Will continue to benefit from PT to maximize function.   PT Treatment/Interventions Functional mobility training;Stair training;Gait training;Therapeutic activities;Therapeutic exercise;Balance training;Neuromuscular re-education;Patient/family education   PT Next Visit Plan Balance program.  Bilateral LE strengthening exercises.   Consulted and Agree with Plan of Care Patient      Patient will benefit from skilled therapeutic intervention in order to improve the following deficits and impairments:  Decreased activity tolerance, Decreased strength, Decreased balance  Visit Diagnosis:  Ataxic gait  Other abnormalities of gait and mobility  Muscle weakness (generalized)     Problem List Patient Active Problem List   Diagnosis Date Noted  . Abnormal CT scan 12/16/2015  . Diverticulitis 11/20/2015  . Depression 11/20/2015  . Dementia 11/20/2015  . Hypokalemia 11/20/2015  . CKD (chronic kidney disease), stage III 11/20/2015  . UTI (lower urinary tract infection)   . Aortic calcification ([place]) 02/26/2014  . Essential hypertension 02/26/2014  . Mixed hyperlipidemia 02/26/2014  . Normocytic anemia 07/20/2013  . Infection of urinary tract 06/22/2013  . Back ache 06/22/2013      Laureen Abrahams, PT, DPT 02/03/16 11:43 AM    [facility] [address] [place], [place], [address] Phone: [phone]   Fax:  [phone]  Name: Deborah Rivers MRN: [id number] Date of Birth: [birth year]

## 2016-02-07 ENCOUNTER — Ambulatory Visit: Payer: Medicare Other | Admitting: Physical Therapy

## 2016-02-07 ENCOUNTER — Encounter: Payer: Self-pay | Admitting: Physical Therapy

## 2016-02-07 DIAGNOSIS — R260 Ataxic gait: Secondary | ICD-10-CM | POA: Diagnosis not present

## 2016-02-07 DIAGNOSIS — R2689 Other abnormalities of gait and mobility: Secondary | ICD-10-CM

## 2016-02-07 DIAGNOSIS — M6281 Muscle weakness (generalized): Secondary | ICD-10-CM

## 2016-02-07 NOTE — Therapy (Signed)
[facility] [place], [place], [address] Phone: [phone]   Fax:  [phone]  Physical Therapy Treatment  Patient Details  Name: Deborah Rivers MRN: [id number] Date of Birth: Apr 21, 1924 Referring Provider: Esmond Plants MD.  Encounter Date: 02/07/2016      PT End of Session - 02/07/16 1510    Visit Number 5   Number of Visits 16   Date for PT Re-Evaluation 03/16/16   PT Start Time 1525   PT Stop Time 1600  2 units secondary to late arrival to treatment area   PT Time Calculation (min) 35 min   Activity Tolerance Patient tolerated treatment well   Behavior During Therapy [facility] for tasks assessed/performed      Past Medical History:  Diagnosis Date  . Anemia   . Diverticulitis   . Essential hypertension   . GERD (gastroesophageal reflux disease)   . Hyperlipidemia   . Hypothyroidism   . Recurrent UTI     Past Surgical History:  Procedure Laterality Date  . APPENDECTOMY    . HERNIA REPAIR    . TOTAL VAGINAL HYSTERECTOMY      There were no vitals filed for this visit.      Subjective Assessment - 02/07/16 1510    Subjective Reports that her back has been bothering her today after she fell on her back several weeks ago. Reports decorating for the holidays with her sister and her sister's husband.   Pertinent History Previous left patellar fracture.   Currently in Pain? Other (Comment)  Reported R medial scapula pain but unable to assess for PTA            [facility] PT Assessment - 02/07/16 0001      Assessment   Medical Diagnosis Lumbar stenosis; balance.   Next MD Visit None scheduled     Precautions   Precautions Fall     Restrictions   Weight Bearing Restrictions No                     [facility] Adult PT Treatment/Exercise - 02/07/16 0001      Knee/Hip Exercises: Aerobic   Nustep L4 x 15 min     Knee/Hip Exercises: Seated   Long Arc Quad Strengthening;Both;2 sets;10  reps;Weights   Long Arc Quad Weight 4 lbs.   Sit to Sand 15 reps;without UE support  Required VCs for wider BOS and sit closer to edge of table     Knee/Hip Exercises: Supine   Straight Leg Raises Strengthening;Both;2 sets;10 reps   Other Supine Knee/Hip Exercises B hip clamshell green theraband x30 reps                  PT Short Term Goals - 01/16/16 1508      PT SHORT TERM GOAL #1   Title Berg score= 47-48/56.   Time 4   Period Weeks   Status New           PT Long Term Goals - 01/16/16 1508      PT LONG TERM GOAL #1   Title Independent with an HEP.   Time 8   Period Weeks   Status New     PT LONG TERM GOAL #2   Title Berg score= 50/56.   Time 8   Period Weeks   Status New               Plan - 02/07/16 1651    Clinical Impression Statement  Patient arrived to treatment area late and session shortened secondary to that. Patient arrived with reports of R medial scapula discomfort but was unable to assess the pain for PTA. Patient continues to demonstrate BLE weakness but especially LLE weakness today. Patient was instructed in sit to stands without UE support. Patient very restricted with sit to stands with narrow BOS and required multimodal cueing for wider BOS and to seat closer to edge of plinth table at low height. Patient able to complete sit to stands better with wider BOS. CGA to supervision provided by PTA during sit to stands.   Rehab Potential Good   PT Frequency 2x / week   PT Duration 8 weeks   PT Treatment/Interventions Functional mobility training;Stair training;Gait training;Therapeutic activities;Therapeutic exercise;Balance training;Neuromuscular re-education;Patient/family education   PT Next Visit Plan Continue LE strengthening and balance activities per MPT POC.   Consulted and Agree with Plan of Care Patient      Patient will benefit from skilled therapeutic intervention in order to improve the following deficits and impairments:   Decreased activity tolerance, Decreased strength, Decreased balance  Visit Diagnosis: Ataxic gait  Other abnormalities of gait and mobility  Muscle weakness (generalized)     Problem List Patient Active Problem List   Diagnosis Date Noted  . Abnormal CT scan 12/16/2015  . Diverticulitis 11/20/2015  . Depression 11/20/2015  . Dementia 11/20/2015  . Hypokalemia 11/20/2015  . CKD (chronic kidney disease), stage III 11/20/2015  . UTI (lower urinary tract infection)   . Aortic calcification ([place]) 02/26/2014  . Essential hypertension 02/26/2014  . Mixed hyperlipidemia 02/26/2014  . Normocytic anemia 07/20/2013  . Infection of urinary tract 06/22/2013  . Back ache 06/22/2013    Wynelle Fanny, PTA 02/07/2016, 5:03 PM  [facility] [address] [place], [place], [address] Phone: [phone]   Fax:  [phone]  Name: SALY HOSKIN MRN: [id number] Date of Birth: 06-11-1924

## 2016-02-09 ENCOUNTER — Ambulatory Visit: Payer: Medicare Other | Admitting: Physical Therapy

## 2016-02-09 ENCOUNTER — Encounter: Payer: Medicare Other | Admitting: Physical Therapy

## 2016-02-09 ENCOUNTER — Encounter: Payer: Self-pay | Admitting: Physical Therapy

## 2016-02-09 DIAGNOSIS — R260 Ataxic gait: Secondary | ICD-10-CM | POA: Diagnosis not present

## 2016-02-09 DIAGNOSIS — R2689 Other abnormalities of gait and mobility: Secondary | ICD-10-CM

## 2016-02-09 DIAGNOSIS — M6281 Muscle weakness (generalized): Secondary | ICD-10-CM

## 2016-02-09 NOTE — Therapy (Signed)
[facility] [place], [place], [address] Phone: [phone]   Fax:  [phone]  Physical Therapy Treatment  Patient Details  Name: SAFIATOU BARRITT MRN: [id number] Date of Birth: [birth year] Referring Provider: Esmond Plants MD.  Encounter Date: 02/09/2016      PT End of Session - 02/09/16 1700    Visit Number 6   Number of Visits 16   Date for PT Re-Evaluation 03/16/16   PT Start Time 1618   PT Stop Time 1658   PT Time Calculation (min) 40 min   Activity Tolerance Patient tolerated treatment well   Behavior During Therapy [facility] for tasks assessed/performed      Past Medical History:  Diagnosis Date  . Anemia   . Diverticulitis   . Essential hypertension   . GERD (gastroesophageal reflux disease)   . Hyperlipidemia   . Hypothyroidism   . Recurrent UTI     Past Surgical History:  Procedure Laterality Date  . APPENDECTOMY    . HERNIA REPAIR    . TOTAL VAGINAL HYSTERECTOMY      There were no vitals filed for this visit.      Subjective Assessment - 02/09/16 1621    Subjective Reports that she feels alright and reports that if she does any prolonged activity then her LLE will become weak. Reports that her R mid back has bothered her some today but not as bad today.    Pertinent History Previous left patellar fracture.   Currently in Pain? Yes   Pain Score --  No numerical pain rating able to be provided by patient   Pain Location Scapula   Pain Orientation Right;Upper            [facility] PT Assessment - 02/09/16 0001      Assessment   Medical Diagnosis Lumbar stenosis; balance.   Next MD Visit None scheduled     Precautions   Precautions Fall     Restrictions   Weight Bearing Restrictions No                     [facility] Adult PT Treatment/Exercise - 02/09/16 0001      Knee/Hip Exercises: Aerobic   Nustep L4 x 15 min     Knee/Hip Exercises: Seated   Long Arc Quad  Strengthening;Both;2 sets;10 reps;Weights   Long Arc Quad Weight 4 lbs.   Long Arc Quad Limitations ```   Sit to [employer] 10 reps;without UE support  VCs and demo for wider BOS, leaning forward     Knee/Hip Exercises: Supine   Bridges Limitations 2x10   Straight Leg Raises Strengthening;Both;2 sets;10 reps             Balance Exercises - 02/09/16 1702      Balance Exercises: Standing   Tandem Stance Eyes open;Foam/compliant surface;Intermittent upper extremity support  x2 min   SLS Eyes open;Solid surface;Intermittent upper extremity support;2 reps;30 secs  2 reps of 30 sec holds for each LE   Marching Limitations x2 min on floor with intermittant UE use             PT Short Term Goals - 01/16/16 1508      PT SHORT TERM GOAL #1   Title Berg score= 47-48/56.   Time 4   Period Weeks   Status New           PT Long Term Goals - 01/16/16 1508      PT LONG TERM GOAL #  1   Title Independent with an HEP.   Time 8   Period Weeks   Status New     PT LONG TERM GOAL #2   Title Berg score= 50/56.   Time 8   Period Weeks   Status New               Plan - 02/09/16 1704    Clinical Impression Statement Patient continues to present in clinic with LE weakness that is noticable to the patient as well. Patient able to complete all exercises fairly well with multimodal cueing throughout each exercise for proper technique. Patient continues to require greater cueing with sit to stands due to difficulty. With beginning balance activities patient required intemittant BUE support as she was unbalanced. Patient had greatest difficulty when placed into LLE SLS which is when patient reported that LLE did not feel strong and that made exercise difficult. Close CGA provided by PTA during balance activities.    Rehab Potential Good   PT Frequency 2x / week   PT Duration 8 weeks   PT Treatment/Interventions Functional mobility training;Stair training;Gait training;Therapeutic  activities;Therapeutic exercise;Balance training;Neuromuscular re-education;Patient/family education   PT Next Visit Plan Continue LE strengthening and balance activities per MPT POC.   Consulted and Agree with Plan of Care Patient      Patient will benefit from skilled therapeutic intervention in order to improve the following deficits and impairments:  Decreased activity tolerance, Decreased strength, Decreased balance  Visit Diagnosis: Ataxic gait  Other abnormalities of gait and mobility  Muscle weakness (generalized)     Problem List Patient Active Problem List   Diagnosis Date Noted  . Abnormal CT scan 12/16/2015  . Diverticulitis 11/20/2015  . Depression 11/20/2015  . Dementia 11/20/2015  . Hypokalemia 11/20/2015  . CKD (chronic kidney disease), stage III 11/20/2015  . UTI (lower urinary tract infection)   . Aortic calcification ([place]) 02/26/2014  . Essential hypertension 02/26/2014  . Mixed hyperlipidemia 02/26/2014  . Normocytic anemia 07/20/2013  . Infection of urinary tract 06/22/2013  . Back ache 06/22/2013    Wynelle Fanny, PTA 02/09/2016, 5:08 PM  [facility] [address] [place], [place], [address] Phone: [phone]   Fax:  [phone]  Name: MEKEBA ELEDGE MRN: [id number] Date of Birth: 12-29-1924

## 2016-02-16 ENCOUNTER — Ambulatory Visit: Payer: Medicare Other | Admitting: Physical Therapy

## 2016-02-16 ENCOUNTER — Encounter: Payer: Self-pay | Admitting: Physical Therapy

## 2016-02-16 DIAGNOSIS — M6281 Muscle weakness (generalized): Secondary | ICD-10-CM

## 2016-02-16 DIAGNOSIS — R260 Ataxic gait: Secondary | ICD-10-CM | POA: Diagnosis not present

## 2016-02-16 DIAGNOSIS — R2689 Other abnormalities of gait and mobility: Secondary | ICD-10-CM

## 2016-02-16 NOTE — Therapy (Signed)
[facility] [place], [place], [address] Phone: [phone]   Fax:  [phone]  Physical Therapy Treatment  Patient Details  Name: Deborah Rivers MRN: [id number] Date of Birth: December 02, 1924 Referring Provider: Esmond Plants MD.  Encounter Date: 02/16/2016      PT End of Session - 02/16/16 1445    Visit Number 7   Number of Visits 16   Date for PT Re-Evaluation 03/16/16   PT Start Time 6962   PT Stop Time 1447   PT Time Calculation (min) 40 min   Activity Tolerance Patient tolerated treatment well   Behavior During Therapy [facility] for tasks assessed/performed      Past Medical History:  Diagnosis Date  . Anemia   . Diverticulitis   . Essential hypertension   . GERD (gastroesophageal reflux disease)   . Hyperlipidemia   . Hypothyroidism   . Recurrent UTI     Past Surgical History:  Procedure Laterality Date  . APPENDECTOMY    . HERNIA REPAIR    . TOTAL VAGINAL HYSTERECTOMY      There were no vitals filed for this visit.      Subjective Assessment - 02/16/16 1409    Subjective Patient reported doing good after last treatment   Pertinent History Previous left patellar fracture.   Currently in Pain? Yes   Pain Location Scapula   Pain Orientation Right;Upper            [facility] PT Assessment - 02/16/16 0001      Berg Balance Test   Sit to Stand Able to stand  independently using hands   Standing Unsupported Able to stand safely 2 minutes   Sitting with Back Unsupported but Feet Supported on Floor or Stool Able to sit safely and securely 2 minutes   Stand to Sit Sits safely with minimal use of hands   Transfers Able to transfer safely, minor use of hands   Standing Unsupported with Eyes Closed Able to stand 10 seconds safely   Standing Ubsupported with Feet Together Able to place feet together independently and stand 1 minute safely   From Standing, Reach Forward with Outstretched Arm Can reach  confidently >25 cm (10")   From Standing Position, Pick up Object from Floor Able to pick up shoe safely and easily   From Standing Position, Turn to Look Behind Over each Shoulder Looks behind from both sides and weight shifts well   Turn 360 Degrees Able to turn 360 degrees safely in 4 seconds or less   Standing Unsupported, Alternately Place Feet on Step/Stool Able to complete 4 steps without aid or supervision   Standing Unsupported, One Foot in Front Able to plae foot ahead of the other independently and hold 30 seconds   Standing on One Leg Tries to lift leg/unable to hold 3 seconds but remains standing independently   Total Score 49                     [facility] Adult PT Treatment/Exercise - 02/16/16 0001      Knee/Hip Exercises: Aerobic   Nustep L4 x 15 min UE/LE monitored for progression             Balance Exercises - 02/16/16 1441      Balance Exercises: Standing   Standing Eyes Opened Narrow base of support (BOS);Wide (BOA);Solid surface;Foam/compliant surface   Standing Eyes Closed Wide (BOA);Solid surface;4 reps   Tandem Stance Eyes open;Upper extremity support 1  Standing, One Foot on a Step Eyes open;6 inch;5 reps;Other reps (comment)   Step Ups Forward;6 inch;UE support 1   Sit to Stand Time x15             PT Short Term Goals - 02/16/16 1435      PT SHORT TERM GOAL #1   Title Berg score= 47-48/56.   Time 4   Period Weeks   Status Achieved  BERG 49/56 02/16/16           PT Long Term Goals - 02/16/16 1436      PT LONG TERM GOAL #1   Title Independent with an HEP.   Time 8   Period Weeks   Status On-going     PT LONG TERM GOAL #2   Title Berg score= 50/56.   Time 8   Period Weeks   Status On-going               Plan - 02/16/16 1447    Clinical Impression Statement Patient tolerated treatment well today. Patient has reported no falls and has improved BERG score today. Patient progressing overall and feels like she  progressing with all activities. Patient met STG #1 and LTG's ongoing due to balance deficts.   Rehab Potential Good   PT Frequency 2x / week   PT Duration 8 weeks   PT Treatment/Interventions Functional mobility training;Stair training;Gait training;Therapeutic activities;Therapeutic exercise;Balance training;Neuromuscular re-education;Patient/family education   PT Next Visit Plan Continue LE strengthening and balance activities per MPT POC.   Consulted and Agree with Plan of Care Patient      Patient will benefit from skilled therapeutic intervention in order to improve the following deficits and impairments:  Decreased activity tolerance, Decreased strength, Decreased balance  Visit Diagnosis: Ataxic gait  Muscle weakness (generalized)  Other abnormalities of gait and mobility     Problem List Patient Active Problem List   Diagnosis Date Noted  . Abnormal CT scan 12/16/2015  . Diverticulitis 11/20/2015  . Depression 11/20/2015  . Dementia 11/20/2015  . Hypokalemia 11/20/2015  . CKD (chronic kidney disease), stage III 11/20/2015  . UTI (lower urinary tract infection)   . Aortic calcification ([place]) 02/26/2014  . Essential hypertension 02/26/2014  . Mixed hyperlipidemia 02/26/2014  . Normocytic anemia 07/20/2013  . Infection of urinary tract 06/22/2013  . Back ache 06/22/2013   Ladean Raya, PTA 02/16/16 2:54 PM   [facility] [place], [place], [address] Phone: [phone]   Fax:  [phone]  Name: Deborah Rivers MRN: [id number] Date of Birth: March 31, 1924

## 2016-02-22 ENCOUNTER — Ambulatory Visit: Payer: Medicare Other | Admitting: Cardiovascular Disease

## 2016-02-23 ENCOUNTER — Ambulatory Visit: Payer: Medicare Other | Attending: Orthopedic Surgery | Admitting: Physical Therapy

## 2016-02-23 DIAGNOSIS — R2689 Other abnormalities of gait and mobility: Secondary | ICD-10-CM | POA: Insufficient documentation

## 2016-02-23 DIAGNOSIS — M6281 Muscle weakness (generalized): Secondary | ICD-10-CM | POA: Diagnosis present

## 2016-02-23 DIAGNOSIS — R260 Ataxic gait: Secondary | ICD-10-CM | POA: Diagnosis not present

## 2016-02-23 NOTE — Patient Instructions (Addendum)
Strengthening: Straight Leg Raise (Phase 1)    Tighten muscles on front of left thigh, then lift leg and keeping knee locked.  Repeat __10__ times per set. Do __2__ sets per session. Do __2-3__ sessions per day.  http://orth.exer.us/614   Copyright  VHI. All rights reserved.  Strengthening: Hip Abduction (Side-Lying)    Lay on your right side and tighten muscles on front of left thigh, then lift left leg up in the air.  Repeat __10__ times per set. Do __2__ sets per session. Do _2-3___ sessions per day.  http://orth.exer.us/622   Copyright  VHI. All rights reserved.  Bridging    Slowly raise buttocks from floor, keeping stomach tight. Repeat _10___ times per set. Do __2__ sets per session. Do __2-3__ sessions per day.  http://orth.exer.us/1096   Copyright  VHI. All rights reserved.

## 2016-02-23 NOTE — Therapy (Signed)
[facility] [place], [place], [address] Phone: [phone]   Fax:  [phone]  Physical Therapy Treatment  Patient Details  Name: Deborah Rivers MRN: [id number] Date of Birth: 01-Sep-1924 Referring Provider: Esmond Plants MD.  Encounter Date: 02/23/2016      PT End of Session - 02/23/16 1530    Visit Number 8   Number of Visits 16   Date for PT Re-Evaluation 03/16/16   PT Start Time [id number]   PT Stop Time 1516   PT Time Calculation (min) 41 min   Activity Tolerance Patient tolerated treatment well   Behavior During Therapy [facility] for tasks assessed/performed      Past Medical History:  Diagnosis Date  . Anemia   . Diverticulitis   . Essential hypertension   . GERD (gastroesophageal reflux disease)   . Hyperlipidemia   . Hypothyroidism   . Recurrent UTI     Past Surgical History:  Procedure Laterality Date  . APPENDECTOMY    . HERNIA REPAIR    . TOTAL VAGINAL HYSTERECTOMY      There were no vitals filed for this visit.      Subjective Assessment - 02/23/16 1538    Subjective Concerned about losing strength and progress she has made thus far. Reports feeling when walking that left leg doesn't want to cooperate.   Pertinent History Previous left patellar fracture.   Currently in Pain? Other (Comment)  No complaints provided            [facility] PT Assessment - 02/23/16 0001      Assessment   Medical Diagnosis Lumbar stenosis; balance.   Next MD Visit None scheduled     Precautions   Precautions Fall     Restrictions   Weight Bearing Restrictions No                     [facility] Adult PT Treatment/Exercise - 02/23/16 0001      Knee/Hip Exercises: Aerobic   Nustep L5 x 18 min UE/LE monitored for progression     Knee/Hip Exercises: Standing   Hip Flexion AROM;Left;2 sets;10 reps;Knee bent   Hip Abduction AROM;Left;2 sets;10 reps;Knee straight   Forward Step Up Left;3 sets;10 reps;Hand  Hold: 2;Step Height: 6"     Knee/Hip Exercises: Seated   Long Arc Quad Strengthening;Both;2 sets;10 reps;Weights   Long Arc Quad Weight 4 lbs.     Knee/Hip Exercises: Supine   Straight Leg Raises Strengthening;Both;2 sets;10 reps                PT Education - 02/23/16 1530    Education provided Yes   Education Details HEP- SLR, hip abduction, bridge   Person(s) Educated Patient   Methods Explanation;Demonstration;Verbal cues;Handout   Comprehension Verbalized understanding;Returned demonstration;Verbal cues required;Need further instruction          PT Short Term Goals - 02/16/16 1435      PT SHORT TERM GOAL #1   Title Berg score= 47-48/56.   Time 4   Period Weeks   Status Achieved  BERG 49/56 02/16/16           PT Long Term Goals - 02/16/16 1436      PT LONG TERM GOAL #1   Title Independent with an HEP.   Time 8   Period Weeks   Status On-going     PT LONG TERM GOAL #2   Title Berg score= 50/56.   Time 8   Period  Weeks   Status On-going               Plan - 02/23/16 1535    Clinical Impression Statement Patient arrived with no complaints prior to treatment and able to complete exercises although redirected throughout treatment to continue exercises. Patient's LLE strength slowly improving although weakness still notable. Patient progressing towards goals but concerned about losing the strength she has gained. New HEP provided for patient was education regarding technique and parameters and patient verbalized understanding.   Rehab Potential Good   PT Frequency 2x / week   PT Duration 8 weeks   PT Treatment/Interventions Functional mobility training;Stair training;Gait training;Therapeutic activities;Therapeutic exercise;Balance training;Neuromuscular re-education;Patient/family education   PT Next Visit Plan Continue LE strengthening and balance activities per MPT POC.   PT Home Exercise Plan HEP- SLR, hip abduction, bridge   Consulted and  Agree with Plan of Care Patient      Patient will benefit from skilled therapeutic intervention in order to improve the following deficits and impairments:  Decreased activity tolerance, Decreased strength, Decreased balance  Visit Diagnosis: Ataxic gait  Muscle weakness (generalized)  Other abnormalities of gait and mobility     Problem List Patient Active Problem List   Diagnosis Date Noted  . Abnormal CT scan 12/16/2015  . Diverticulitis 11/20/2015  . Depression 11/20/2015  . Dementia 11/20/2015  . Hypokalemia 11/20/2015  . CKD (chronic kidney disease), stage III 11/20/2015  . UTI (lower urinary tract infection)   . Aortic calcification ([place]) 02/26/2014  . Essential hypertension 02/26/2014  . Mixed hyperlipidemia 02/26/2014  . Normocytic anemia 07/20/2013  . Infection of urinary tract 06/22/2013  . Back ache 06/22/2013    Wynelle Fanny, PTA 02/23/2016, 3:39 PM  [facility] [address] [place], [place], [address] Phone: [phone]   Fax:  [phone]  Name: Deborah Rivers MRN: [id number] Date of Birth: 26-Dec-1924

## 2016-02-27 ENCOUNTER — Telehealth: Payer: Self-pay | Admitting: Gastroenterology

## 2016-02-27 NOTE — Telephone Encounter (Signed)
Informed patient that I will mail her new instructions with new date and times on them. Patient verbalized understanding.

## 2016-02-28 ENCOUNTER — Encounter: Payer: Self-pay | Admitting: Physical Therapy

## 2016-02-28 ENCOUNTER — Ambulatory Visit: Payer: Medicare Other | Admitting: Physical Therapy

## 2016-02-28 DIAGNOSIS — R2689 Other abnormalities of gait and mobility: Secondary | ICD-10-CM

## 2016-02-28 DIAGNOSIS — R260 Ataxic gait: Secondary | ICD-10-CM

## 2016-02-28 DIAGNOSIS — M6281 Muscle weakness (generalized): Secondary | ICD-10-CM

## 2016-02-28 NOTE — Therapy (Signed)
[facility] [place], [place], [address] Phone: [phone]   Fax:  [phone]  Physical Therapy Treatment  Patient Details  Name: Deborah Rivers MRN: [id number] Date of Birth: 1924/08/27 Referring Provider: Esmond Plants MD.  Encounter Date: 02/28/2016      PT End of Session - 02/28/16 1415    Visit Number 9   Number of Visits 16   Date for PT Re-Evaluation 03/16/16   PT Start Time 1410   PT Stop Time 1450   PT Time Calculation (min) 40 min   Activity Tolerance Patient tolerated treatment well   Behavior During Therapy [facility] for tasks assessed/performed      Past Medical History:  Diagnosis Date  . Anemia   . Diverticulitis   . Essential hypertension   . GERD (gastroesophageal reflux disease)   . Hyperlipidemia   . Hypothyroidism   . Recurrent UTI     Past Surgical History:  Procedure Laterality Date  . APPENDECTOMY    . HERNIA REPAIR    . TOTAL VAGINAL HYSTERECTOMY      There were no vitals filed for this visit.      Subjective Assessment - 02/28/16 1414    Subjective Reports no problems with exercises as she states she has done them before.   Pertinent History Previous left patellar fracture.   Currently in Pain? No/denies            [facility] PT Assessment - 02/28/16 0001      Assessment   Medical Diagnosis Lumbar stenosis; balance.   Next MD Visit None scheduled     Precautions   Precautions Fall     Restrictions   Weight Bearing Restrictions No                     [facility] Adult PT Treatment/Exercise - 02/28/16 0001      Knee/Hip Exercises: Aerobic   Nustep L5 x 15 min UE/LE monitored for progression     Knee/Hip Exercises: Seated   Long Arc Quad Strengthening;Both;3 sets;10 reps;Weights   Long Arc Quad Weight 4 lbs.   Clamshell with TheraBand Green  3x10 reps             Balance Exercises - 02/28/16 1451      Balance Exercises: Standing   SLS Eyes open;Solid  surface;Upper extremity support 2;3 reps  3x max reps   Step Ups Forward;6 inch;UE support 1  BLE x20 reps   Marching Limitations 3x10 reps on floor             PT Short Term Goals - 02/16/16 1435      PT SHORT TERM GOAL #1   Title Berg score= 47-48/56.   Time 4   Period Weeks   Status Achieved  BERG 49/56 02/16/16           PT Long Term Goals - 02/28/16 1418      PT LONG TERM GOAL #1   Title Independent with an HEP.   Time 8   Period Weeks   Status Achieved     PT LONG TERM GOAL #2   Title Berg score= 50/56.   Time 8   Period Weeks   Status On-going               Plan - 02/28/16 1453    Clinical Impression Statement Patient arrived to treatment with no complaints of pain today. Patient able to complete therapeutic exercises with quad weakness  noted bilaterally with 4# ankleweights. Patient continues to require extensive cueing for exercise technique. Patient did well with SLS on floor activities although she utilized two UE support. No difficulty or compensatory strategies noted with forward step ups and patient using two UE support.   Rehab Potential Good   PT Frequency 2x / week   PT Duration 8 weeks   PT Treatment/Interventions Functional mobility training;Stair training;Gait training;Therapeutic activities;Therapeutic exercise;Balance training;Neuromuscular re-education;Patient/family education   PT Next Visit Plan Continue LE strengthening and balance activities per MPT POC.   PT Home Exercise Plan HEP- SLR, hip abduction, bridge   Consulted and Agree with Plan of Care Patient      Patient will benefit from skilled therapeutic intervention in order to improve the following deficits and impairments:  Decreased activity tolerance, Decreased strength, Decreased balance  Visit Diagnosis: Ataxic gait  Muscle weakness (generalized)  Other abnormalities of gait and mobility     Problem List Patient Active Problem List   Diagnosis Date Noted   . Abnormal CT scan 12/16/2015  . Diverticulitis 11/20/2015  . Depression 11/20/2015  . Dementia 11/20/2015  . Hypokalemia 11/20/2015  . CKD (chronic kidney disease), stage III 11/20/2015  . UTI (lower urinary tract infection)   . Aortic calcification ([place]) 02/26/2014  . Essential hypertension 02/26/2014  . Mixed hyperlipidemia 02/26/2014  . Normocytic anemia 07/20/2013  . Infection of urinary tract 06/22/2013  . Back ache 06/22/2013    Wynelle Fanny, PTA 02/28/2016, 3:03 PM  [facility] [address] [place], [place], [address] Phone: [phone]   Fax:  [phone]  Name: Deborah Rivers MRN: [id number] Date of Birth: Sep 11, 1924

## 2016-02-29 ENCOUNTER — Encounter: Payer: Self-pay | Admitting: Cardiovascular Disease

## 2016-02-29 ENCOUNTER — Ambulatory Visit (INDEPENDENT_AMBULATORY_CARE_PROVIDER_SITE_OTHER): Payer: Medicare Other | Admitting: Cardiovascular Disease

## 2016-02-29 VITALS — BP 128/66 | HR 81 | Ht 63.0 in | Wt 113.0 lb

## 2016-02-29 DIAGNOSIS — E782 Mixed hyperlipidemia: Secondary | ICD-10-CM

## 2016-02-29 DIAGNOSIS — I10 Essential (primary) hypertension: Secondary | ICD-10-CM

## 2016-02-29 NOTE — Assessment & Plan Note (Signed)
History of hyperlipidemia on statin therapy followed by her PCP. 

## 2016-02-29 NOTE — Patient Instructions (Signed)
Medication Instructions: Your physician recommends that you continue on your current medications as directed. Please refer to the Current Medication list given to you today.   Labwork: I will request labs from Dr. Edrick Oh.   Follow-Up: Your physician wants you to follow-up in: 1 year with Dr. Gwenlyn Found. You will receive a reminder letter in the mail two months in advance. If you don't receive a letter, please call our office to schedule the follow-up appointment.  If you need a refill on your cardiac medications before your next appointment, please call your pharmacy.

## 2016-02-29 NOTE — Assessment & Plan Note (Addendum)
History of hypertension blood pressure measured 120/66. She is on amlodipine. Continue current meds at current dosing

## 2016-02-29 NOTE — Progress Notes (Signed)
02/29/2016 Deborah Rivers   05-Apr-1924  [id number]  Primary Physician Sherrie Mustache, MD Primary Cardiologist: Lorretta Harp MD Renae Gloss  HPI:  Deborah Rivers is a delightful 83 year old thin-appearing widowed Caucasian female mother of 2 children, grandmother of 4 grandchildren whose primary care physician is Dr. Edrick Oh. She was referred to be established for ongoing cardiovascular care. I last saw her in the office 06/08/14 Her chronic risk factor profile is notable only for 2 hypertension and hyperlipidemia as well as family history. Her father had myocardial infarction as typical of her brothers. She has never had a heart attack or stroke . She also denies claudication. She walks on the treadmill without limitation. She clearly had an x-ray performed in orthopedic surgeon's office was remarkable for "atherosclerosis". Since I saw her a year and a half ago she has been asymptomatic denying chest pain or shortness of breath.   Current Outpatient Prescriptions  Medication Sig Dispense Refill  . acetaminophen (TYLENOL) 325 MG tablet Take 2 tablets (650 mg total) by mouth every 6 (six) hours as needed for mild pain (or Fever >/= 101).    Marland Kitchen amLODipine (NORVASC) 5 MG tablet Take 5 mg by mouth daily.    Marland Kitchen aspirin 81 MG tablet Take 81 mg by mouth daily.    Marland Kitchen atorvastatin (LIPITOR) 20 MG tablet Take 20 mg by mouth daily at 6 PM.     . DULoxetine (CYMBALTA) 30 MG capsule Take 30 mg by mouth daily. Take along with 60 mg tablet=90 mg    . DULoxetine (CYMBALTA) 60 MG capsule Take 60 mg by mouth daily. Take along with 30 mg tablet=90 mg    . ergocalciferol (VITAMIN D2) 50000 UNITS capsule Take 50,000 Units by mouth once a week. Mon    . LORazepam (ATIVAN) 0.5 MG tablet Take 0.25 mg by mouth at bedtime.     . meloxicam (MOBIC) 7.5 MG tablet Take 1 tablet (7.5 mg total) by mouth daily as needed for pain. 7 tablet 0  . Potassium Chloride ER 20 MEQ TBCR Take 1 tablet by mouth daily.     Marland Kitchen SYNTHROID 50 MCG tablet Take 1 tablet by mouth daily.    Marland Kitchen triamcinolone (NASACORT AQ) 55 MCG/ACT AERO nasal inhaler Place 1 spray into the nose daily as needed (allergies).      No current facility-administered medications for this visit.     Allergies  Allergen Reactions  . Sulfa Antibiotics Nausea Only, Anxiety and Rash    Social History   Social History  . Marital status: Widowed    Spouse name: N/A  . Number of children: 2  . Years of education: N/A   Occupational History  . Retried    Social History Main Topics  . Smoking status: Never Smoker  . Smokeless tobacco: Never Used  . Alcohol use No  . Drug use: No  . Sexual activity: Not on file   Other Topics Concern  . Not on file   Social History Narrative  . No narrative on file     Review of Systems: General: negative for chills, fever, night sweats or weight changes.  Cardiovascular: negative for chest pain, dyspnea on exertion, edema, orthopnea, palpitations, paroxysmal nocturnal dyspnea or shortness of breath Dermatological: negative for rash Respiratory: negative for cough or wheezing Urologic: negative for hematuria Abdominal: negative for nausea, vomiting, diarrhea, bright red blood per rectum, melena, or hematemesis Neurologic: negative for visual changes, syncope, or dizziness All other systems  reviewed and are otherwise negative except as noted above.    Blood pressure 128/66, pulse 81, height 5\' 3"  (1.6 m), weight 113 lb (51.3 kg).  General appearance: alert and no distress Neck: no adenopathy, no carotid bruit, no JVD, supple, symmetrical, trachea midline and thyroid not enlarged, symmetric, no tenderness/mass/nodules Lungs: clear to auscultation bilaterally Heart: regular rate and rhythm, S1, S2 normal, no murmur, click, rub or gallop Extremities: extremities normal, atraumatic, no cyanosis or edema  EKG sinus rhythm at 81 with inferior Q waves and reverse R-wave progression suggesting old  anterolateral infarct. I personally reviewed this EKG  ASSESSMENT AND PLAN:   Essential hypertension History of hypertension blood pressure measured 120/66. She is on amlodipine. Continue current meds at current dosing  Mixed hyperlipidemia History of hyperlipidemia on statin therapy followed by her PCP      Lorretta Harp MD [facility] 02/29/2016 2:23 PM

## 2016-03-01 ENCOUNTER — Encounter: Payer: Self-pay | Admitting: Physical Therapy

## 2016-03-01 ENCOUNTER — Ambulatory Visit: Payer: Medicare Other | Admitting: Physical Therapy

## 2016-03-01 DIAGNOSIS — R260 Ataxic gait: Secondary | ICD-10-CM | POA: Diagnosis not present

## 2016-03-01 DIAGNOSIS — M6281 Muscle weakness (generalized): Secondary | ICD-10-CM

## 2016-03-01 DIAGNOSIS — R2689 Other abnormalities of gait and mobility: Secondary | ICD-10-CM

## 2016-03-01 NOTE — Therapy (Signed)
[facility] [place], [place], [address] Phone: [phone]   Fax:  [phone]  Physical Therapy Treatment  Patient Details  Name: Deborah Rivers MRN: [id number] Date of Birth: 1924-08-17 Referring Provider: Esmond Plants MD.  Encounter Date: 03/01/2016      PT End of Session - 03/01/16 1434    Visit Number 10   Number of Visits 16   Date for PT Re-Evaluation 03/16/16   PT Start Time [id number]   PT Stop Time 1515  2 units secondary to conversation by patient   PT Time Calculation (min) 38 min   Activity Tolerance Patient tolerated treatment well   Behavior During Therapy [facility] for tasks assessed/performed      Past Medical History:  Diagnosis Date  . Anemia   . Diverticulitis   . Essential hypertension   . GERD (gastroesophageal reflux disease)   . Hyperlipidemia   . Hypothyroidism   . Recurrent UTI     Past Surgical History:  Procedure Laterality Date  . APPENDECTOMY    . HERNIA REPAIR    . TOTAL VAGINAL HYSTERECTOMY      There were no vitals filed for this visit.      Subjective Assessment - 03/01/16 1434    Subjective Reports that she went to [facility]  with her sister for cardiologist appointment and they went to several stores and walked.   Pertinent History Previous left patellar fracture.   Currently in Pain? No/denies            [facility] PT Assessment - 03/01/16 0001      Assessment   Medical Diagnosis Lumbar stenosis; balance.   Next MD Visit None scheduled     Precautions   Precautions Fall     Restrictions   Weight Bearing Restrictions No                     [facility] Adult PT Treatment/Exercise - 03/01/16 0001      Knee/Hip Exercises: Aerobic   Nustep L5 x 16 min UE/LE monitored for progression     Knee/Hip Exercises: Seated   Long Arc Quad Strengthening;Both;3 sets;10 reps;Weights   Long Arc Quad Weight 5 lbs.     Knee/Hip Exercises: Supine   Bridges Limitations  2x10   Straight Leg Raises Strengthening;Both;2 sets;10 reps             Balance Exercises - 03/01/16 1508      Balance Exercises: Standing   Standing, One Foot on a Step Eyes open;6 inch;2 reps;Time  x1 min   Marching Limitations x2 min, 1 UE support on airex             PT Short Term Goals - 02/16/16 1435      PT SHORT TERM GOAL #1   Title Berg score= 47-48/56.   Time 4   Period Weeks   Status Achieved  BERG 49/56 02/16/16           PT Long Term Goals - 02/28/16 1418      PT LONG TERM GOAL #1   Title Independent with an HEP.   Time 8   Period Weeks   Status Achieved     PT LONG TERM GOAL #2   Title Berg score= 50/56.   Time 8   Period Weeks   Status On-going               Plan - 03/01/16 1525    Clinical Impression Statement  Patient arrived to treatment with no complaints of pain only of fatigue in LEs after being out in [place]. Patient talkative during treatment which limited exercises. Patient able to complete exercises with max multimodal cueing for proper technique and reinforcements. LE weakness still observable althogh patient reports diligence with HEP. Patient demonstrated greatest instability with SLS actvities and with limited UE support.   Rehab Potential Good   PT Frequency 2x / week   PT Duration 8 weeks   PT Treatment/Interventions Functional mobility training;Stair training;Gait training;Therapeutic activities;Therapeutic exercise;Balance training;Neuromuscular re-education;Patient/family education   PT Next Visit Plan Continue LE strengthening and balance activities per MPT POC.   PT Home Exercise Plan HEP- SLR, hip abduction, bridge   Consulted and Agree with Plan of Care Patient      Patient will benefit from skilled therapeutic intervention in order to improve the following deficits and impairments:  Decreased activity tolerance, Decreased strength, Decreased balance  Visit Diagnosis: Ataxic gait  Muscle weakness  (generalized)  Other abnormalities of gait and mobility     Problem List Patient Active Problem List   Diagnosis Date Noted  . Abnormal CT scan 12/16/2015  . Diverticulitis 11/20/2015  . Depression 11/20/2015  . Dementia 11/20/2015  . Hypokalemia 11/20/2015  . CKD (chronic kidney disease), stage III 11/20/2015  . UTI (lower urinary tract infection)   . Aortic calcification ([place]) 02/26/2014  . Essential hypertension 02/26/2014  . Mixed hyperlipidemia 02/26/2014  . Normocytic anemia 07/20/2013  . Infection of urinary tract 06/22/2013  . Back ache 06/22/2013    Wynelle Fanny, PTA 03/01/2016, 3:39 PM  [facility] [address] [place], [place], [address] Phone: [phone]   Fax:  [phone]  Name: ADELI GONNERING MRN: [id number] Date of Birth: 08/28/1924

## 2016-03-05 ENCOUNTER — Telehealth: Payer: Self-pay | Admitting: Gastroenterology

## 2016-03-05 NOTE — Telephone Encounter (Signed)
Current forecast is for less than 1 inch of snow. No charge this time.

## 2016-03-06 ENCOUNTER — Encounter: Payer: Self-pay | Admitting: Physical Therapy

## 2016-03-06 ENCOUNTER — Ambulatory Visit: Payer: Medicare Other | Admitting: Physical Therapy

## 2016-03-06 ENCOUNTER — Encounter: Payer: Medicare Other | Admitting: Physical Therapy

## 2016-03-06 DIAGNOSIS — R2689 Other abnormalities of gait and mobility: Secondary | ICD-10-CM

## 2016-03-06 DIAGNOSIS — M6281 Muscle weakness (generalized): Secondary | ICD-10-CM

## 2016-03-06 DIAGNOSIS — R260 Ataxic gait: Secondary | ICD-10-CM

## 2016-03-06 NOTE — Therapy (Signed)
[facility] [place], [place], [address] Phone: [phone]   Fax:  [phone]  Physical Therapy Treatment  Patient Details  Name: Deborah Rivers MRN: [id number] Date of Birth: 1924-03-14 Referring Provider: Esmond Plants MD.  Encounter Date: 03/06/2016      PT End of Session - 03/06/16 1446    Visit Number 11   Number of Visits 16   Date for PT Re-Evaluation 03/16/16   PT Start Time [id number]   PT Stop Time 1515  2 units secondary to patient conversation   PT Time Calculation (min) 36 min   Activity Tolerance Patient tolerated treatment well   Behavior During Therapy [facility] for tasks assessed/performed  talkative      Past Medical History:  Diagnosis Date  . Anemia   . Diverticulitis   . Essential hypertension   . GERD (gastroesophageal reflux disease)   . Hyperlipidemia   . Hypothyroidism   . Recurrent UTI     Past Surgical History:  Procedure Laterality Date  . APPENDECTOMY    . HERNIA REPAIR    . TOTAL VAGINAL HYSTERECTOMY      There were no vitals filed for this visit.      Subjective Assessment - 03/06/16 1440    Subjective Reports that she used to get out in her yard but doesn't anymore because of her balance. Reports that she is doing some of her HEP exercises.   Pertinent History Previous left patellar fracture.   Currently in Pain? Other (Comment)  No pain reports provided by patient            [facility] PT Assessment - 03/06/16 0001      Assessment   Medical Diagnosis Lumbar stenosis; balance.   Next MD Visit None scheduled     Precautions   Precautions Fall     Restrictions   Weight Bearing Restrictions No                     [facility] Adult PT Treatment/Exercise - 03/06/16 0001      Standardized Balance Assessment   Standardized Balance Assessment Berg Balance Test     Berg Balance Test   Sit to Stand Able to stand without using hands and stabilize independently   Standing Unsupported Able to stand safely 2 minutes   Sitting with Back Unsupported but Feet Supported on Floor or Stool Able to sit safely and securely 2 minutes   Stand to Sit Sits safely with minimal use of hands   Transfers Able to transfer safely, minor use of hands   Standing Unsupported with Eyes Closed Able to stand 10 seconds safely   Standing Ubsupported with Feet Together Able to place feet together independently and stand 1 minute safely   From Standing, Reach Forward with Outstretched Arm Can reach forward >12 cm safely (5")   From Standing Position, Pick up Object from Floor Able to pick up shoe, needs supervision   From Standing Position, Turn to Look Behind Over each Shoulder Looks behind from both sides and weight shifts well   Turn 360 Degrees Able to turn 360 degrees safely but slowly   Standing Unsupported, Alternately Place Feet on Step/Stool Able to stand independently and complete 8 steps >20 seconds   Standing Unsupported, One Foot in [employer] balance while stepping or standing   Standing on One Leg Able to lift leg independently and hold equal to or more than 3 seconds   Total Score 45  Knee/Hip Exercises: Aerobic   Nustep L5 x 15 min UE/LE monitored for progression                  PT Short Term Goals - 02/16/16 1435      PT SHORT TERM GOAL #1   Title Berg score= 47-48/56.   Time 4   Period Weeks   Status Achieved  BERG 49/56 02/16/16           PT Long Term Goals - 02/28/16 1418      PT LONG TERM GOAL #1   Title Independent with an HEP.   Time 8   Period Weeks   Status Achieved     PT LONG TERM GOAL #2   Title Berg score= 50/56.   Time 8   Period Weeks   Status On-going               Plan - 03/06/16 1522    Clinical Impression Statement Patient's treatment limited today secondary to patient's conversation although patient tolerated treatment well once redirected. Patient had no complaints with NuStep today or with the  balance assessment. Patient's greatest deficits are with narrow base of support such as tandem stance or SLS.   Rehab Potential Good   PT Frequency 2x / week   PT Duration 8 weeks   PT Treatment/Interventions Functional mobility training;Stair training;Gait training;Therapeutic activities;Therapeutic exercise;Balance training;Neuromuscular re-education;Patient/family education   PT Next Visit Plan Continue LE strengthening and balance activities per MPT POC.   PT Home Exercise Plan HEP- SLR, hip abduction, bridge   Consulted and Agree with Plan of Care Patient      Patient will benefit from skilled therapeutic intervention in order to improve the following deficits and impairments:  Decreased activity tolerance, Decreased strength, Decreased balance  Visit Diagnosis: Ataxic gait  Muscle weakness (generalized)  Other abnormalities of gait and mobility     Problem List Patient Active Problem List   Diagnosis Date Noted  . Abnormal CT scan 12/16/2015  . Diverticulitis 11/20/2015  . Depression 11/20/2015  . Dementia 11/20/2015  . Hypokalemia 11/20/2015  . CKD (chronic kidney disease), stage III 11/20/2015  . UTI (lower urinary tract infection)   . Aortic calcification ([place]) 02/26/2014  . Essential hypertension 02/26/2014  . Mixed hyperlipidemia 02/26/2014  . Normocytic anemia 07/20/2013  . Infection of urinary tract 06/22/2013  . Back ache 06/22/2013    Wynelle Fanny, PTA 03/06/2016, 3:27 PM  [facility] [address] [place], [place], [address] Phone: [phone]   Fax:  [phone]  Name: Deborah Rivers MRN: [id number] Date of Birth: [birth year]

## 2016-03-07 ENCOUNTER — Encounter: Payer: Medicare Other | Admitting: Gastroenterology

## 2016-03-08 ENCOUNTER — Encounter: Payer: Medicare Other | Admitting: Physical Therapy

## 2016-03-08 ENCOUNTER — Encounter: Payer: Medicare Other | Admitting: *Deleted

## 2016-03-13 ENCOUNTER — Ambulatory Visit: Payer: Medicare Other | Admitting: Physical Therapy

## 2016-03-13 ENCOUNTER — Encounter: Payer: Self-pay | Admitting: Physical Therapy

## 2016-03-13 DIAGNOSIS — M6281 Muscle weakness (generalized): Secondary | ICD-10-CM

## 2016-03-13 DIAGNOSIS — R2689 Other abnormalities of gait and mobility: Secondary | ICD-10-CM

## 2016-03-13 DIAGNOSIS — R260 Ataxic gait: Secondary | ICD-10-CM | POA: Diagnosis not present

## 2016-03-13 NOTE — Therapy (Signed)
[facility] [place], [place], [address] Phone: [phone]   Fax:  [phone]  Physical Therapy Treatment  Patient Details  Name: Deborah Rivers MRN: [id number] Date of Birth: Jan 15, 1925 Referring Provider: Esmond Plants MD.  Encounter Date: 03/13/2016      PT End of Session - 03/13/16 1536    Visit Number 12   Number of Visits 16   Date for PT Re-Evaluation 03/16/16   PT Start Time 1500   PT Stop Time 1524   PT Time Calculation (min) 24 min   Activity Tolerance Patient tolerated treatment well   Behavior During Therapy [facility] for tasks assessed/performed  talkative      Past Medical History:  Diagnosis Date  . Anemia   . Diverticulitis   . Essential hypertension   . GERD (gastroesophageal reflux disease)   . Hyperlipidemia   . Hypothyroidism   . Recurrent UTI     Past Surgical History:  Procedure Laterality Date  . APPENDECTOMY    . HERNIA REPAIR    . TOTAL VAGINAL HYSTERECTOMY      There were no vitals filed for this visit.      Subjective Assessment - 03/13/16 1451    Subjective Reports that she has numbness in L leg and L ankle that affects her walking some with less in RLE. Reports R knee burning. Reports improvements noted with her staggering less.   Pertinent History Previous left patellar fracture.   Currently in Pain? --  Reports numbness in BLE but especially in LLE but no other description and rating from patient            [facility] PT Assessment - 03/13/16 0001      Assessment   Medical Diagnosis Lumbar stenosis; balance.   Next MD Visit None scheduled     Precautions   Precautions Fall     Restrictions   Weight Bearing Restrictions No                     [facility] Adult PT Treatment/Exercise - 03/13/16 0001      Knee/Hip Exercises: Seated   Long Arc Quad Strengthening;Both;2 sets;10 reps;Weights   Long Arc Quad Weight 4 lbs.   Hamstring Curl Strengthening;Both;2  sets;10 reps   Hamstring Limitations red theraband     Knee/Hip Exercises: Supine   Bridges Limitations 2x10   Straight Leg Raises Strengthening;Both;2 sets;10 reps   Other Supine Knee/Hip Exercises B hip clamshell green theraband x20 reps                  PT Short Term Goals - 02/16/16 1435      PT SHORT TERM GOAL #1   Title Berg score= 47-48/56.   Time 4   Period Weeks   Status Achieved  BERG 49/56 02/16/16           PT Long Term Goals - 02/28/16 1418      PT LONG TERM GOAL #1   Title Independent with an HEP.   Time 8   Period Weeks   Status Achieved     PT LONG TERM GOAL #2   Title Berg score= 50/56.   Time 8   Period Weeks   Status On-going               Plan - 03/13/16 1538    Clinical Impression Statement Patient's treatment today limited with exercises as she arrived late. Patient had to guided back into exercises  throughout conversation. Patient able to complete exercises especially SLR with minimally improved strength noted. Patient required moderate multimodal cueing for technique and corrections throughout exercises. Patient continues to be encouraged to continue HEP at home.   Rehab Potential Good   PT Frequency 2x / week   PT Duration 8 weeks   PT Treatment/Interventions Functional mobility training;Stair training;Gait training;Therapeutic activities;Therapeutic exercise;Balance training;Neuromuscular re-education;Patient/family education   PT Next Visit Plan Continue LE strengthening and balance activities per MPT POC.   PT Home Exercise Plan HEP- SLR, hip abduction, bridge   Consulted and Agree with Plan of Care Patient      Patient will benefit from skilled therapeutic intervention in order to improve the following deficits and impairments:  Decreased activity tolerance, Decreased strength, Decreased balance  Visit Diagnosis: Ataxic gait  Muscle weakness (generalized)  Other abnormalities of gait and mobility     Problem  List Patient Active Problem List   Diagnosis Date Noted  . Abnormal CT scan 12/16/2015  . Diverticulitis 11/20/2015  . Depression 11/20/2015  . Dementia 11/20/2015  . Hypokalemia 11/20/2015  . CKD (chronic kidney disease), stage III 11/20/2015  . UTI (lower urinary tract infection)   . Aortic calcification ([place]) 02/26/2014  . Essential hypertension 02/26/2014  . Mixed hyperlipidemia 02/26/2014  . Normocytic anemia 07/20/2013  . Infection of urinary tract 06/22/2013  . Back ache 06/22/2013    Wynelle Fanny, PTA 03/13/2016, 3:42 PM  [facility] [address] [place], [place], [address] Phone: [phone]   Fax:  [phone]  Name: SUHAILA SARNA MRN: [id number] Date of Birth: 1924/12/20

## 2016-03-15 ENCOUNTER — Encounter: Payer: Medicare Other | Admitting: Physical Therapy

## 2016-03-20 ENCOUNTER — Ambulatory Visit: Payer: Medicare Other | Admitting: Physical Therapy

## 2016-03-20 ENCOUNTER — Encounter: Payer: Self-pay | Admitting: Physical Therapy

## 2016-03-20 DIAGNOSIS — M6281 Muscle weakness (generalized): Secondary | ICD-10-CM

## 2016-03-20 DIAGNOSIS — R2689 Other abnormalities of gait and mobility: Secondary | ICD-10-CM

## 2016-03-20 DIAGNOSIS — R260 Ataxic gait: Secondary | ICD-10-CM | POA: Diagnosis not present

## 2016-03-20 NOTE — Therapy (Signed)
[facility] [place], [place], [address] Phone: [phone]   Fax:  [phone]  Physical Therapy Treatment  Patient Details  Name: Deborah Rivers MRN: [id number] Date of Birth: 07/05/1924 Referring Provider: Esmond Plants MD.  Encounter Date: 03/20/2016      PT End of Session - 03/20/16 1447    Visit Number 13   Number of Visits 16   Date for PT Re-Evaluation 03/16/16   PT Start Time [id number]   PT Stop Time [id number]  2 units secondary to stopping exercise for conversation   PT Time Calculation (min) 42 min   Activity Tolerance Patient tolerated treatment well   Behavior During Therapy [facility] for tasks assessed/performed      Past Medical History:  Diagnosis Date  . Anemia   . Diverticulitis   . Essential hypertension   . GERD (gastroesophageal reflux disease)   . Hyperlipidemia   . Hypothyroidism   . Recurrent UTI     Past Surgical History:  Procedure Laterality Date  . APPENDECTOMY    . HERNIA REPAIR    . TOTAL VAGINAL HYSTERECTOMY      There were no vitals filed for this visit.      Subjective Assessment - 03/20/16 1445    Subjective Reports a good day with her leg and back yesterday. Reports wanting to sit around and cry at home.   Pertinent History Previous left patellar fracture.   Currently in Pain? No/denies            [facility] PT Assessment - 03/20/16 0001      Assessment   Medical Diagnosis Lumbar stenosis; balance.   Next MD Visit None scheduled     Precautions   Precautions Fall     Restrictions   Weight Bearing Restrictions No                     [facility] Adult PT Treatment/Exercise - 03/20/16 0001      Knee/Hip Exercises: Aerobic   Nustep L5 x 17 min UE/LE monitored for progression     Knee/Hip Exercises: Standing   Forward Step Up Left;1 set;10 reps;Hand Hold: 2;Step Height: 6"     Knee/Hip Exercises: Seated   Long Arc Quad Strengthening;Both;2 sets;10 reps;Weights   Long Arc Quad Weight 4 lbs.   Hamstring Curl Strengthening;Both;2 sets;10 reps   Hamstring Limitations red theraband   Sit to Sand 1 set;10 reps;without UE support                  PT Short Term Goals - 02/16/16 1435      PT SHORT TERM GOAL #1   Title Berg score= 47-48/56.   Time 4   Period Weeks   Status Achieved  BERG 49/56 02/16/16           PT Long Term Goals - 02/28/16 1418      PT LONG TERM GOAL #1   Title Independent with an HEP.   Time 8   Period Weeks   Status Achieved     PT LONG TERM GOAL #2   Title Berg score= 50/56.   Time 8   Period Weeks   Status On-going               Plan - 03/20/16 1550    Clinical Impression Statement Patient presented in clinic today with no current pain. Patient able to demonstrate improvements in strength with LAQ and with sit to stands as she required  less assist and able to stand without compensation with leaning back into plinth table. Patient required constant redirection to exercises from conversation.        Rehab Potential Good   PT Frequency 2x / week   PT Duration 8 weeks   PT Treatment/Interventions Functional mobility training;Stair training;Gait training;Therapeutic activities;Therapeutic exercise;Balance training;Neuromuscular re-education;Patient/family education   PT Next Visit Plan Continue LE strengthening and balance activities per MPT POC.   PT Home Exercise Plan HEP- SLR, hip abduction, bridge   Consulted and Agree with Plan of Care Patient      Patient will benefit from skilled therapeutic intervention in order to improve the following deficits and impairments:  Decreased activity tolerance, Decreased strength, Decreased balance  Visit Diagnosis: Ataxic gait  Muscle weakness (generalized)  Other abnormalities of gait and mobility     Problem List Patient Active Problem List   Diagnosis Date Noted  . Abnormal CT scan 12/16/2015  . Diverticulitis 11/20/2015  . Depression  11/20/2015  . Dementia 11/20/2015  . Hypokalemia 11/20/2015  . CKD (chronic kidney disease), stage III 11/20/2015  . UTI (lower urinary tract infection)   . Aortic calcification ([place]) 02/26/2014  . Essential hypertension 02/26/2014  . Mixed hyperlipidemia 02/26/2014  . Normocytic anemia 07/20/2013  . Infection of urinary tract 06/22/2013  . Back ache 06/22/2013    Wynelle Fanny, PTA 03/20/2016, 3:58 PM  [facility] [address] [place], [place], [address] Phone: [phone]   Fax:  [phone]  Name: ELANORA Rivers MRN: [id number] Date of Birth: 1924/08/01

## 2016-03-20 NOTE — Addendum Note (Signed)
Addended by: Alizza Sacra, [place] W on: 03/20/2016 04:14 PM   Modules accepted: Orders

## 2016-03-22 ENCOUNTER — Ambulatory Visit: Payer: Medicare Other | Attending: Orthopedic Surgery | Admitting: *Deleted

## 2016-03-22 DIAGNOSIS — R2689 Other abnormalities of gait and mobility: Secondary | ICD-10-CM | POA: Diagnosis present

## 2016-03-22 DIAGNOSIS — R260 Ataxic gait: Secondary | ICD-10-CM | POA: Diagnosis present

## 2016-03-22 DIAGNOSIS — M6281 Muscle weakness (generalized): Secondary | ICD-10-CM | POA: Diagnosis present

## 2016-03-22 NOTE — Therapy (Signed)
[facility] [place], [place], [address] Phone: [phone]   Fax:  [phone]  Physical Therapy Treatment  Patient Details  Name: Deborah Rivers MRN: [id number] Date of Birth: 1924-08-08 Referring Provider: Esmond Plants MD.  Encounter Date: 03/22/2016      PT End of Session - 03/22/16 1444    Visit Number 14   Number of Visits 16   Date for PT Re-Evaluation 05/15/16   PT Start Time [id number]   PT Stop Time 1522   PT Time Calculation (min) 47 min      Past Medical History:  Diagnosis Date  . Anemia   . Diverticulitis   . Essential hypertension   . GERD (gastroesophageal reflux disease)   . Hyperlipidemia   . Hypothyroidism   . Recurrent UTI     Past Surgical History:  Procedure Laterality Date  . APPENDECTOMY    . HERNIA REPAIR    . TOTAL VAGINAL HYSTERECTOMY      There were no vitals filed for this visit.      Subjective Assessment - 03/22/16 1435    Subjective Did good after last Rx. Pain isn't to bad today   Pertinent History Previous left patellar fracture.   Currently in Pain? Yes   Pain Score 2                          [facility] Adult PT Treatment/Exercise - 03/22/16 0001      Knee/Hip Exercises: Aerobic   Nustep L5 x 20  min UE/LE monitored for progression     Knee/Hip Exercises: Standing   Hip Flexion AROM;Left;2 sets;10 reps;Knee bent   Hip Abduction AROM;Both;1 set;10 reps   Forward Step Up 1 set;10 reps;Hand Hold: 2;Step Height: 6";Both   Rocker Board 3 minutes     Knee/Hip Exercises: Seated   Long Arc Quad Strengthening;Both;2 sets;10 reps;Weights   Long Arc Quad Weight 4 lbs.   Hamstring Curl --   Hamstring Limitations --   Sit to Sand 1 set;10 reps;without UE support                  PT Short Term Goals - 02/16/16 1435      PT SHORT TERM GOAL #1   Title Berg score= 47-48/56.   Time 4   Period Weeks   Status Achieved  BERG 49/56 02/16/16           PT Long Term Goals - 02/28/16 1418      PT LONG TERM GOAL #1   Title Independent with an HEP.   Time 8   Period Weeks   Status Achieved     PT LONG TERM GOAL #2   Title Berg score= 50/56.   Time 8   Period Weeks   Status On-going               Plan - 03/22/16 1458    Clinical Impression Statement Pt arrived to clinic feeling pretty good today with low pain levels. She was able to perform more therex today for LE strengthening and balance. Pt was able to perform sit to stand with only 2 LOB today.   Rehab Potential Good   PT Duration 8 weeks   PT Treatment/Interventions Functional mobility training;Stair training;Gait training;Therapeutic activities;Therapeutic exercise;Balance training;Neuromuscular re-education;Patient/family education   PT Next Visit Plan Continue LE strengthening and balance activities per MPT POC.   PT Home Exercise Plan HEP- SLR, hip abduction, bridge  Patient will benefit from skilled therapeutic intervention in order to improve the following deficits and impairments:  Decreased activity tolerance, Decreased strength, Decreased balance  Visit Diagnosis: Ataxic gait  Muscle weakness (generalized)  Other abnormalities of gait and mobility     Problem List Patient Active Problem List   Diagnosis Date Noted  . Abnormal CT scan 12/16/2015  . Diverticulitis 11/20/2015  . Depression 11/20/2015  . Dementia 11/20/2015  . Hypokalemia 11/20/2015  . CKD (chronic kidney disease), stage III 11/20/2015  . UTI (lower urinary tract infection)   . Aortic calcification ([place]) 02/26/2014  . Essential hypertension 02/26/2014  . Mixed hyperlipidemia 02/26/2014  . Normocytic anemia 07/20/2013  . Infection of urinary tract 06/22/2013  . Back ache 06/22/2013    Mamta Rimmer,CHRIS, PTA 03/22/2016, 3:29 PM  [facility] [address] [place], [place], [address] Phone: [phone]   Fax:  [phone]  Name:  Deborah Rivers MRN: [id number] Date of Birth: 1924/12/20

## 2016-03-23 ENCOUNTER — Telehealth: Payer: Self-pay | Admitting: Gastroenterology

## 2016-03-27 ENCOUNTER — Ambulatory Visit: Payer: Medicare Other | Admitting: *Deleted

## 2016-03-29 ENCOUNTER — Encounter: Payer: Self-pay | Admitting: Physical Therapy

## 2016-03-29 ENCOUNTER — Ambulatory Visit: Payer: Medicare Other | Admitting: Physical Therapy

## 2016-03-29 DIAGNOSIS — M6281 Muscle weakness (generalized): Secondary | ICD-10-CM

## 2016-03-29 DIAGNOSIS — R260 Ataxic gait: Secondary | ICD-10-CM

## 2016-03-29 DIAGNOSIS — R2689 Other abnormalities of gait and mobility: Secondary | ICD-10-CM

## 2016-03-29 NOTE — Therapy (Signed)
[facility] [place], [place], [address] Phone: [phone]   Fax:  [phone]  Physical Therapy Treatment  Patient Details  Name: Deborah Rivers MRN: [id number] Date of Birth: Dec 02, 1924 Referring Provider: Esmond Plants MD.  Encounter Date: 03/29/2016      PT End of Session - 03/29/16 1440    Visit Number 15   Number of Visits 16   Date for PT Re-Evaluation 05/15/16   PT Start Time [id number]   PT Stop Time 1524   PT Time Calculation (min) 46 min   Activity Tolerance Patient tolerated treatment well   Behavior During Therapy [facility] for tasks assessed/performed      Past Medical History:  Diagnosis Date  . Anemia   . Diverticulitis   . Essential hypertension   . GERD (gastroesophageal reflux disease)   . Hyperlipidemia   . Hypothyroidism   . Recurrent UTI     Past Surgical History:  Procedure Laterality Date  . APPENDECTOMY    . HERNIA REPAIR    . TOTAL VAGINAL HYSTERECTOMY      There were no vitals filed for this visit.      Subjective Assessment - 03/29/16 1439    Subjective Reports that she has seen some improvement with going up and down stairs and with stability in walking. Would like to continue coming to PT as she has not seen the results she thought she would yet.   Pertinent History Previous left patellar fracture.   Currently in Pain? No/denies            [facility] PT Assessment - 03/29/16 0001      Assessment   Medical Diagnosis Lumbar stenosis; balance.   Next MD Visit None scheduled     Precautions   Precautions Fall     Restrictions   Weight Bearing Restrictions No                     [facility] Adult PT Treatment/Exercise - 03/29/16 0001      Knee/Hip Exercises: Aerobic   Nustep L7 x15 min     Knee/Hip Exercises: Machines for Strengthening   Cybex Knee Extension 10# 2x10 reps   Cybex Knee Flexion 20# 3x10 reps     Knee/Hip Exercises: Standing   Forward Step Up Both;2  sets;10 reps;Hand Hold: 2;Step Height: 6"     Knee/Hip Exercises: Seated   Sit to Sand 10 reps;without UE support             Balance Exercises - 03/29/16 1522      Balance Exercises: Standing   Standing, One Foot on a Step Eyes open;Foam/compliant surface;6 inch  x2 min each no UE support             PT Short Term Goals - 02/16/16 1435      PT SHORT TERM GOAL #1   Title Berg score= 47-48/56.   Time 4   Period Weeks   Status Achieved  BERG 49/56 02/16/16           PT Long Term Goals - 02/28/16 1418      PT LONG TERM GOAL #1   Title Independent with an HEP.   Time 8   Period Weeks   Status Achieved     PT LONG TERM GOAL #2   Title Berg score= 50/56.   Time 8   Period Weeks   Status On-going  Plan - 03/29/16 1538    Clinical Impression Statement Patient tolerated today's treatment well as she was progressed to machine knee strengthening. Patient required frequent VCs to fully flex or fully extend knees for knee strengthening. Patient demonstrated greater weakness in Quads with machine strengthening. Patient required minimal VCs for wider BOS with sit to stands. Patient required max demonstration for proper sequencing of forward step ups for BLE. Patient continues to have instabilty with uneven surfaces and SLS.   Rehab Potential Good   PT Frequency 2x / week   PT Duration 8 weeks   PT Treatment/Interventions Functional mobility training;Stair training;Gait training;Therapeutic activities;Therapeutic exercise;Balance training;Neuromuscular re-education;Patient/family education   PT Next Visit Plan Continue LE strengthening and balance activities per MPT POC.   PT Home Exercise Plan HEP- SLR, hip abduction, bridge   Consulted and Agree with Plan of Care Patient      Patient will benefit from skilled therapeutic intervention in order to improve the following deficits and impairments:  Decreased activity tolerance, Decreased strength,  Decreased balance  Visit Diagnosis: Ataxic gait  Muscle weakness (generalized)  Other abnormalities of gait and mobility     Problem List Patient Active Problem List   Diagnosis Date Noted  . Abnormal CT scan 12/16/2015  . Diverticulitis 11/20/2015  . Depression 11/20/2015  . Dementia 11/20/2015  . Hypokalemia 11/20/2015  . CKD (chronic kidney disease), stage III 11/20/2015  . UTI (lower urinary tract infection)   . Aortic calcification ([place]) 02/26/2014  . Essential hypertension 02/26/2014  . Mixed hyperlipidemia 02/26/2014  . Normocytic anemia 07/20/2013  . Infection of urinary tract 06/22/2013  . Back ache 06/22/2013    Wynelle Fanny, PTA 03/29/2016, 3:49 PM  [facility] [place], [place], [address] Phone: [phone]   Fax:  [phone]  Name: Deborah Rivers MRN: [id number] Date of Birth: 1924-12-22

## 2016-04-03 ENCOUNTER — Other Ambulatory Visit (INDEPENDENT_AMBULATORY_CARE_PROVIDER_SITE_OTHER): Payer: Medicare Other

## 2016-04-03 ENCOUNTER — Ambulatory Visit (INDEPENDENT_AMBULATORY_CARE_PROVIDER_SITE_OTHER): Payer: Medicare Other | Admitting: Gastroenterology

## 2016-04-03 ENCOUNTER — Encounter: Payer: Self-pay | Admitting: Gastroenterology

## 2016-04-03 ENCOUNTER — Encounter (INDEPENDENT_AMBULATORY_CARE_PROVIDER_SITE_OTHER): Payer: Self-pay

## 2016-04-03 VITALS — BP 144/68 | HR 88 | Ht 63.0 in | Wt 110.6 lb

## 2016-04-03 DIAGNOSIS — D649 Anemia, unspecified: Secondary | ICD-10-CM

## 2016-04-03 DIAGNOSIS — R933 Abnormal findings on diagnostic imaging of other parts of digestive tract: Secondary | ICD-10-CM | POA: Diagnosis not present

## 2016-04-03 LAB — CBC WITH DIFFERENTIAL/PLATELET
BASOS PCT: 1.3 % (ref 0.0–3.0)
Basophils Absolute: 0.1 10*3/uL (ref 0.0–0.1)
EOS PCT: 2.3 % (ref 0.0–5.0)
Eosinophils Absolute: 0.2 10*3/uL (ref 0.0–0.7)
HEMATOCRIT: 34.9 % — AB (ref 36.0–46.0)
Hemoglobin: 11.7 g/dL — ABNORMAL LOW (ref 12.0–15.0)
LYMPHS ABS: 2.4 10*3/uL (ref 0.7–4.0)
LYMPHS PCT: 24.9 % (ref 12.0–46.0)
MCHC: 33.4 g/dL (ref 30.0–36.0)
MCV: 83.1 fl (ref 78.0–100.0)
MONOS PCT: 8.5 % (ref 3.0–12.0)
Monocytes Absolute: 0.8 10*3/uL (ref 0.1–1.0)
NEUTROS PCT: 63 % (ref 43.0–77.0)
Neutro Abs: 6 10*3/uL (ref 1.4–7.7)
PLATELETS: 297 10*3/uL (ref 150.0–400.0)
RBC: 4.2 Mil/uL (ref 3.87–5.11)
RDW: 15.2 % (ref 11.5–15.5)
WBC: 9.5 10*3/uL (ref 4.0–10.5)

## 2016-04-03 LAB — BASIC METABOLIC PANEL
BUN: 28 mg/dL — ABNORMAL HIGH (ref 6–23)
CALCIUM: 9.7 mg/dL (ref 8.4–10.5)
CO2: 30 mEq/L (ref 19–32)
CREATININE: 1.52 mg/dL — AB (ref 0.40–1.20)
Chloride: 104 mEq/L (ref 96–112)
GFR: 34.16 mL/min — ABNORMAL LOW (ref >60.00)
Glucose, Bld: 107 mg/dL — ABNORMAL HIGH (ref 70–99)
Potassium: 3.6 mEq/L (ref 3.5–5.1)
Sodium: 142 mEq/L (ref 135–145)

## 2016-04-03 LAB — IBC PANEL
IRON: 29 ug/dL — AB (ref 42–145)
Saturation Ratios: 8.8 % — ABNORMAL LOW (ref 20.0–50.0)
TRANSFERRIN: 235 mg/dL (ref 212.0–360.0)

## 2016-04-03 LAB — FERRITIN: Ferritin: 50.2 ng/mL (ref 10.0–291.0)

## 2016-04-03 NOTE — Patient Instructions (Signed)
Go to the basement for labs today   You have been scheduled for a CT scan of the abdomen and pelvis at [facility] ([address]---this is in the same building as Press [facility]).   You are scheduled on 04/09/2016 at 3:30pm. You should arrive 15 minutes prior to your appointment time for registration. Please follow the written instructions below on the day of your exam:  WARNING: IF YOU ARE ALLERGIC TO IODINE/X-RAY DYE, PLEASE NOTIFY RADIOLOGY IMMEDIATELY AT [phone]! YOU WILL BE GIVEN A 13 HOUR PREMEDICATION PREP.  1) Do not eat or drink anything after 11:30am (4 hours prior to your test) 2) You have been given 2 bottles of oral contrast to drink. The solution may taste               better if refrigerated, but do NOT add ice or any other liquid to this solution. Shake             well before drinking.    Drink 1 bottle of contrast @ 1:30pm (2 hours prior to your exam)  Drink 1 bottle of contrast @ 2:30pm (1 hour prior to your exam)  You may take any medications as prescribed with a small amount of water except for the following: Metformin, Glucophage, Glucovance, Avandamet, Riomet, Fortamet, Actoplus Met, Janumet, Glumetza or Metaglip. The above medications must be held the day of the exam AND 48 hours after the exam.  The purpose of you drinking the oral contrast is to aid in the visualization of your intestinal tract. The contrast solution may cause some diarrhea. Before your exam is started, you will be given a small amount of fluid to drink. Depending on your individual set of symptoms, you may also receive an intravenous injection of x-ray contrast/dye. Plan on being at [facility] for 30 minutes or longer, depending on the type of exam you are having performed.  This test typically takes 30-45 minutes to complete.  If you have any questions regarding your exam or if you need to reschedule, you may call the CT department at [phone] between the hours of  8:00 am and 5:00 pm, Monday-Friday.  ________________________________________________________________________

## 2016-04-03 NOTE — Progress Notes (Signed)
Reviewed and agree with management plan.  Cecile Gillispie T. Hyun Reali, MD FACG 

## 2016-04-03 NOTE — Progress Notes (Signed)
04/03/2016 Deborah Rivers [id number] 1924/03/26   HISTORY OF PRESENT ILLNESS:  Patient is here for follow-up regarding abnormal CT scan.  She was treated for diverticulitis in September and there was some unusual finding in the colon on CT scan for which they recommended follow-up colonoscopy.  That was scheduled when she was seen here by me in October but never proceeded with colonoscopy.  Also had a new anemia.  Denies blood in her stools.  Is somewhat reluctant to do colonoscopy.  Is here today with her brother-in-law.  Is somewhat confused; does not remember being seen here in October.  Please see my note from 12/16/2015 regarding further details.   Past Medical History:  Diagnosis Date  . Anemia   . Diverticulitis   . Essential hypertension   . GERD (gastroesophageal reflux disease)   . Hyperlipidemia   . Hypothyroidism   . Recurrent UTI    Past Surgical History:  Procedure Laterality Date  . APPENDECTOMY    . HERNIA REPAIR    . TOTAL VAGINAL HYSTERECTOMY      reports that she has never smoked. She has never used smokeless tobacco. She reports that she does not drink alcohol or use drugs. family history includes Cancer in her sister; Heart attack in her brother, father, maternal grandfather, maternal grandmother, paternal grandfather, and paternal grandmother; Stroke in her mother. Allergies  Allergen Reactions  . Sulfa Antibiotics Nausea Only, Anxiety and Rash      Outpatient Encounter Prescriptions as of 04/03/2016  Medication Sig  . acetaminophen (TYLENOL) 325 MG tablet Take 2 tablets (650 mg total) by mouth every 6 (six) hours as needed for mild pain (or Fever >/= 101).  Marland Kitchen amLODipine (NORVASC) 5 MG tablet Take 5 mg by mouth daily.  Marland Kitchen aspirin 81 MG tablet Take 81 mg by mouth daily.  Marland Kitchen atorvastatin (LIPITOR) 20 MG tablet Take 20 mg by mouth daily at 6 PM.   . DULoxetine (CYMBALTA) 30 MG capsule Take 30 mg by mouth daily. Take along with 60 mg tablet=90 mg  .  DULoxetine (CYMBALTA) 60 MG capsule Take 60 mg by mouth daily. Take along with 30 mg tablet=90 mg  . ergocalciferol (VITAMIN D2) 50000 UNITS capsule Take 50,000 Units by mouth once a week. Mon  . LORazepam (ATIVAN) 0.5 MG tablet Take 0.25 mg by mouth at bedtime.   Marland Kitchen SYNTHROID 50 MCG tablet Take 1 tablet by mouth daily.  Marland Kitchen triamcinolone (NASACORT AQ) 55 MCG/ACT AERO nasal inhaler Place 1 spray into the nose daily as needed (allergies).   . [DISCONTINUED] meloxicam (MOBIC) 7.5 MG tablet Take 1 tablet (7.5 mg total) by mouth daily as needed for pain.  . [DISCONTINUED] Potassium Chloride ER 20 MEQ TBCR Take 1 tablet by mouth daily.   No facility-administered encounter medications on file as of 04/03/2016.     REVIEW OF SYSTEMS  : All other systems reviewed and negative except where noted in the History of Present Illness.   PHYSICAL EXAM: BP (!) 144/68   Pulse 88   Ht 5\' 3"  (1.6 m)   Wt 110 lb 9.6 oz (50.2 kg)   BMI 19.59 kg/m  General: Well developed white female in no acute distress Head: Normocephalic and atraumatic Eyes:  Sclerae anicteric, conjunctiva pink. Ears: Normal auditory acuity Lungs: Clear throughout to auscultation Heart: Regular rate and rhythm Abdomen: Soft, non-distended. Normal bowel sounds.  Mild left sided TTP. Musculoskeletal: Symmetrical with no gross deformities  Skin: No lesions on visible  extremities Extremities: No edema  Neurological: Alert oriented x 4, grossly non-focal Psychological:  Alert and cooperative. Normal mood and affect  ASSESSMENT AND PLAN: -83 year old female who was treated for diverticulitis as seen on CT scan in 10/2015.  CT scan also showing an abrupt change in caliber between sigmoid colon and the rectum.  Discussed with Dr. Fuller Plan.  She was scheduled for colonoscopy but never proceeded.  Here today to re-discuss/re-schedule.  Patient somewhat reluctant for colonoscopy.  We decided to proceed with repeat CT scan abdomen and pelvis with  contrast to see if there is persistent abnormalities now that acute diverticulitis was treated.  Decision regarding colonoscopy will be made pending those results. -Normocytic anemia:  Most recent Hgb 10.1 grams.  Previously in 11-12 gram range.  Will recheck CBC today along with iron studies.  CC:  [name], MD

## 2016-04-04 ENCOUNTER — Ambulatory Visit: Payer: Medicare Other | Admitting: Physical Therapy

## 2016-04-09 ENCOUNTER — Ambulatory Visit (INDEPENDENT_AMBULATORY_CARE_PROVIDER_SITE_OTHER)
Admission: RE | Admit: 2016-04-09 | Discharge: 2016-04-09 | Disposition: A | Discharge status: Home / Self Care | Payer: Medicare Other | Source: Ambulatory Visit | Attending: Gastroenterology | Admitting: Gastroenterology

## 2016-04-09 DIAGNOSIS — R933 Abnormal findings on diagnostic imaging of other parts of digestive tract: Secondary | ICD-10-CM

## 2016-04-09 DIAGNOSIS — D649 Anemia, unspecified: Secondary | ICD-10-CM

## 2016-04-09 MED ORDER — IOPAMIDOL (ISOVUE-300) INJECTION 61%
65.0000 mL | Freq: Once | INTRAVENOUS | Status: AC | PRN
  Administered 2016-04-09: 65 mL via INTRAVENOUS

## 2016-04-12 ENCOUNTER — Encounter: Payer: Medicare Other | Admitting: Gastroenterology

## 2016-04-17 ENCOUNTER — Encounter: Payer: Medicare Other | Admitting: Physical Therapy

## 2016-04-24 ENCOUNTER — Encounter: Payer: Medicare Other | Admitting: Physical Therapy

## 2016-04-25 ENCOUNTER — Ambulatory Visit (HOSPITAL_COMMUNITY)
Admission: RE | Admit: 2016-04-25 | Discharge: 2016-04-25 | Disposition: A | Discharge status: Home / Self Care | Payer: Medicare Other | Source: Ambulatory Visit | Attending: Family Medicine | Admitting: Family Medicine

## 2016-04-25 ENCOUNTER — Other Ambulatory Visit (HOSPITAL_COMMUNITY): Payer: Self-pay | Admitting: Family Medicine

## 2016-04-25 DIAGNOSIS — S60222A Contusion of left hand, initial encounter: Secondary | ICD-10-CM | POA: Insufficient documentation

## 2016-04-25 DIAGNOSIS — M8588 Other specified disorders of bone density and structure, other site: Secondary | ICD-10-CM | POA: Insufficient documentation

## 2016-04-26 ENCOUNTER — Ambulatory Visit: Payer: Medicare Other | Attending: Orthopedic Surgery | Admitting: Physical Therapy

## 2016-04-26 ENCOUNTER — Encounter: Payer: Self-pay | Admitting: Physical Therapy

## 2016-04-26 DIAGNOSIS — R2689 Other abnormalities of gait and mobility: Secondary | ICD-10-CM | POA: Diagnosis present

## 2016-04-26 DIAGNOSIS — R260 Ataxic gait: Secondary | ICD-10-CM | POA: Diagnosis present

## 2016-04-26 DIAGNOSIS — M6281 Muscle weakness (generalized): Secondary | ICD-10-CM | POA: Diagnosis present

## 2016-04-26 NOTE — Therapy (Signed)
[facility] [place], [place], [address] Phone: [phone]   Fax:  [phone]  Physical Therapy Treatment  Patient Details  Name: Deborah Rivers MRN: [id number] Date of Birth: 11/18/1924 Referring Provider: Esmond Plants MD.  Encounter Date: 04/26/2016      PT End of Session - 04/26/16 1513    Visit Number 16   Number of Visits 32   Date for PT Re-Evaluation 05/15/16   PT Start Time 5188   PT Stop Time 1612   PT Time Calculation (min) 49 min   Activity Tolerance Patient tolerated treatment well   Behavior During Therapy [facility] for tasks assessed/performed      Past Medical History:  Diagnosis Date  . Anemia   . Diverticulitis   . Essential hypertension   . GERD (gastroesophageal reflux disease)   . Hyperlipidemia   . Hypothyroidism   . Recurrent UTI     Past Surgical History:  Procedure Laterality Date  . APPENDECTOMY    . HERNIA REPAIR    . TOTAL VAGINAL HYSTERECTOMY      There were no vitals filed for this visit.      Subjective Assessment - 04/26/16 1513    Subjective States that she has noticed she is walking better but since fall several days ago she says she is not going to walk in her yard anymore. X rays on her hand came back okay. Reports that Dr. Edrick Oh has told her to bring cane to PT.   Pertinent History Previous left patellar fracture.   Currently in Pain? No/denies            [facility] PT Assessment - 04/26/16 0001      Assessment   Medical Diagnosis Lumbar stenosis; balance.   Next MD Visit None scheduled     Precautions   Precautions Fall     Restrictions   Weight Bearing Restrictions No                     [facility] Adult PT Treatment/Exercise - 04/26/16 0001      Knee/Hip Exercises: Aerobic   Nustep L7 x15 min     Knee/Hip Exercises: Machines for Strengthening   Cybex Knee Extension 10# 2x10 reps   Cybex Knee Flexion 30# 2x10 reps     Knee/Hip Exercises:  Seated   Sit to Sand 10 reps;without UE support             Balance Exercises - 04/26/16 1633      Balance Exercises: Standing   Tandem Stance Eyes open;Foam/compliant surface;Intermittent upper extremity support  x3 min   Standing, One Foot on a Step Eyes open;Foam/compliant surface;6 inch  x2 min each   Other Standing Exercises x5 min BOSU with one UE support             PT Short Term Goals - 02/16/16 1435      PT SHORT TERM GOAL #1   Title Berg score= 47-48/56.   Time 4   Period Weeks   Status Achieved  BERG 49/56 02/16/16           PT Long Term Goals - 02/28/16 1418      PT LONG TERM GOAL #1   Title Independent with an HEP.   Time 8   Period Weeks   Status Achieved     PT LONG TERM GOAL #2   Title Berg score= 50/56.   Time 8   Period Weeks  Status On-going               Plan - 04/26/16 1635    Clinical Impression Statement Patient tolerated today's treatment well with no reports of any LE pain. Patient requires frequent redirection to exercises due to conversation. Patient required multiple VCs on machine strengthening to fully extend or flex knees. Patient required cueing for ways to complete sit to stands easier such as sitting close to edge, leaning forward, wider BOS that she has been educated on in several previous treatments. Patient observed as ambulating much steadier now with no stumbling actions. Patient required at least one hand hold on parallel bars for balance activities and able to complete foot on step with airex with greater stability than in previous treatments. Patient encouraged to bring cane to next treatment to teach her how to use properly and to complete HEP.   Rehab Potential Good   PT Frequency 2x / week   PT Duration 8 weeks   PT Treatment/Interventions Functional mobility training;Stair training;Gait training;Therapeutic activities;Therapeutic exercise;Balance training;Neuromuscular re-education;Patient/family  education   PT Next Visit Plan Continue LE strengthening and balance activities per MPT POC.   PT Home Exercise Plan HEP- SLR, hip abduction, bridge   Consulted and Agree with Plan of Care Patient      Patient will benefit from skilled therapeutic intervention in order to improve the following deficits and impairments:  Decreased activity tolerance, Decreased strength, Decreased balance  Visit Diagnosis: Ataxic gait  Muscle weakness (generalized)  Other abnormalities of gait and mobility     Problem List Patient Active Problem List   Diagnosis Date Noted  . Abnormal CT scan, colon 12/16/2015  . Diverticulitis 11/20/2015  . Depression 11/20/2015  . Dementia 11/20/2015  . Hypokalemia 11/20/2015  . CKD (chronic kidney disease), stage III 11/20/2015  . UTI (lower urinary tract infection)   . Aortic calcification ([place]) 02/26/2014  . Essential hypertension 02/26/2014  . Mixed hyperlipidemia 02/26/2014  . Anemia 07/20/2013  . Infection of urinary tract 06/22/2013  . Back ache 06/22/2013    Wynelle Fanny, PTA 04/26/2016, 4:41 PM  [facility] [address] [place], [place], [address] Phone: [phone]   Fax:  [phone]  Name: Deborah Rivers MRN: [id number] Date of Birth: 02-27-1924

## 2016-05-01 ENCOUNTER — Encounter: Payer: Self-pay | Admitting: Physical Therapy

## 2016-05-01 ENCOUNTER — Ambulatory Visit: Payer: Medicare Other | Admitting: Physical Therapy

## 2016-05-01 NOTE — Therapy (Signed)
[facility] [place], [place], [address] Phone: [phone]   Fax:  [phone]  Physical Therapy Treatment  Patient Details  Name: Deborah Rivers MRN: [id number] Date of Birth: [birth year] Referring Provider: Esmond Plants MD.  Encounter Date: 05/01/2016      PT End of Session - 05/01/16 1454    Visit Number --   Number of Visits --   Date for PT Re-Evaluation --      Past Medical History:  Diagnosis Date  . Anemia   . Diverticulitis   . Essential hypertension   . GERD (gastroesophageal reflux disease)   . Hyperlipidemia   . Hypothyroidism   . Recurrent UTI     Past Surgical History:  Procedure Laterality Date  . APPENDECTOMY    . HERNIA REPAIR    . TOTAL VAGINAL HYSTERECTOMY      There were no vitals filed for this visit.      Subjective Assessment - 05/01/16 1454    Pertinent History Previous left patellar fracture.             Patient arrived late for treatment and upon arrival into therapy clinic expressed great concern regarding bills that she has been recieiving. It was later found that the bills were from her copays upon arrival for prior treatments. Patient was very concerned regarding the money she was paying. Mali Applegate, MPT and PTA educated patient regarding facility's self directed gym program and patient elected to discontinue PT and start gym program for financial reasons.                      PT Short Term Goals - 02/16/16 1435      PT SHORT TERM GOAL #1   Title Berg score= 47-48/56.   Time 4   Period Weeks   Status Achieved  BERG 49/56 02/16/16           PT Long Term Goals - 02/28/16 1418      PT LONG TERM GOAL #1   Title Independent with an HEP.   Time 8   Period Weeks   Status Achieved     PT LONG TERM GOAL #2   Title Berg score= 50/56.   Time 8   Period Weeks   Status On-going             Patient will benefit from skilled  therapeutic intervention in order to improve the following deficits and impairments:     Visit Diagnosis: Ataxic gait  Muscle weakness (generalized)  Other abnormalities of gait and mobility     Problem List Patient Active Problem List   Diagnosis Date Noted  . Abnormal CT scan, colon 12/16/2015  . Diverticulitis 11/20/2015  . Depression 11/20/2015  . Dementia 11/20/2015  . Hypokalemia 11/20/2015  . CKD (chronic kidney disease), stage III 11/20/2015  . UTI (lower urinary tract infection)   . Aortic calcification ([place]) 02/26/2014  . Essential hypertension 02/26/2014  . Mixed hyperlipidemia 02/26/2014  . Anemia 07/20/2013  . Infection of urinary tract 06/22/2013  . Back ache 06/22/2013    Ahmed Prima, PTA 05/01/16 3:58 PM  [facility] [address] [place], [place], [address] Phone: [phone]   Fax:  [phone]  Name: ANYSIA CHOI MRN: [id number] Date of Birth: 1924/07/04  PHYSICAL THERAPY DISCHARGE SUMMARY  Visits from Start of Care:   Current functional level related to goals / functional outcomes: See above.   Remaining deficits:  Patient nearly met her Berg score goal.   Education / Equipment: HEP. Plan: Patient agrees to discharge.  Patient goals were partially met. Patient is being discharged due to financial reasons.  ?????        Mali Applegate MPT

## 2016-05-03 ENCOUNTER — Encounter: Payer: Medicare Other | Admitting: *Deleted

## 2016-05-10 ENCOUNTER — Ambulatory Visit (INDEPENDENT_AMBULATORY_CARE_PROVIDER_SITE_OTHER): Payer: Medicare Other | Admitting: Gastroenterology

## 2016-05-10 ENCOUNTER — Encounter: Payer: Self-pay | Admitting: Gastroenterology

## 2016-05-10 ENCOUNTER — Encounter (INDEPENDENT_AMBULATORY_CARE_PROVIDER_SITE_OTHER): Payer: Self-pay

## 2016-05-10 VITALS — BP 140/62 | HR 80 | Ht 63.0 in | Wt 115.0 lb

## 2016-05-10 DIAGNOSIS — K5730 Diverticulosis of large intestine without perforation or abscess without bleeding: Secondary | ICD-10-CM

## 2016-05-10 DIAGNOSIS — R933 Abnormal findings on diagnostic imaging of other parts of digestive tract: Secondary | ICD-10-CM

## 2016-05-10 NOTE — Progress Notes (Signed)
    History of Present Illness: This is an 83 year old female treated for diverticulitis in September 2017 with an abnormal CT scan of the colon in September. Repeat CT scan in February showed resolution of the large bowel thickening-see below. She has been reluctant to undergo colonoscopy for further evaluation. She is here today accompanied by her son-in-law. She has no gastrointestinal complaints.  abd/pelvic CT 04/10/2016 IMPRESSION: 1. No acute abnormality. Diffuse colonic diverticulosis, most prominent in the sigmoid colon. Resolution of large bowel wall thickening. 2. Aortic atherosclerosis.  abd/pelvic CT 11/19/2015 IMPRESSION: Diverticulosis of the sigmoid colon with inflammatory changes consistent with acute diverticulitis. No abscess. There is an abrupt change in caliber between sigmoid colon and the rectum. Recommend evaluation of the colon after resolution of the acute process to exclude underlying colon cancer. Diffuse fatty infiltration of the liver. Moderate-sized esophageal hiatal hernia with possible esophageal reflux disease.  Current Medications, Allergies, Past Medical History, Past Surgical History, Family History and Social History were reviewed in [employer] record.  Physical Exam: General: Well developed, well nourished, no acute distress Head: Normocephalic and atraumatic Eyes:  sclerae anicteric, EOMI Ears: Normal auditory acuity Mouth: No deformity or lesions Lungs: Clear throughout to auscultation Heart: Regular rate and rhythm; no murmurs, rubs or bruits Abdomen: Soft, non tender and non distended. No masses, hepatosplenomegaly or hernias noted. Normal Bowel sounds Musculoskeletal: Symmetrical with no gross deformities  Neurological: Alert oriented x 4, grossly nonfocal Psychological:  Alert and cooperative. Normal mood and affect  Assessment and Recommendations:  1. Abnormal CT of the sigmoid colon likely secondary to diverticulosis.  Rule out colon neoplasm or other disorder. Prior episode of diverticulitis has resolved. I have recommended colonoscopy as first choice for evaluation and an air-contrast barium enema as the second choice for evaluation. Discussed the pros and cons of further evaluation. She is unsure whether she wants to have further evaluation and wants to go home and discuss with her family. I spent 15 minutes of face-to-face time with the patient. Greater than 50% of the time was spent counseling and coordinating care.

## 2016-05-10 NOTE — Patient Instructions (Signed)
It has been recommended to you by your physician that you have a(n) Colonoscopy completed. Per your request, we did not schedule the procedure(s) today. Please contact our office at [phone] should you decide to have the procedure completed.  Thank you for choosing me and [facility].  Malcolm T. Stark, Jr., MD., FACG  

## 2016-12-03 ENCOUNTER — Ambulatory Visit (INDEPENDENT_AMBULATORY_CARE_PROVIDER_SITE_OTHER): Payer: Medicare Other | Admitting: Family Medicine

## 2016-12-03 ENCOUNTER — Encounter: Payer: Self-pay | Admitting: Family Medicine

## 2016-12-03 VITALS — BP 121/75 | HR 82 | Temp 97.4°F | Ht 63.0 in | Wt 113.2 lb

## 2016-12-03 DIAGNOSIS — F39 Unspecified mood [affective] disorder: Secondary | ICD-10-CM

## 2016-12-03 MED ORDER — SYNTHROID 50 MCG PO TABS: 50.0000 ug | ORAL_TABLET | Freq: Every day | ORAL | 3 refills | Status: DC

## 2016-12-03 NOTE — Patient Instructions (Signed)
Great to see you!  It is ok to take 30 mg of cymbalta once daily for about 2 weeks then stop. Come back to me in 3-4 weeks for follow up.

## 2016-12-03 NOTE — Progress Notes (Signed)
   HPI  Patient presents today here to establish care with anxiety or depression.  Patient states she takes Ativan one half pill once per night to sleep. She states that she was started on Cymbalta and titrated up to 90 mg. She would like to stop the medication. She states that she does not have any depression or persistent anxiety.   He has hypertension, hypothyroidism Reports good medication compliance, no chest pain.  Patient is currently taking 60 mg of Cymbalta 30 mg of Cymbalta every other day alternating. She would very much like to stop. She did well for years on Zoloft previous to this  PMH: Chronic kidney disease, depression, hypertension, hypothyroidism Surgical history: Appendectomy, hernia repair, hysterectomy Family history: Heart disease in MGF, MG, PGF, PGM, brother, father, mother with stroke Social history: No alcohol or smoking ROS: Per HPI  Objective: BP 121/75   Pulse 82   Temp (!) 97.4 F (36.3 C) (Oral)   Ht 5\' 3"  (1.6 m)   Wt 113 lb 3.2 oz (51.3 kg)   BMI 20.05 kg/m  Gen: NAD, alert, cooperative with exam HEENT: NCAT CV: RRR, good S1/S2, no murmur Resp: CTABL, no wheezes, non-labored Abd: SNTND, BS present, no guarding or organomegaly Ext: No edema, warm Neuro: Alert and oriented, No gross deficits  TSH measured at 1.45 on 11/19/2016  Assessment and plan:  # Mood disorder Unclear mood disorder, patient states that she was started for depression, however she had some "issues with her nerves". She would like to stop. He is currently taking 60 mg and 30 mg alternating daily, changed to 30 mg daily then stop. Follow-up 2 weeks later, may need to restart SSRI   Meds ordered this encounter  Medications  . Multiple Vitamins-Minerals (CENTRUM SILVER PO)    Sig: Take 1 tablet by mouth daily.  . potassium chloride (KLOR-CON) 20 MEQ packet    Sig: Take by mouth 2 (two) times daily.    Laroy Apple, MD [facility] 12/03/2016,  2:01 PM

## 2016-12-07 ENCOUNTER — Other Ambulatory Visit: Payer: Self-pay | Admitting: Family Medicine

## 2016-12-10 NOTE — Telephone Encounter (Signed)
Phoned in.

## 2016-12-24 ENCOUNTER — Ambulatory Visit (INDEPENDENT_AMBULATORY_CARE_PROVIDER_SITE_OTHER): Payer: Medicare Other | Admitting: Family Medicine

## 2016-12-24 ENCOUNTER — Encounter: Payer: Self-pay | Admitting: Family Medicine

## 2016-12-24 VITALS — BP 135/71 | HR 76 | Temp 97.3°F | Ht 63.0 in | Wt 115.6 lb

## 2016-12-24 DIAGNOSIS — F39 Unspecified mood [affective] disorder: Secondary | ICD-10-CM

## 2016-12-24 NOTE — Progress Notes (Signed)
   HPI  Patient presents today for follow-up mood disorder.  Patient is decreased to 30 mg of Cymbalta, she did not discontinue.  She feels well.  She still states that she does not have a problem with depression but she does have a "problem with her nerves". She is tolerating medication well.  We discussed indications and reasons to take Cymbalta, that she did not know that it could also help with pain.  She is still considering discontinuing that, we discussed a safe discontinuation and restart if needed.   PMH: Smoking status noted ROS: Per HPI  Objective: BP 135/71   Pulse 76   Temp (!) 97.3 F (36.3 C) (Oral)   Ht 5\' 3"  (1.6 m)   Wt 115 lb 9.6 oz (52.4 kg)   BMI 20.48 kg/m  Gen: NAD, alert, cooperative with exam HEENT: NCAT, EOMI, PERRL CV: RRR, good S1/S2, no murmur Resp: CTABL, no wheezes, non-labored Abd: SNTND, BS present, no guarding or organomegaly Ext: No edema, warm Neuro: Alert and oriented, No gross deficits  Labs from 11/19/2016: ( [facility])  WBC 6.9, hemoglobin 10.9, MCV 85 Glucose 85, creatinine 1.72, GFR 26, sodium 146, CO2 28 TSH 1.45 Lipid panel from 09/26/2016: Cholesterol total 171, triglycerides 76, HDL 84, LDL 72  Assessment and plan:  #Mood disorder Mostly anxiety, she also has some back pain we discussed today. (Sounds like muscle spasms from chronic OA, states it has been going on for 4 years plus) Continue Cymbalta 30 mg, we discussed stopping the medication at the last visit which seemed to be her preference, however she has not stopped it.  I believe that she is actually doing quite well on the medication and I have encouraged her to continue.  I also discussed with her a safe way to stop medication and restart(at this dose would simply be stopping and restarting if wanted).       Meds ordered this encounter  Medications  . DULoxetine (CYMBALTA) 30 MG capsule    Sig: Take 30 mg daily by mouth.    Laroy Apple, MD [facility] 12/24/2016, 5:23 PM

## 2017-02-16 ENCOUNTER — Telehealth: Payer: Self-pay | Admitting: Family Medicine

## 2017-02-16 MED ORDER — AZITHROMYCIN 250 MG PO TABS: ORAL_TABLET | ORAL | 0 refills | Status: DC

## 2017-02-16 NOTE — Telephone Encounter (Signed)
Please advise 

## 2017-02-16 NOTE — Telephone Encounter (Signed)
What symptoms do you have? Coughing, neck hurts from coughing so much, congested  How long have you been sick? 02/14/17  Have you been seen for this problem? No, she has been taking muccinex and is not feeling better  If your provider decides to give you a prescription, which pharmacy would you like for it to be sent to? [facility]   Patient informed that this information will be sent to the clinical staff for review and that they should receive a follow up call.

## 2017-02-18 DIAGNOSIS — T7840XA Allergy, unspecified, initial encounter: Secondary | ICD-10-CM | POA: Insufficient documentation

## 2017-02-18 DIAGNOSIS — F419 Anxiety disorder, unspecified: Secondary | ICD-10-CM | POA: Insufficient documentation

## 2017-02-18 DIAGNOSIS — F329 Major depressive disorder, single episode, unspecified: Secondary | ICD-10-CM | POA: Insufficient documentation

## 2017-02-18 DIAGNOSIS — E039 Hypothyroidism, unspecified: Secondary | ICD-10-CM | POA: Insufficient documentation

## 2017-02-18 DIAGNOSIS — F32A Depression, unspecified: Secondary | ICD-10-CM | POA: Insufficient documentation

## 2017-02-18 DIAGNOSIS — N390 Urinary tract infection, site not specified: Secondary | ICD-10-CM | POA: Insufficient documentation

## 2017-02-18 DIAGNOSIS — K219 Gastro-esophageal reflux disease without esophagitis: Secondary | ICD-10-CM | POA: Insufficient documentation

## 2017-02-18 DIAGNOSIS — H269 Unspecified cataract: Secondary | ICD-10-CM | POA: Insufficient documentation

## 2017-02-18 DIAGNOSIS — E785 Hyperlipidemia, unspecified: Secondary | ICD-10-CM | POA: Insufficient documentation

## 2017-03-01 ENCOUNTER — Ambulatory Visit: Payer: Medicare Other | Admitting: Cardiovascular Disease

## 2017-03-06 ENCOUNTER — Ambulatory Visit: Payer: Medicare Other | Admitting: Cardiovascular Disease

## 2017-03-12 ENCOUNTER — Ambulatory Visit: Payer: Medicare Other | Admitting: Cardiovascular Disease

## 2017-03-12 ENCOUNTER — Encounter: Payer: Self-pay | Admitting: Cardiovascular Disease

## 2017-03-12 VITALS — BP 136/70 | HR 69 | Ht 63.0 in | Wt 114.0 lb

## 2017-03-12 DIAGNOSIS — E782 Mixed hyperlipidemia: Secondary | ICD-10-CM

## 2017-03-12 DIAGNOSIS — I10 Essential (primary) hypertension: Secondary | ICD-10-CM

## 2017-03-12 NOTE — Assessment & Plan Note (Signed)
History of essential hypertension blood pressure 136/70. She is on Norvasc. Continue current meds at current dosing.

## 2017-03-12 NOTE — Assessment & Plan Note (Signed)
History of hyperlipidemia on statin therapy followed by her PCP. 

## 2017-03-12 NOTE — Patient Instructions (Signed)

## 2017-03-12 NOTE — Progress Notes (Signed)
03/12/2017 Deborah Rivers   10-26-1924  [id number]  Primary Physician Timmothy Euler, MD Primary Cardiologist: Lorretta Harp MD Deborah Rivers, [place]  HPI:  Deborah Rivers is a 84 y.o.  thin-appearing widowed Caucasian female mother of 2 children, grandmother of 4 grandchildren whose primary care physician is Dr. Wendi Rivers. She was referred to be established for ongoing cardiovascular care. I last saw her in the office 02/29/16  Her cardiovascular risk factor profile is notable only for essential hypertension and hyperlipidemia as well as family history. Her father had myocardial infarction as typical of her brothers. She has never had a heart attack or stroke . She also denies claudication. She has walked on the treadmill without limitation. She had an x-ray performed in orthopedic surgeon's office was remarkable for "atherosclerosis". Since I saw her a year ago she has been asymptomatic denying chest pain or shortness of breath.     Current Meds  Medication Sig  . acetaminophen (TYLENOL) 325 MG tablet Take 2 tablets (650 mg total) by mouth every 6 (six) hours as needed for mild pain (or Fever >/= 101).  Marland Kitchen amLODipine (NORVASC) 5 MG tablet Take 2.5 mg by mouth daily.   Marland Kitchen aspirin 81 MG tablet Take 81 mg by mouth daily.  Marland Kitchen atorvastatin (LIPITOR) 20 MG tablet Take 20 mg by mouth daily at 6 PM.   . azithromycin (ZITHROMAX) 250 MG tablet Take as directed  . DULoxetine (CYMBALTA) 30 MG capsule Take 30 mg daily by mouth.  . ergocalciferol (VITAMIN D2) 50000 UNITS capsule Take 50,000 Units by mouth once a week. Mon  . LORazepam (ATIVAN) 0.5 MG tablet Take 1 Tablet by mouth every 6 hours as needed  . Multiple Vitamins-Minerals (CENTRUM SILVER PO) Take 1 tablet by mouth daily.  Marland Kitchen SYNTHROID 50 MCG tablet Take 1 tablet (50 mcg total) by mouth daily.     Allergies  Allergen Reactions  . Sulfa Antibiotics Nausea Only, Anxiety and Rash    Social History   Socioeconomic History  .  Marital status: Widowed    Spouse name: Not on file  . Number of children: 2  . Years of education: Not on file  . Highest education level: Not on file  Social Needs  . Financial resource strain: Not on file  . Food insecurity - worry: Not on file  . Food insecurity - inability: Not on file  . Transportation needs - medical: Not on file  . Transportation needs - non-medical: Not on file  Occupational History  . Occupation: Retried  Tobacco Use  . Smoking status: Never Smoker  . Smokeless tobacco: Never Used  Substance and Sexual Activity  . Alcohol use: No    Alcohol/week: 0.0 oz  . Drug use: No  . Sexual activity: No  Other Topics Concern  . Not on file  Social History Narrative  . Not on file     Review of Systems: General: negative for chills, fever, night sweats or weight changes.  Cardiovascular: negative for chest pain, dyspnea on exertion, edema, orthopnea, palpitations, paroxysmal nocturnal dyspnea or shortness of breath Dermatological: negative for rash Respiratory: negative for cough or wheezing Urologic: negative for hematuria Abdominal: negative for nausea, vomiting, diarrhea, bright red blood per rectum, melena, or hematemesis Neurologic: negative for visual changes, syncope, or dizziness All other systems reviewed and are otherwise negative except as noted above.    Blood pressure 136/70, pulse 69, height 5\' 3"  (1.6 m), weight 114 lb (51.7  kg).  General appearance: alert and no distress Neck: no adenopathy, no carotid bruit, no JVD, supple, symmetrical, trachea midline and thyroid not enlarged, symmetric, no tenderness/mass/nodules Lungs: clear to auscultation bilaterally Heart: regular rate and rhythm, S1, S2 normal, no murmur, click, rub or gallop Extremities: extremities normal, atraumatic, no cyanosis or edema Pulses: 2+ and symmetric Skin: Skin color, texture, turgor normal. No rashes or lesions Neurologic: Alert and oriented X 3, normal strength and  tone. Normal symmetric reflexes. Normal coordination and gait  EKG sinus rhythm at 69 without ST or T-wave changes. There were inferior Q waves as well as poor R-wave progression. I personally reviewed this EKG.  ASSESSMENT AND PLAN:   Essential hypertension History of essential hypertension blood pressure 136/70. She is on Norvasc. Continue current meds at current dosing.  Mixed hyperlipidemia History of hyperlipidemia on statin therapy followed by her PCP      Lorretta Harp MD [facility], [facility] 03/12/2017 3:11 PM

## 2017-03-26 ENCOUNTER — Encounter: Payer: Self-pay | Admitting: Family Medicine

## 2017-03-26 ENCOUNTER — Ambulatory Visit (INDEPENDENT_AMBULATORY_CARE_PROVIDER_SITE_OTHER): Payer: Medicare Other | Admitting: Family Medicine

## 2017-03-26 VITALS — BP 130/66 | HR 77 | Temp 97.8°F | Ht 63.0 in | Wt 114.8 lb

## 2017-03-26 DIAGNOSIS — E039 Hypothyroidism, unspecified: Secondary | ICD-10-CM | POA: Diagnosis not present

## 2017-03-26 DIAGNOSIS — F419 Anxiety disorder, unspecified: Secondary | ICD-10-CM

## 2017-03-26 DIAGNOSIS — N183 Chronic kidney disease, stage 3 unspecified: Secondary | ICD-10-CM

## 2017-03-26 DIAGNOSIS — E785 Hyperlipidemia, unspecified: Secondary | ICD-10-CM | POA: Diagnosis not present

## 2017-03-26 MED ORDER — LORAZEPAM 0.5 MG PO TABS: 0.2500 mg | ORAL_TABLET | Freq: Every day | ORAL | 1 refills | Status: DC

## 2017-03-26 NOTE — Patient Instructions (Signed)
Great to see you!  Come back in 3 months 

## 2017-03-26 NOTE — Progress Notes (Signed)
   HPI  Patient presents today here for follow-up chronic medical conditions.  Anxiety Doing well with Cymbalta 30 mg +1/2 tablet of Ativan at night. No side effects.  Patient does complain of leg weakness which improves with exercise, she states that she has had decreased exercise recently due to some home improvements which caused her to need to stay home.  HLD Good med compliance and tolerance.   Hypothyroidism- good med compliance, denies symps.   PMH: Smoking status noted ROS: Per HPI  Objective: BP 130/66   Pulse 77   Temp 97.8 F (36.6 C) (Oral)   Ht '5\' 3"'$  (1.6 m)   Wt 114 lb 12.8 oz (52.1 kg)   BMI 20.34 kg/m  Gen: NAD, alert, cooperative with exam HEENT: NCAT CV: RRR, good S1/S2, no murmur Resp: CTABL, no wheezes, non-labored Ext: No edema, warm Neuro: Alert and oriented, No gross deficits  Assessment and plan:  #Anxiety Doing well with Cymbalta plus Ativan, refilled Ativan  #Hyperlipidemia Continue statin, checking labs today, nonfasting  #CKD Repeat labs, encourage fluids, avoid NSAIDs  #Hypothyroidism Asymptomatic Repeat labs Continue Synthroid 50 mcg   Orders Placed This Encounter  Procedures  . CBC with Differential/Platelet  . CMP14+EGFR  . Lipid panel  . TSH    Meds ordered this encounter  Medications  . LORazepam (ATIVAN) 0.5 MG tablet    Sig: Take 0.5 tablets (0.25 mg total) by mouth at bedtime.    Dispense:  30 tablet    Refill:  [place], MD [facility] 03/26/2017, 1:29 PM

## 2017-03-27 LAB — CBC WITH DIFFERENTIAL/PLATELET
Basophils Absolute: 0.1 10*3/uL (ref 0.0–0.2)
Basos: 1 %
EOS (ABSOLUTE): 0.2 10*3/uL (ref 0.0–0.4)
EOS: 2 %
HEMATOCRIT: 33.6 % — AB (ref 34.0–46.6)
Hemoglobin: 11.2 g/dL (ref 11.1–15.9)
Immature Grans (Abs): 0 10*3/uL (ref 0.0–0.1)
Immature Granulocytes: 0 %
LYMPHS ABS: 2.5 10*3/uL (ref 0.7–3.1)
Lymphs: 29 %
MCH: 28.8 pg (ref 26.6–33.0)
MCHC: 33.3 g/dL (ref 31.5–35.7)
MCV: 86 fL (ref 79–97)
MONOCYTES: 9 %
MONOS ABS: 0.8 10*3/uL (ref 0.1–0.9)
NEUTROS ABS: 5.1 10*3/uL (ref 1.4–7.0)
Neutrophils: 59 %
Platelets: 302 10*3/uL (ref 150–379)
RBC: 3.89 x10E6/uL (ref 3.77–5.28)
RDW: 15.9 % — AB (ref 12.3–15.4)
WBC: 8.6 10*3/uL (ref 3.4–10.8)

## 2017-03-27 LAB — LIPID PANEL
CHOLESTEROL TOTAL: 161 mg/dL (ref 100–199)
Chol/HDL Ratio: 2 ratio (ref 0.0–4.4)
HDL: 81 mg/dL (ref >39)
LDL Calculated: 66 mg/dL (ref 0–99)
TRIGLYCERIDES: 69 mg/dL (ref 0–149)
VLDL Cholesterol Cal: 14 mg/dL (ref 5–40)

## 2017-03-27 LAB — CMP14+EGFR
ALBUMIN: 4.2 g/dL (ref 3.2–4.6)
ALK PHOS: 85 IU/L (ref 39–117)
ALT: 11 IU/L (ref 0–32)
AST: 22 IU/L (ref 0–40)
Albumin/Globulin Ratio: 1.8 (ref 1.2–2.2)
BILIRUBIN TOTAL: 0.4 mg/dL (ref 0.0–1.2)
BUN/Creatinine Ratio: 15 (ref 12–28)
BUN: 22 mg/dL (ref 10–36)
CHLORIDE: 106 mmol/L (ref 96–106)
CO2: 24 mmol/L (ref 20–29)
Calcium: 9.6 mg/dL (ref 8.7–10.3)
Creatinine, Ser: 1.43 mg/dL — ABNORMAL HIGH (ref 0.57–1.00)
GFR calc Af Amer: 37 mL/min/{1.73_m2} — ABNORMAL LOW (ref >59)
GFR calc non Af Amer: 32 mL/min/{1.73_m2} — ABNORMAL LOW (ref >59)
Globulin, Total: 2.4 g/dL (ref 1.5–4.5)
Glucose: 90 mg/dL (ref 65–99)
POTASSIUM: 4.3 mmol/L (ref 3.5–5.2)
SODIUM: 145 mmol/L — AB (ref 134–144)
Total Protein: 6.6 g/dL (ref 6.0–8.5)

## 2017-03-27 LAB — TSH: TSH: 0.878 u[IU]/mL (ref 0.450–4.500)

## 2017-03-28 ENCOUNTER — Encounter: Payer: Self-pay | Admitting: *Deleted

## 2017-04-01 ENCOUNTER — Ambulatory Visit (INDEPENDENT_AMBULATORY_CARE_PROVIDER_SITE_OTHER): Payer: Medicare Other | Admitting: *Deleted

## 2017-04-01 DIAGNOSIS — Z0000 Encounter for general adult medical examination without abnormal findings: Secondary | ICD-10-CM

## 2017-04-01 NOTE — Patient Instructions (Signed)
Work on increasing water intake to 3-4 glasses per day.  Please bring a copy of your advanced directives to our office at your convenience (living will, healthcare power of attorney)  Thank you for coming in for your Annual Wellness Visit today!   Fall Prevention in the Home Falls can cause injuries. They can happen to people of all ages. There are many things you can do to make your home safe and to help prevent falls. What can I do on the outside of my home?  Regularly fix the edges of walkways and driveways and fix any cracks.  Remove anything that might make you trip as you walk through a door, such as a raised step or threshold.  Trim any bushes or trees on the path to your home.  Use bright outdoor lighting.  Clear any walking paths of anything that might make someone trip, such as rocks or tools.  Regularly check to see if handrails are loose or broken. Make sure that both sides of any steps have handrails.  Any raised decks and porches should have guardrails on the edges.  Have any leaves, snow, or ice cleared regularly.  Use sand or salt on walking paths during winter.  Clean up any spills in your garage right away. This includes oil or grease spills. What can I do in the bathroom?  Use night lights.  Install grab bars by the toilet and in the tub and shower. Do not use towel bars as grab bars.  Use non-skid mats or decals in the tub or shower.  If you need to sit down in the shower, use a plastic, non-slip stool.  Keep the floor dry. Clean up any water that spills on the floor as soon as it happens.  Remove soap buildup in the tub or shower regularly.  Attach bath mats securely with double-sided non-slip rug tape.  Do not have throw rugs and other things on the floor that can make you trip. What can I do in the bedroom?  Use night lights.  Make sure that you have a light by your bed that is easy to reach.  Do not use any sheets or blankets that are too  big for your bed. They should not hang down onto the floor.  Have a firm chair that has side arms. You can use this for support while you get dressed.  Do not have throw rugs and other things on the floor that can make you trip. What can I do in the kitchen?  Clean up any spills right away.  Avoid walking on wet floors.  Keep items that you use a lot in easy-to-reach places.  If you need to reach something above you, use a strong step stool that has a grab bar.  Keep electrical cords out of the way.  Do not use floor polish or wax that makes floors slippery. If you must use wax, use non-skid floor wax.  Do not have throw rugs and other things on the floor that can make you trip. What can I do with my stairs?  Do not leave any items on the stairs.  Make sure that there are handrails on both sides of the stairs and use them. Fix handrails that are broken or loose. Make sure that handrails are as long as the stairways.  Check any carpeting to make sure that it is firmly attached to the stairs. Fix any carpet that is loose or worn.  Avoid having throw rugs at  the top or bottom of the stairs. If you do have throw rugs, attach them to the floor with carpet tape.  Make sure that you have a light switch at the top of the stairs and the bottom of the stairs. If you do not have them, ask someone to add them for you. What else can I do to help prevent falls?  Wear shoes that: ? Do not have high heels. ? Have rubber bottoms. ? Are comfortable and fit you well. ? Are closed at the toe. Do not wear sandals.  If you use a stepladder: ? Make sure that it is fully opened. Do not climb a closed stepladder. ? Make sure that both sides of the stepladder are locked into place. ? Ask someone to hold it for you, if possible.  Clearly mark and make sure that you can see: ? Any grab bars or handrails. ? First and last steps. ? Where the edge of each step is.  Use tools that help you move  around (mobility aids) if they are needed. These include: ? Canes. ? Walkers. ? Scooters. ? Crutches.  Turn on the lights when you go into a dark area. Replace any light bulbs as soon as they burn out.  Set up your furniture so you have a clear path. Avoid moving your furniture around.  If any of your floors are uneven, fix them.  If there are any pets around you, be aware of where they are.  Review your medicines with your doctor. Some medicines can make you feel dizzy. This can increase your chance of falling. Ask your doctor what other things that you can do to help prevent falls. This information is not intended to replace advice given to you by your health care provider. Make sure you discuss any questions you have with your health care provider. Document Released: 12/02/2008 Document Revised: 07/14/2015 Document Reviewed: 03/12/2014 Elsevier Interactive Patient Education  [employer].

## 2017-04-01 NOTE — Progress Notes (Addendum)
Subjective:   Deborah Rivers is a 84 y.o. female who presents for an Initial Medicare Annual Wellness Visit.  Deborah Rivers worked at [employer] and [employer] before she retired.  She enjoys and walking when the weather permits.  She is widowed, and lives alone in [place].  She attends the [employer].  Deborah Rivers has 2 children, 4 grandchildren, and 6 great grandchildren.   Review of Systems    All negative today.         Objective:    Today's Vitals   04/01/17 1310  BP: 115/67  Pulse: 77  Weight: 115 lb (52.2 kg)  Height: 5\' 3"  (1.6 m)  PainSc: 0-No pain   Body mass index is 20.37 kg/m.  Advanced Directives 04/01/2017 01/16/2016 01/11/2016 11/20/2015 11/19/2015 06/16/2014  Does Patient Have a Medical Advance Directive? Yes Yes Yes Yes Yes No  Type of Paramedic of [place];Living will - [place];Living will [place];Living will Living will;Healthcare Power of Attorney -  Does patient want to make changes to medical advance directive? No - Patient declined - - No - Patient declined - -  Copy of [place] in Chart? No - copy requested - No - copy requested No - copy requested No - copy requested -  Would patient like information on creating a medical advance directive? - - No - Patient declined - - No - patient declined information    Current Medications (verified) Outpatient Encounter Medications as of 04/01/2017  Medication Sig  . acetaminophen (TYLENOL) 325 MG tablet Take 2 tablets (650 mg total) by mouth every 6 (six) hours as needed for mild pain (or Fever >/= 101).  Marland Kitchen amLODipine (NORVASC) 5 MG tablet Take 2.5 mg by mouth daily.   Marland Kitchen aspirin 81 MG tablet Take 81 mg by mouth daily.  Marland Kitchen atorvastatin (LIPITOR) 20 MG tablet Take 20 mg by mouth daily at 6 PM.   . DULoxetine (CYMBALTA) 30 MG capsule Take 30 mg daily by mouth.  . ergocalciferol (VITAMIN D2) 50000 UNITS capsule Take  50,000 Units by mouth once a week. Mon  . LORazepam (ATIVAN) 0.5 MG tablet Take 0.5 tablets (0.25 mg total) by mouth at bedtime.  . Multiple Vitamins-Minerals (CENTRUM SILVER PO) Take 1 tablet by mouth daily.  Marland Kitchen SYNTHROID 50 MCG tablet Take 1 tablet (50 mcg total) by mouth daily.   No facility-administered encounter medications on file as of 04/01/2017.     Allergies (verified) Sulfa antibiotics   History: Past Medical History:  Diagnosis Date  . Allergy   . Anemia   . Anxiety   . Cataract   . Depression   . Diverticulitis   . Essential hypertension   . GERD (gastroesophageal reflux disease)   . Hyperlipidemia   . Hypothyroidism   . Recurrent UTI    Past Surgical History:  Procedure Laterality Date  . APPENDECTOMY    . EYE SURGERY    . HERNIA REPAIR    . TOTAL VAGINAL HYSTERECTOMY     Family History  Problem Relation Age of Onset  . Stroke Mother   . Miscarriages / [place] Mother   . Hyperlipidemia Mother   . Hypertension Mother   . Heart attack Father   . Hyperlipidemia Father   . Hypertension Father   . Hearing loss Father   . Miscarriages / Stillbirths Sister   . Heart attack Maternal Grandmother   . Heart attack Maternal Grandfather   .  Heart attack Paternal Grandmother   . Heart attack Paternal Grandfather   . Diabetes Unknown   . Heart disease Unknown   . Kidney disease Unknown   . Heart attack Brother   . Cancer Sister    Social History   Socioeconomic History  . Marital status: Widowed    Spouse name: None  . Number of children: 2  . Years of education: None  . Highest education level: None  Social Needs  . Financial resource strain: None  . Food insecurity - worry: None  . Food insecurity - inability: None  . Transportation needs - medical: None  . Transportation needs - non-medical: None  Occupational History  . Occupation: Retried  Tobacco Use  . Smoking status: Never Smoker  . Smokeless tobacco: Never Used  Substance and Sexual  Activity  . Alcohol use: No    Alcohol/week: 0.0 oz  . Drug use: No  . Sexual activity: No  Other Topics Concern  . None  Social History Narrative  . None    Tobacco Counseling Counseling given: No   Clinical Intake:     Pain Score: 0-No pain                  Activities of Daily Living In your present state of health, do you have any difficulty performing the following activities: 04/01/2017  Hearing? Y  Comment right ear due to ear infections as a child  Vision? N  Difficulty concentrating or making decisions? N  Walking or climbing stairs? Y  Comment improved now, but had problems with leg weakness and did physical therapy  Dressing or bathing? N  Doing errands, shopping? N  Preparing Food and eating ? N  Using the Toilet? N  In the past six months, have you accidently leaked urine? N  Do you have problems with loss of bowel control? N  Managing your Medications? N  Managing your Finances? N  Housekeeping or managing your Housekeeping? N  Some recent data might be hidden     Immunizations and Health Maintenance Immunization History  Administered Date(s) Administered  . Influenza, High Dose Seasonal PF 12/10/2016   Health Maintenance Due  Topic Date Due  . TETANUS/TDAP  06/22/1943  . PNA vac Low Risk Adult (1 of 2 - PCV13) 06/23/1991     Patient Care Team: Timmothy Euler, MD as PCP - General (Family Medicine) Lorretta Harp, MD as PCP - Cardiology (Cardiology)       Assessment:   This is a routine wellness examination for Deborah Rivers.  Hearing/Vision screen No hearing deficit noted, however patient states she has a history of ear infections in her left ear as a child, and has decreased hearing in that ear.  She has been evaluated for a hearing aid in her left ear, but did not get one.  She does not wish to pursue getting a hearing aid at this time.   Patient is see yearly by Dr. Katy Fitch at [facility] for vision exams.     Dietary issues and exercise activities discussed: Current Exercise Habits: The patient does not participate in regular exercise at present, Exercise limited by: orthopedic condition(s)  Goals    . DIET - INCREASE WATER INTAKE     Increase water to 3-4 glasses per day     Patient states she is trying to get her weight back up after losing about 10 lbs recently.  She eats regular meals throughout the day and snacks as  needed.    Depression Screen PHQ 2/9 Scores 04/01/2017 03/26/2017 12/24/2016 12/03/2016  PHQ - 2 Score 1 0 0 0    Fall Risk Fall Risk  04/01/2017 04/01/2017 03/26/2017 12/24/2016 12/03/2016  Falls in the past year? - Yes Yes No No  Number falls in past yr: - 2 or more 2 or more - -  Injury with Fall? - No No - -  Risk for fall due to : History of fall(s);Impaired balance/gait;Impaired mobility History of fall(s);Impaired balance/gait - - -  Follow up Falls prevention discussed Education provided - - -    Is the patient's home free of loose throw rugs in walkways, pet beds, electrical cords, etc?   yes      Grab bars in the bathroom? no      Handrails on the stairs?   yes      Adequate lighting?   yes     Cognitive Function: MMSE - Mini Mental State Exam 04/01/2017  Orientation to time 5  Orientation to Place 4  Registration 3  Attention/ Calculation 5  Recall 3  Language- name 2 objects 2  Language- repeat 1  Language- follow 3 step command 3  Language- read & follow direction 1  Write a sentence 1  Copy design 1  Total score 29        Screening Tests Health Maintenance  Topic Date Due  . TETANUS/TDAP  06/22/1945  . PNA vac Low Risk Adult (1 of 2 - PCV13) 06/23/1991  . INFLUENZA VACCINE  Completed  . DEXA SCAN  Completed    Patient states she had tdap and pneumonia vaccines at Dr. Mart Piggs office.  Per Alyse Low at Dr. Murrell Redden office patient had tdap 02/20/2007, and no record of pneumonia vaccine at their office.  Recommend these at next visit with Dr.  Wendi Snipes.     Plan:     I have personally reviewed and noted the following in the patient's chart:   . Medical and social history . Use of alcohol, tobacco or illicit drugs  . Current medications and supplements . Functional ability and status . Nutritional status . Physical activity . Advanced directives . List of other physicians . Hospitalizations, surgeries, and ER visits in previous 12 months . Vitals . Screenings to include cognitive, depression, and falls . Referrals and appointments  In addition, I have reviewed and discussed with patient certain preventive protocols, quality metrics, and best practice recommendations. A written personalized care plan for preventive services as well as general preventive health recommendations were provided to patient.     Moosa Bueche M, RN   04/01/2017   I have reviewed and agree with the above AWV documentation.   Laroy Apple, MD [facility] 04/02/2017, 5:15 PM

## 2017-05-03 ENCOUNTER — Other Ambulatory Visit: Payer: Self-pay | Admitting: *Deleted

## 2017-05-03 MED ORDER — ATORVASTATIN CALCIUM 20 MG PO TABS: 20.0000 mg | ORAL_TABLET | Freq: Every day | ORAL | 1 refills | Status: DC

## 2017-06-21 ENCOUNTER — Encounter: Payer: Self-pay | Admitting: Family Medicine

## 2017-06-21 ENCOUNTER — Ambulatory Visit (INDEPENDENT_AMBULATORY_CARE_PROVIDER_SITE_OTHER): Payer: Medicare Other | Admitting: Family Medicine

## 2017-06-21 VITALS — BP 137/65 | HR 75 | Temp 97.0°F | Ht 63.0 in | Wt 117.8 lb

## 2017-06-21 DIAGNOSIS — M546 Pain in thoracic spine: Secondary | ICD-10-CM | POA: Diagnosis not present

## 2017-06-21 DIAGNOSIS — G8929 Other chronic pain: Secondary | ICD-10-CM | POA: Diagnosis not present

## 2017-06-21 NOTE — Progress Notes (Signed)
   HPI  Patient presents today here for back pain.  Patient reviewed an extensive history of back pain. She was initially injured after lifting a something at work about 30 years ago.  She was treated with chiropractic care for about 2 weeks and had complete resolution.  She continued to do well again until about 10 years ago until she fell causing a patellar fracture and recurrence of the pain.  Patient states that she improved after that and got completely better. She had recurrence of the pain after bending forward to clean up her front porch about 5 years ago and has had persistent almost daily pain since that time.  She describes midline thoracic pain as well as left-sided low back pain with intermittent sciatica.  She has good strength in the legs and does not have any limitations in activities  PMH: Smoking status noted ROS: Per HPI  Objective: BP 137/65   Pulse 75   Temp (!) 97 F (36.1 C) (Oral)   Ht 5\' 3"  (1.6 m)   Wt 117 lb 12.8 oz (53.4 kg)   BMI 20.87 kg/m  Gen: NAD, alert, cooperative with exam HEENT: NCAT, EOMI, PERRL CV: RRR, good S1/S2, no murmur Resp: CTABL, no wheezes, non-labored Ext: No edema, warm Neuro: Alert and oriented, No gross deficits  MRI from 06/10/2008 shows mild disc degeneration and small left disc protrusion at L3/L4, no spinal stenosis MR cervical spine from the same day shows multilevel spondylosis with no cord compression including a C3/C4 mild bulge  MRI from [facility] on/04/2014 shows chronic compression fracture of T6 and T7, multilevel disc degeneration with no herniations or bulges, and disc bulge at C6/C7 and C7/T1 with mild anterolisthesis  MRI from above was followed with C7/T1 injection    Assessment and plan:  #Chronic back pain Patient states that she has had this persistent pain now for 5 years Consider orthopedic referral, consider repea MRI Given age I do not believe she would be a good surgical  candidate However she may benefit from epidural injections if a specific nerve or focus can be identified. Continue Tylenol for now  Refer back to [facility] to consider injections again, did not have great benefit from previous injection- appreciate their eval.    Laroy Apple, MD [place] 06/21/2017, 5:12 PM

## 2017-06-24 ENCOUNTER — Telehealth: Payer: Self-pay | Admitting: Family Medicine

## 2017-06-25 ENCOUNTER — Encounter: Payer: Self-pay | Admitting: Family Medicine

## 2017-06-25 ENCOUNTER — Ambulatory Visit: Payer: Medicare Other | Admitting: Family Medicine

## 2017-06-25 ENCOUNTER — Ambulatory Visit (INDEPENDENT_AMBULATORY_CARE_PROVIDER_SITE_OTHER): Payer: Medicare Other | Admitting: Family Medicine

## 2017-06-25 VITALS — BP 117/68 | HR 86 | Temp 97.1°F | Ht 63.0 in | Wt 118.4 lb

## 2017-06-25 DIAGNOSIS — D692 Other nonthrombocytopenic purpura: Secondary | ICD-10-CM | POA: Diagnosis not present

## 2017-06-25 NOTE — Progress Notes (Signed)
   HPI  Patient presents today here for follow-up back pain as well as discuss bruising on the left leg.  Patient explains and recounts history of being injured in the left leg.  She states that she bruises very easily and that previously she had a very severe event, that was treated at physical therapy with a bubbling water bath which seemed to help quite a bit.  She requests referral back today.  Patient states that she has some red discoloration to the leg that she would like treated. She states that she bruises very easily on the legs and arms.  PMH: Smoking status noted ROS: Per HPI  Objective: BP 117/68   Pulse 86   Temp (!) 97.1 F (36.2 C) (Oral)   Ht 5\' 3"  (1.6 m)   Wt 118 lb 6.4 oz (53.7 kg)   BMI 20.97 kg/m  Gen: NAD, alert, cooperative with exam HEENT: NCAT Ext: No edema, warm Neuro: Alert and oriented, No gross deficits Skin: Left lower extremity with some hemosiderin staining, also approximately 3 cm circular ecchymosis consistent with senile purpura, also multiple areas of bruising on the arms consistent with senile purpura   Assessment and plan:  #Senile purpura Reassurance provided, discussed there is no need for physical therapy in this case. The story she is referring to is described as "the worst hematoma the doctor had ever seen" Return to clinic with any concerns   #Chronic back pain Reviewed our last visit, she will see orthopedics    Laroy Apple, MD [place] 06/25/2017, 4:59 PM

## 2017-07-11 ENCOUNTER — Other Ambulatory Visit: Payer: Self-pay | Admitting: Orthopedic Surgery

## 2017-07-11 ENCOUNTER — Other Ambulatory Visit (INDEPENDENT_AMBULATORY_CARE_PROVIDER_SITE_OTHER): Payer: Medicare Other

## 2017-07-11 DIAGNOSIS — M545 Low back pain: Secondary | ICD-10-CM | POA: Diagnosis not present

## 2017-07-19 ENCOUNTER — Other Ambulatory Visit: Payer: Self-pay | Admitting: Family Medicine

## 2017-07-25 ENCOUNTER — Other Ambulatory Visit: Payer: Self-pay | Admitting: *Deleted

## 2017-07-25 MED ORDER — AMLODIPINE BESYLATE 5 MG PO TABS: 2.5000 mg | ORAL_TABLET | Freq: Every day | ORAL | 0 refills | Status: DC

## 2017-07-30 ENCOUNTER — Ambulatory Visit: Payer: Medicare Other | Admitting: Family Medicine

## 2017-08-02 ENCOUNTER — Ambulatory Visit (INDEPENDENT_AMBULATORY_CARE_PROVIDER_SITE_OTHER): Payer: Medicare Other | Admitting: Family Medicine

## 2017-08-02 ENCOUNTER — Encounter: Payer: Self-pay | Admitting: Family Medicine

## 2017-08-02 VITALS — BP 121/57 | HR 77 | Temp 97.1°F | Ht 63.0 in | Wt 118.0 lb

## 2017-08-02 DIAGNOSIS — E039 Hypothyroidism, unspecified: Secondary | ICD-10-CM | POA: Diagnosis not present

## 2017-08-02 DIAGNOSIS — E785 Hyperlipidemia, unspecified: Secondary | ICD-10-CM

## 2017-08-02 DIAGNOSIS — R0982 Postnasal drip: Secondary | ICD-10-CM

## 2017-08-02 MED ORDER — FLUTICASONE PROPIONATE 50 MCG/ACT NA SUSP: 2.0000 | Freq: Every day | NASAL | 6 refills | Status: AC

## 2017-08-02 MED ORDER — LORAZEPAM 0.5 MG PO TABS: ORAL_TABLET | ORAL | 1 refills | Status: DC

## 2017-08-02 NOTE — Patient Instructions (Addendum)
Great to see you!  Come back to see Dr. Evette Doffing or Dr. Warrick Parisian in 3 months

## 2017-08-02 NOTE — Progress Notes (Signed)
   HPI  Patient presents today here for congestion and follow-up.  Postnasal drip Patient with postnasal drip and frequent throat clearing now for about 2 weeks.  She denies fever, chills, sweats. She is breathing okay. She was started on prednisone by her orthopedic surgeon for back pain this morning.  Upper lipidemia Good med compliance, watching diet moderately.  Hypothyroidism Good med compliance Asymptomatic.  PMH: Smoking status noted ROS: Per HPI  Objective: BP (!) 121/57 (BP Location: Left Arm)   Pulse 77   Temp (!) 97.1 F (36.2 C) (Oral)   Ht _0  (1.6 m)   Wt 118 lb (53.5 kg)   BMI 20.90 kg/m  Gen: NAD, alert, cooperative with exam HEENT: NCAT tenderness to palpation of left frontal sinus, oropharynx moist and clear, nares with boggy and swollen mucosa CV: RRR, good S1/S2, no murmur Resp: CTABL, no wheezes, non-labored Ext: No edema, warm Neuro: Alert and oriented, No gross deficits  Assessment and plan:  #Postnasal drip Possible smoldering sinusitis, however she started prednisone would likely have good relief from this alone, we discussed the possibility of using antibiotics, she will wait 1 week to see if she has improvement with the medication. Start Flonase  #Hypothyroidism Asymptomatic, continue Synthroid Labs  #Hyperlipidemia Doing well with statin, repeat labs     Orders Placed This Encounter  Procedures  . Lipid panel  . CMP14+EGFR  . CBC with Differential/Platelet  . TSH    Meds ordered this encounter  Medications  . fluticasone (FLONASE) 50 MCG/ACT nasal spray    Sig: Place 2 sprays into both nostrils daily.    Dispense:  16 g    Refill:  De [place]ame[place], MD [facility] 08/02/2017, 11:19 AM

## 2017-08-03 LAB — CBC WITH DIFFERENTIAL/PLATELET
BASOS: 0 %
Basophils Absolute: 0 10*3/uL (ref 0.0–0.2)
EOS (ABSOLUTE): 0.2 10*3/uL (ref 0.0–0.4)
EOS: 2 %
HEMATOCRIT: 32.1 % — AB (ref 34.0–46.6)
HEMOGLOBIN: 10.3 g/dL — AB (ref 11.1–15.9)
IMMATURE GRANS (ABS): 0 10*3/uL (ref 0.0–0.1)
IMMATURE GRANULOCYTES: 0 %
LYMPHS: 17 %
Lymphocytes Absolute: 1.5 10*3/uL (ref 0.7–3.1)
MCH: 28.4 pg (ref 26.6–33.0)
MCHC: 32.1 g/dL (ref 31.5–35.7)
MCV: 88 fL (ref 79–97)
Monocytes Absolute: 0.9 10*3/uL (ref 0.1–0.9)
Monocytes: 10 %
NEUTROS PCT: 71 %
Neutrophils Absolute: 6.2 10*3/uL (ref 1.4–7.0)
Platelets: 245 10*3/uL (ref 150–450)
RBC: 3.63 x10E6/uL — ABNORMAL LOW (ref 3.77–5.28)
RDW: 16.4 % — ABNORMAL HIGH (ref 12.3–15.4)
WBC: 8.9 10*3/uL (ref 3.4–10.8)

## 2017-08-03 LAB — CMP14+EGFR
ALBUMIN: 3.9 g/dL (ref 3.2–4.6)
ALT: 10 IU/L (ref 0–32)
AST: 19 IU/L (ref 0–40)
Albumin/Globulin Ratio: 2 (ref 1.2–2.2)
Alkaline Phosphatase: 75 IU/L (ref 39–117)
BUN/Creatinine Ratio: 18 (ref 12–28)
BUN: 23 mg/dL (ref 10–36)
Bilirubin Total: 0.3 mg/dL (ref 0.0–1.2)
CALCIUM: 8.8 mg/dL (ref 8.7–10.3)
CO2: 25 mmol/L (ref 20–29)
CREATININE: 1.31 mg/dL — AB (ref 0.57–1.00)
Chloride: 105 mmol/L (ref 96–106)
GFR calc Af Amer: 41 mL/min/{1.73_m2} — ABNORMAL LOW (ref >59)
GFR, EST NON AFRICAN AMERICAN: 36 mL/min/{1.73_m2} — AB (ref >59)
GLOBULIN, TOTAL: 2 g/dL (ref 1.5–4.5)
Glucose: 99 mg/dL (ref 65–99)
Potassium: 3.9 mmol/L (ref 3.5–5.2)
Sodium: 144 mmol/L (ref 134–144)
Total Protein: 5.9 g/dL — ABNORMAL LOW (ref 6.0–8.5)

## 2017-08-03 LAB — LIPID PANEL
CHOLESTEROL TOTAL: 173 mg/dL (ref 100–199)
Chol/HDL Ratio: 2.3 ratio (ref 0.0–4.4)
HDL: 75 mg/dL (ref >39)
LDL Calculated: 82 mg/dL (ref 0–99)
Triglycerides: 82 mg/dL (ref 0–149)
VLDL Cholesterol Cal: 16 mg/dL (ref 5–40)

## 2017-08-03 LAB — TSH: TSH: 0.717 u[IU]/mL (ref 0.450–4.500)

## 2017-08-09 ENCOUNTER — Other Ambulatory Visit (HOSPITAL_COMMUNITY): Payer: Self-pay | Admitting: Orthopedic Surgery

## 2017-08-09 DIAGNOSIS — M545 Low back pain: Secondary | ICD-10-CM

## 2017-08-10 ENCOUNTER — Encounter: Payer: Self-pay | Admitting: Physician Assistant

## 2017-08-10 ENCOUNTER — Ambulatory Visit (INDEPENDENT_AMBULATORY_CARE_PROVIDER_SITE_OTHER): Payer: Medicare Other | Admitting: Physician Assistant

## 2017-08-10 VITALS — BP 110/59 | HR 83 | Temp 97.0°F | Ht 63.0 in | Wt 113.0 lb

## 2017-08-10 DIAGNOSIS — R300 Dysuria: Secondary | ICD-10-CM | POA: Diagnosis not present

## 2017-08-10 DIAGNOSIS — R5383 Other fatigue: Secondary | ICD-10-CM | POA: Diagnosis not present

## 2017-08-10 DIAGNOSIS — R252 Cramp and spasm: Secondary | ICD-10-CM | POA: Insufficient documentation

## 2017-08-10 MED ORDER — CIPROFLOXACIN HCL 500 MG PO TABS: 500.0000 mg | ORAL_TABLET | Freq: Two times a day (BID) | ORAL | 0 refills | Status: DC

## 2017-08-11 LAB — CMP14+EGFR
A/G RATIO: 2.1 (ref 1.2–2.2)
ALK PHOS: 71 IU/L (ref 39–117)
ALT: 14 IU/L (ref 0–32)
AST: 16 IU/L (ref 0–40)
Albumin: 4.1 g/dL (ref 3.2–4.6)
BILIRUBIN TOTAL: 0.8 mg/dL (ref 0.0–1.2)
BUN/Creatinine Ratio: 20 (ref 12–28)
BUN: 35 mg/dL (ref 10–36)
CALCIUM: 9.4 mg/dL (ref 8.7–10.3)
CHLORIDE: 101 mmol/L (ref 96–106)
CO2: 25 mmol/L (ref 20–29)
Creatinine, Ser: 1.77 mg/dL — ABNORMAL HIGH (ref 0.57–1.00)
GFR calc Af Amer: 29 mL/min/{1.73_m2} — ABNORMAL LOW (ref >59)
GFR calc non Af Amer: 25 mL/min/{1.73_m2} — ABNORMAL LOW (ref >59)
GLUCOSE: 102 mg/dL — AB (ref 65–99)
Globulin, Total: 2 g/dL (ref 1.5–4.5)
POTASSIUM: 3.7 mmol/L (ref 3.5–5.2)
SODIUM: 142 mmol/L (ref 134–144)
Total Protein: 6.1 g/dL (ref 6.0–8.5)

## 2017-08-11 LAB — CBC WITH DIFFERENTIAL/PLATELET
BASOS ABS: 0 10*3/uL (ref 0.0–0.2)
Basos: 0 %
EOS (ABSOLUTE): 0.2 10*3/uL (ref 0.0–0.4)
EOS: 1 %
Hematocrit: 38.8 % (ref 34.0–46.6)
Hemoglobin: 12.6 g/dL (ref 11.1–15.9)
IMMATURE GRANULOCYTES: 1 %
Immature Grans (Abs): 0.1 10*3/uL (ref 0.0–0.1)
LYMPHS ABS: 2.2 10*3/uL (ref 0.7–3.1)
Lymphs: 19 %
MCH: 28.7 pg (ref 26.6–33.0)
MCHC: 32.5 g/dL (ref 31.5–35.7)
MCV: 88 fL (ref 79–97)
MONOS ABS: 1.2 10*3/uL — AB (ref 0.1–0.9)
Monocytes: 11 %
NEUTROS PCT: 68 %
Neutrophils Absolute: 7.9 10*3/uL — ABNORMAL HIGH (ref 1.4–7.0)
Platelets: 380 10*3/uL (ref 150–450)
RBC: 4.39 x10E6/uL (ref 3.77–5.28)
RDW: 16.7 % — AB (ref 12.3–15.4)
WBC: 11.4 10*3/uL — AB (ref 3.4–10.8)

## 2017-08-11 LAB — IRON: IRON: 108 ug/dL (ref 27–139)

## 2017-08-12 LAB — URINALYSIS
Bilirubin, UA: NEGATIVE
Nitrite, UA: NEGATIVE
PH UA: 5.5 (ref 5.0–7.5)
RBC, UA: NEGATIVE
Specific Gravity, UA: 1.025 (ref 1.005–1.030)
Urobilinogen, Ur: 1 mg/dL (ref 0.2–1.0)

## 2017-08-12 NOTE — Progress Notes (Signed)
BP (!) 110/59   Pulse 83   Temp (!) 97 F (36.1 C) (Oral)   Ht 5' 3" (1.6 m)   Wt 113 lb (51.3 kg)   BMI 20.02 kg/m    Subjective:    Patient ID: Deborah Rivers, female    DOB: 1924-03-24, 84 y.o.   MRN: [id number]  HPI: Deborah Rivers is a 84 y.o. female presenting on 08/10/2017 for No chief complaint on file.  This patient comes in today with problems and is quite long-winded about many things.  She states she has had cramps in her legs for about 1 week.  It mainly happens in her left lower leg.  She denies any changes in her activity. She has had multiple complaints of back pain and joint pain. Her meds and labs are reviewed. She did have a low HGB recently and we will get a B12 level.  This patient has had several days of dysuria, frequency and nocturia. There is also pain over the bladder in the suprapubic region, no back pain. Denies leakage or hematuria.  Denies fever or chills. No pain in flank area.  Past Medical History:  Diagnosis Date  . Allergy   . Anemia   . Anxiety   . Cataract   . Depression   . Diverticulitis   . Essential hypertension   . GERD (gastroesophageal reflux disease)   . Hyperlipidemia   . Hypothyroidism   . Recurrent UTI    Relevant past medical, surgical, family and social history reviewed and updated as indicated. Interim medical history since our last visit reviewed. Allergies and medications reviewed and updated. DATA REVIEWED: CHART IN EPIC  Family History reviewed for pertinent findings.  Review of Systems  Constitutional: Positive for fatigue. Negative for activity change and fever.  HENT: Negative.   Eyes: Negative.   Respiratory: Negative.  Negative for cough.   Cardiovascular: Negative.  Negative for chest pain.  Gastrointestinal: Negative.  Negative for abdominal pain.  Endocrine: Negative.   Genitourinary: Negative.  Negative for dysuria.  Musculoskeletal: Positive for arthralgias, back pain and myalgias. Negative for  gait problem, joint swelling, neck pain and neck stiffness.  Skin: Negative.   Neurological: Negative.  Negative for dizziness, light-headedness and numbness.    Allergies as of 08/10/2017      Reactions   Sulfa Antibiotics Nausea Only, Anxiety, Rash      Medication List        Accurate as of 08/10/17 11:59 PM. Always use your most recent med list.          acetaminophen 325 MG tablet Commonly known as:  TYLENOL Take 2 tablets (650 mg total) by mouth every 6 (six) hours as needed for mild pain (or Fever >/= 101).   amLODipine 5 MG tablet Commonly known as:  NORVASC Take 0.5 tablets (2.5 mg total) by mouth daily.   aspirin 81 MG tablet Take 81 mg by mouth daily.   atorvastatin 20 MG tablet Commonly known as:  LIPITOR Take 1 tablet (20 mg total) by mouth daily at 6 PM.   CENTRUM SILVER PO Take 1 tablet by mouth daily.   ciprofloxacin 500 MG tablet Commonly known as:  CIPRO Take 1 tablet (500 mg total) by mouth 2 (two) times daily.   DULoxetine 30 MG capsule Commonly known as:  CYMBALTA Take 30 mg daily by mouth.   ergocalciferol 50000 units capsule Commonly known as:  VITAMIN D2 Take 50,000 Units by mouth once a  week. Mon   fluticasone 50 MCG/ACT nasal spray Commonly known as:  FLONASE Place 2 sprays into both nostrils daily.   LORazepam 0.5 MG tablet Commonly known as:  ATIVAN TAKE 1/2 (ONE-HALF) TABLET BY MOUTH AT BEDTIME   SYNTHROID 50 MCG tablet Generic drug:  levothyroxine Take 1 tablet (50 mcg total) by mouth daily.          Objective:    BP (!) 110/59   Pulse 83   Temp (!) 97 F (36.1 C) (Oral)   Ht 5' 3" (1.6 m)   Wt 113 lb (51.3 kg)   BMI 20.02 kg/m   Allergies  Allergen Reactions  . Sulfa Antibiotics Nausea Only, Anxiety and Rash    Wt Readings from Last 3 Encounters:  08/10/17 113 lb (51.3 kg)  08/02/17 118 lb (53.5 kg)  06/25/17 118 lb 6.4 oz (53.7 kg)    Physical Exam  Constitutional: She is oriented to person, place, and  time. She appears well-developed and well-nourished.  HENT:  Head: Normocephalic and atraumatic.  Right Ear: Tympanic membrane, external ear and ear canal normal.  Left Ear: Tympanic membrane, external ear and ear canal normal.  Nose: Nose normal. No rhinorrhea.  Mouth/Throat: Oropharynx is clear and moist and mucous membranes are normal. No oropharyngeal exudate or posterior oropharyngeal erythema.  Eyes: Pupils are equal, round, and reactive to light. Conjunctivae and EOM are normal.  Neck: Normal range of motion. Neck supple.  Cardiovascular: Normal rate, regular rhythm, normal heart sounds and intact distal pulses.  Pulmonary/Chest: Effort normal and breath sounds normal.  Abdominal: Soft. Bowel sounds are normal.  Neurological: She is alert and oriented to person, place, and time. She has normal reflexes.  Skin: Skin is warm and dry. No rash noted.  Psychiatric: She has a normal mood and affect. Her behavior is normal. Judgment and thought content normal.    Results for orders placed or performed in visit on 08/10/17  CBC with Differential/Platelet  Result Value Ref Range   WBC 11.4 (H) 3.4 - 10.8 x10E3/uL   RBC 4.39 3.77 - 5.28 x10E6/uL   Hemoglobin 12.6 11.1 - 15.9 g/dL   Hematocrit 38.8 34.0 - 46.6 %   MCV 88 79 - 97 fL   MCH 28.7 26.6 - 33.0 pg   MCHC 32.5 31.5 - 35.7 g/dL   RDW 16.7 (H) 12.3 - 15.4 %   Platelets 380 150 - 450 x10E3/uL   Neutrophils 68 Not Estab. %   Lymphs 19 Not Estab. %   Monocytes 11 Not Estab. %   Eos 1 Not Estab. %   Basos 0 Not Estab. %   Neutrophils Absolute 7.9 (H) 1.4 - 7.0 x10E3/uL   Lymphocytes Absolute 2.2 0.7 - 3.1 x10E3/uL   Monocytes Absolute 1.2 (H) 0.1 - 0.9 x10E3/uL   EOS (ABSOLUTE) 0.2 0.0 - 0.4 x10E3/uL   Basophils Absolute 0.0 0.0 - 0.2 x10E3/uL   Immature Granulocytes 1 Not Estab. %   Immature Grans (Abs) 0.1 0.0 - 0.1 x10E3/uL  CMP14+EGFR  Result Value Ref Range   Glucose 102 (H) 65 - 99 mg/dL   BUN 35 10 - 36 mg/dL    Creatinine, Ser 1.77 (H) 0.57 - 1.00 mg/dL   GFR calc non Af Amer 25 (L) >59 mL/min/1.73   GFR calc Af Amer 29 (L) >59 mL/min/1.73   BUN/Creatinine Ratio 20 12 - 28   Sodium 142 134 - 144 mmol/L   Potassium 3.7 3.5 - 5.2 mmol/L   Chloride  101 96 - 106 mmol/L   CO2 25 20 - 29 mmol/L   Calcium 9.4 8.7 - 10.3 mg/dL   Total Protein 6.1 6.0 - 8.5 g/dL   Albumin 4.1 3.2 - 4.6 g/dL   Globulin, Total 2.0 1.5 - 4.5 g/dL   Albumin/Globulin Ratio 2.1 1.2 - 2.2   Bilirubin Total 0.8 0.0 - 1.2 mg/dL   Alkaline Phosphatase 71 39 - 117 IU/L   AST 16 0 - 40 IU/L   ALT 14 0 - 32 IU/L  Iron  Result Value Ref Range   Iron 108 27 - 139 ug/dL  Urinalysis  Result Value Ref Range   Specific Gravity, UA 1.025 1.005 - 1.030   pH, UA 5.5 5.0 - 7.5   Color, UA Yellow Yellow   Appearance Ur Clear Clear   Leukocytes, UA Trace (A) Negative   Protein, UA 2+ (A) Negative/Trace   Glucose, UA Trace (A) Negative   Ketones, UA 1+ (A) Negative   RBC, UA Negative Negative   Bilirubin, UA Negative Negative   Urobilinogen, Ur 1.0 0.2 - 1.0 mg/dL   Nitrite, UA Negative Negative      Assessment & Plan:   1. Leg cramp - CBC with Differential/Platelet - CMP14+EGFR - Iron  2. Fatigue, unspecified type - CBC with Differential/Platelet - CMP14+EGFR - Iron  3. Dysuria - Urinalysis Start Ciprofloxacin   Continue all other maintenance medications as listed above.  Follow up plan: No follow-ups on file.  Educational handout given for [place] PA-C [place] [address]  [place], [place] [address] [phone]   08/12/2017, 11:11 AM

## 2017-08-15 ENCOUNTER — Encounter: Payer: Self-pay | Admitting: *Deleted

## 2017-08-16 ENCOUNTER — Encounter (HOSPITAL_COMMUNITY)
Admission: RE | Admit: 2017-08-16 | Discharge: 2017-08-16 | Disposition: A | Discharge status: Home / Self Care | Payer: Medicare Other | Source: Ambulatory Visit | Attending: Orthopedic Surgery | Admitting: Orthopedic Surgery

## 2017-08-16 DIAGNOSIS — M545 Low back pain: Secondary | ICD-10-CM

## 2017-08-16 DIAGNOSIS — M5430 Sciatica, unspecified side: Secondary | ICD-10-CM | POA: Diagnosis not present

## 2017-08-16 MED ORDER — TECHNETIUM TC 99M MEDRONATE IV KIT
21.7000 | PACK | Freq: Once | INTRAVENOUS | Status: AC | PRN
  Administered 2017-08-16: 21.7 via INTRAVENOUS

## 2017-09-09 ENCOUNTER — Telehealth: Payer: Self-pay | Admitting: Family Medicine

## 2017-09-09 NOTE — Telephone Encounter (Signed)
The patient "is requesting something for her nerves now".  The patient is already being treated with Cymbalta plus Ativan.  She will need to follow-up for additional medications  Laroy Apple, MD [facility] 09/09/2017, 5:18 PM

## 2017-09-10 NOTE — Telephone Encounter (Signed)
Patient aware.  Appointment made for this Thursday 09/12/17.

## 2017-09-12 ENCOUNTER — Encounter: Payer: Self-pay | Admitting: Family Medicine

## 2017-09-12 ENCOUNTER — Ambulatory Visit (INDEPENDENT_AMBULATORY_CARE_PROVIDER_SITE_OTHER): Payer: Medicare Other | Admitting: Family Medicine

## 2017-09-12 VITALS — BP 133/89 | HR 78 | Temp 98.6°F | Ht 63.0 in | Wt 116.0 lb

## 2017-09-12 DIAGNOSIS — R630 Anorexia: Secondary | ICD-10-CM | POA: Diagnosis not present

## 2017-09-12 DIAGNOSIS — R6889 Other general symptoms and signs: Secondary | ICD-10-CM | POA: Diagnosis not present

## 2017-09-12 DIAGNOSIS — F419 Anxiety disorder, unspecified: Secondary | ICD-10-CM | POA: Diagnosis not present

## 2017-09-12 MED ORDER — MIRTAZAPINE 7.5 MG PO TABS: 7.5000 mg | ORAL_TABLET | Freq: Every day | ORAL | 2 refills | Status: DC

## 2017-09-12 NOTE — Progress Notes (Signed)
   HPI  Patient presents today here for anxiety and decreased appetite.  Patient states "she knows exactly was wrong with her" she has decreased appetite and anxiety due to that.  Patient is tolerating Ativan at night very well and states that she sleeps very good. Cymbalta is well-tolerated and has made an improvement.  Patient states that she is not depressed. She is very worried about her appetite.  She tells several stories from earlier in life about low weight and decreased appetite.  PMH: Smoking status noted ROS: Per HPI  Objective: BP 133/89   Pulse 78   Temp 98.6 F (37 C) (Oral)   Ht 5\' 3"  (1.6 m)   Wt 116 lb (52.6 kg)   BMI 20.55 kg/m  Gen: NAD, alert, cooperative with exam HEENT: NCAT CV: RRR, good S1/S2, no murmur Resp: CTABL, no wheezes, non-labored Ext: No edema, warm Neuro: Alert and oriented, No gross deficits Psych: Nervous affect  Assessment and plan:  #Anxiety Patient with primarily anxiety disorder, continue Cymbalta plus Ativan, adding Remeron at night to help with appetite, sleep, and hopefully anxiety Follow-up 1 month   Pt requests B12 injections so we are checking levels today, no Anemia or macrocytosis.    Orders Placed This Encounter  Procedures  . Vitamin B12  . Folate    Meds ordered this encounter  Medications  . mirtazapine (REMERON) 7.5 MG tablet    Sig: Take 1 tablet (7.5 mg total) by mouth at bedtime.    Dispense:  30 tablet    Refill:  [place], MD [facility] 09/12/2017, 1:25 PM

## 2017-09-12 NOTE — Patient Instructions (Signed)
Great to see you!  Continue the ativan at night and cymbalta in the morning  I Have added mirtazapine which will help your appetite, sleep, and likely your anxiety  Come back in 1 month

## 2017-09-13 LAB — VITAMIN B12: Vitamin B-12: 898 pg/mL (ref 232–1245)

## 2017-09-13 LAB — FOLATE: Folate: 14.3 ng/mL (ref >3.0)

## 2017-09-17 DIAGNOSIS — M5134 Other intervertebral disc degeneration, thoracic region: Secondary | ICD-10-CM | POA: Diagnosis not present

## 2017-09-26 DIAGNOSIS — M5134 Other intervertebral disc degeneration, thoracic region: Secondary | ICD-10-CM | POA: Diagnosis not present

## 2017-10-14 ENCOUNTER — Telehealth: Payer: Self-pay | Admitting: Family Medicine

## 2017-10-14 NOTE — Telephone Encounter (Signed)
What symptoms do you have? Foot and ankle swelling  How long have you been sick? Started yesterday  Have you been seen for this problem? no  If your provider decides to give you a prescription, which pharmacy would you like for it to be sent to? [email or web address]   Patient informed that this information will be sent to the clinical staff for review and that they should receive a follow up call.

## 2017-10-15 ENCOUNTER — Ambulatory Visit (INDEPENDENT_AMBULATORY_CARE_PROVIDER_SITE_OTHER): Payer: Medicare Other | Admitting: Family Medicine

## 2017-10-15 ENCOUNTER — Encounter: Payer: Self-pay | Admitting: Family Medicine

## 2017-10-15 VITALS — BP 141/74 | HR 81 | Temp 97.5°F | Ht 63.0 in | Wt 116.0 lb

## 2017-10-15 DIAGNOSIS — R600 Localized edema: Secondary | ICD-10-CM

## 2017-10-15 DIAGNOSIS — R8271 Bacteriuria: Secondary | ICD-10-CM | POA: Diagnosis not present

## 2017-10-15 LAB — URINALYSIS, ROUTINE W REFLEX MICROSCOPIC
Bilirubin, UA: NEGATIVE
GLUCOSE, UA: NEGATIVE
Nitrite, UA: NEGATIVE
Specific Gravity, UA: 1.02 (ref 1.005–1.030)
Urobilinogen, Ur: 0.2 mg/dL (ref 0.2–1.0)
pH, UA: 5.5 (ref 5.0–7.5)

## 2017-10-15 LAB — MICROSCOPIC EXAMINATION: WBC, UA: >30 /hpf — AB (ref 0–5)

## 2017-10-15 NOTE — Patient Instructions (Signed)
Edema Edema is an abnormal buildup of fluids in your bodytissues. Edema is somewhatdependent on gravity to pull the fluid to the lowest place in your body. That makes the condition more common in the legs and thighs (lower extremities). Painless swelling of the feet and ankles is common and becomes more likely as you get older. It is also common in looser tissues, like around your eyes. When the affected area is squeezed, the fluid may move out of that spot and leave a dent for a few moments. This dent is called pitting. What are the causes? There are many possible causes of edema. Eating too much salt and being on your feet or sitting for a long time can cause edema in your legs and ankles. Hot weather may make edema worse. Common medical causes of edema include:  Heart failure.  Liver disease.  Kidney disease.  Weak blood vessels in your legs.  Cancer.  An injury.  Pregnancy.  Some medications.  Obesity.  What are the signs or symptoms? Edema is usually painless.Your skin may look swollen or shiny. How is this diagnosed? Your health care provider may be able to diagnose edema by asking about your medical history and doing a physical exam. You may need to have tests such as X-rays, an electrocardiogram, or blood tests to check for medical conditions that may cause edema. How is this treated? Edema treatment depends on the cause. If you have heart, liver, or kidney disease, you need the treatment appropriate for these conditions. General treatment may include:  Elevation of the affected body part above the level of your heart.  Compression of the affected body part. Pressure from elastic bandages or support stockings squeezes the tissues and forces fluid back into the blood vessels. This keeps fluid from entering the tissues.  Restriction of fluid and salt intake.  Use of a water pill (diuretic). These medications are appropriate only for some types of edema. They pull fluid  out of your body and make you urinate more often. This gets rid of fluid and reduces swelling, but diuretics can have side effects. Only use diuretics as directed by your health care provider.  Follow these instructions at home:  Keep the affected body part above the level of your heart when you are lying down.  Do not sit still or stand for prolonged periods.  Do not put anything directly under your knees when lying down.  Do not wear constricting clothing or garters on your upper legs.  Exercise your legs to work the fluid back into your blood vessels. This may help the swelling go down.  Wear elastic bandages or support stockings to reduce ankle swelling as directed by your health care provider.  Eat a low-salt diet to reduce fluid if your health care provider recommends it.  Only take medicines as directed by your health care provider. Contact a health care provider if:  Your edema is not responding to treatment.  You have heart, liver, or kidney disease and notice symptoms of edema.  You have edema in your legs that does not improve after elevating them.  You have sudden and unexplained weight gain. Get help right away if:  You develop shortness of breath or chest pain.  You cannot breathe when you lie down.  You develop pain, redness, or warmth in the swollen areas.  You have heart, liver, or kidney disease and suddenly get edema.  You have a fever and your symptoms suddenly get worse. This information is   not intended to replace advice given to you by your health care provider. Make sure you discuss any questions you have with your health care provider. Document Released: 02/05/2005 Document Revised: 07/14/2015 Document Reviewed: 11/28/2012 Elsevier Interactive Patient Education  2017 Elsevier Inc.  

## 2017-10-15 NOTE — Telephone Encounter (Signed)
appt scheduled Pt notified 

## 2017-10-15 NOTE — Addendum Note (Signed)
Addended by: Ilean China on: 10/15/2017 05:18 PM   Modules accepted: Orders

## 2017-10-15 NOTE — Addendum Note (Signed)
Addended by: Ilean China on: 10/15/2017 04:42 PM   Modules accepted: Orders

## 2017-10-15 NOTE — Progress Notes (Signed)
Subjective: CC: Edema PCP: Dettinger, Fransisca Kaufmann, MD Deborah Rivers is a 84 y.o. female presenting to clinic today for:  1. Edema Patient reports a long-standing history of bilateral lower extremity edema.  She notes over the weekend it seemed to be a little bit worse than normal.  She notes that her left lower extremity is always more swollen than her right.  She states that the edema seems to be more prominent after being in the seated position for a prolonged amount of time.  She notes some soreness anteriorly of the shins but otherwise has no problems with pain.  She has chronic low back pain for which she is seeing orthopedist and undergoing low back injections.  No sensation changes.  She does have slight skin discoloration anteriorly.  She does not use compression hose.  She does not elevate lower extremity's.  Denies any shortness of breath orthopnea.  No use of NSAID medications.  She is unable to quantify how much salt she eats.   ROS: Per HPI  Allergies  Allergen Reactions  . Sulfa Antibiotics Nausea Only, Anxiety and Rash   Past Medical History:  Diagnosis Date  . Allergy   . Anemia   . Anxiety   . Cataract   . Depression   . Diverticulitis   . Essential hypertension   . GERD (gastroesophageal reflux disease)   . Hyperlipidemia   . Hypothyroidism   . Recurrent UTI     Current Outpatient Medications:  .  acetaminophen (TYLENOL) 325 MG tablet, Take 2 tablets (650 mg total) by mouth every 6 (six) hours as needed for mild pain (or Fever >/= 101)., Disp: , Rfl:  .  amLODipine (NORVASC) 5 MG tablet, Take 0.5 tablets (2.5 mg total) by mouth daily., Disp: 90 tablet, Rfl: 0 .  aspirin 81 MG tablet, Take 81 mg by mouth daily., Disp: , Rfl:  .  atorvastatin (LIPITOR) 20 MG tablet, Take 1 tablet (20 mg total) by mouth daily at 6 PM., Disp: 90 tablet, Rfl: 1 .  DULoxetine (CYMBALTA) 30 MG capsule, Take 30 mg daily by mouth., Disp: , Rfl:  .  ergocalciferol (VITAMIN D2)  50000 UNITS capsule, Take 50,000 Units by mouth once a week. Mon, Disp: , Rfl:  .  fluticasone (FLONASE) 50 MCG/ACT nasal spray, Place 2 sprays into both nostrils daily., Disp: 16 g, Rfl: 6 .  LORazepam (ATIVAN) 0.5 MG tablet, TAKE 1/2 (ONE-HALF) TABLET BY MOUTH AT BEDTIME, Disp: 30 tablet, Rfl: 1 .  Multiple Vitamins-Minerals (CENTRUM SILVER PO), Take 1 tablet by mouth daily., Disp: , Rfl:  .  SYNTHROID 50 MCG tablet, Take 1 tablet (50 mcg total) by mouth daily., Disp: 90 tablet, Rfl: 3 .  mirtazapine (REMERON) 7.5 MG tablet, Take 1 tablet (7.5 mg total) by mouth at bedtime., Disp: 30 tablet, Rfl: 2 Social History   Socioeconomic History  . Marital status: Widowed    Spouse name: Not on file  . Number of children: 2  . Years of education: Not on file  . Highest education level: Not on file  Occupational History  . Occupation: Retried  Scientific laboratory technician  . Financial resource strain: Not on file  . Food insecurity:    Worry: Not on file    Inability: Not on file  . Transportation needs:    Medical: Not on file    Non-medical: Not on file  Tobacco Use  . Smoking status: Never Smoker  . Smokeless tobacco: Never Used  Substance and  Sexual Activity  . Alcohol use: No    Alcohol/week: 0.0 standard drinks  . Drug use: No  . Sexual activity: Never  Lifestyle  . Physical activity:    Days per week: Not on file    Minutes per session: Not on file  . Stress: Not on file  Relationships  . Social connections:    Talks on phone: Not on file    Gets together: Not on file    Attends religious service: Not on file    Active member of club or organization: Not on file    Attends meetings of clubs or organizations: Not on file    Relationship status: Not on file  . Intimate partner violence:    Fear of current or ex partner: Not on file    Emotionally abused: Not on file    Physically abused: Not on file    Forced sexual activity: Not on file  Other Topics Concern  . Not on file  Social  History Narrative  . Not on file   Family History  Problem Relation Age of Onset  . Stroke Mother   . Miscarriages / [place] Mother   . Hyperlipidemia Mother   . Hypertension Mother   . Heart attack Father   . Hyperlipidemia Father   . Hypertension Father   . Hearing loss Father   . Miscarriages / Stillbirths Sister   . Heart attack Maternal Grandmother   . Heart attack Maternal Grandfather   . Heart attack Paternal Grandmother   . Heart attack Paternal Grandfather   . Diabetes Unknown   . Heart disease Unknown   . Kidney disease Unknown   . Heart attack Brother   . Cancer Sister     Objective: Office vital signs reviewed. BP (!) 141/74   Pulse 81   Temp (!) 97.5 F (36.4 C) (Oral)   Ht '5\' 3"'  (1.6 m)   Wt 116 lb (52.6 kg)   BMI 20.55 kg/m   Physical Examination:  General: Awake, alert, well nourished, No acute distress HEENT: Normal; no JVD Cardio: regular rate and rhythm, S1S2 heard, no murmurs appreciated Pulm: clear to auscultation bilaterally, no wheezes, rhonchi or rales; normal work of breathing on room air Extremities: warm, well perfused, 1+ LE edema to lower shins.  no cyanosis or clubbing; +2 pulses bilaterally; negative Homan.  No palpable cords along the venous system of the calves. MSK: normal gait and normal station Skin: dry; she has venous stasis changes appreciated in bilateral shins.  Assessment/ Plan: 84 y.o. female   1. Bilateral lower extremity edema Likely venous stasis/gravity dependent edema.  I reviewed her echocardiogram from 2017 which demonstrated vigorous systolic function.  We discussed use of compression hose, elevation of lower extremity's and avoidance of excessive salt intake.  She has known renal disease with creatinine of 1.77 last check in June.  This likely contributes as well.  Albumin was within normal limits so unlikely to be related to malnourishment.  She has been anemic in the past.  We will check hemoglobin today.  We  will also check thyroid level to see if this may be contributing.  Written prescription for compression hose provided.  I recommended that she follow-up in 1 month for recheck. - CMP14+EGFR - CBC with Differential - TSH   Orders Placed This Encounter  Procedures  . CMP14+EGFR  . CBC with Differential  . TSH    Ashly Windell Moulding, DO [facility] [phone]

## 2017-10-17 LAB — URINE CULTURE

## 2017-10-18 ENCOUNTER — Other Ambulatory Visit: Payer: Self-pay | Admitting: Family Medicine

## 2017-10-18 MED ORDER — CEPHALEXIN 250 MG PO CAPS: 250.0000 mg | ORAL_CAPSULE | Freq: Two times a day (BID) | ORAL | 0 refills | Status: AC

## 2017-10-29 ENCOUNTER — Other Ambulatory Visit: Payer: Self-pay | Admitting: *Deleted

## 2017-10-29 MED ORDER — DULOXETINE HCL 30 MG PO CPEP: 30.0000 mg | ORAL_CAPSULE | Freq: Every day | ORAL | 0 refills | Status: DC

## 2017-10-29 NOTE — Telephone Encounter (Signed)
Fax received [place] Refill Duloxetine 30 mg capsule Historical med Last OV 09/12/17 for anxiety Next OV 11/04/17 Please advise

## 2017-10-30 ENCOUNTER — Other Ambulatory Visit: Payer: Self-pay | Admitting: *Deleted

## 2017-10-30 MED ORDER — AMLODIPINE BESYLATE 5 MG PO TABS: 2.5000 mg | ORAL_TABLET | Freq: Every day | ORAL | 0 refills | Status: DC

## 2017-11-04 ENCOUNTER — Encounter: Payer: Self-pay | Admitting: Family Medicine

## 2017-11-04 ENCOUNTER — Ambulatory Visit (INDEPENDENT_AMBULATORY_CARE_PROVIDER_SITE_OTHER): Payer: Medicare Other | Admitting: Family Medicine

## 2017-11-04 VITALS — BP 152/75 | HR 81 | Temp 97.1°F | Ht 63.0 in | Wt 114.2 lb

## 2017-11-04 DIAGNOSIS — N183 Chronic kidney disease, stage 3 unspecified: Secondary | ICD-10-CM

## 2017-11-04 DIAGNOSIS — E039 Hypothyroidism, unspecified: Secondary | ICD-10-CM | POA: Diagnosis not present

## 2017-11-04 DIAGNOSIS — F419 Anxiety disorder, unspecified: Secondary | ICD-10-CM

## 2017-11-04 DIAGNOSIS — K219 Gastro-esophageal reflux disease without esophagitis: Secondary | ICD-10-CM | POA: Diagnosis not present

## 2017-11-04 DIAGNOSIS — F39 Unspecified mood [affective] disorder: Secondary | ICD-10-CM

## 2017-11-04 DIAGNOSIS — E782 Mixed hyperlipidemia: Secondary | ICD-10-CM | POA: Diagnosis not present

## 2017-11-04 DIAGNOSIS — I10 Essential (primary) hypertension: Secondary | ICD-10-CM

## 2017-11-04 MED ORDER — AMLODIPINE BESYLATE 5 MG PO TABS: 5.0000 mg | ORAL_TABLET | Freq: Every day | ORAL | 3 refills | Status: DC

## 2017-11-04 MED ORDER — SYNTHROID 50 MCG PO TABS: 50.0000 ug | ORAL_TABLET | Freq: Every day | ORAL | 3 refills | Status: AC

## 2017-11-04 MED ORDER — LORAZEPAM 0.5 MG PO TABS: ORAL_TABLET | ORAL | 2 refills | Status: DC

## 2017-11-04 MED ORDER — ATORVASTATIN CALCIUM 20 MG PO TABS: 20.0000 mg | ORAL_TABLET | Freq: Every day | ORAL | 3 refills | Status: DC

## 2017-11-04 NOTE — Progress Notes (Signed)
BP (!) 152/75   Pulse 81   Temp (!) 97.1 F (36.2 C) (Oral)   Ht '5\' 3"'  (1.6 m)   Wt 114 lb 3.2 oz (51.8 kg)   BMI 20.23 kg/m    Subjective:    Patient ID: Deborah Rivers, female    DOB: 20-Dec-1924, 84 y.o.   MRN: [id number]  HPI: Deborah Rivers is a 84 y.o. female presenting on 11/04/2017 for Hypertension (3 month follow up); Hyperlipidemia; and Establish Care Wendi Snipes pt )   HPI Hyperlipidemia Patient is coming in for recheck of his hyperlipidemia. The patient is currently taking atorvastatin. They deny any issues with myalgias or history of liver damage from it. They deny any focal numbness or weakness or chest pain.   GERD Patient is currently on no medication currently but says she does not need it.  She denies any major symptoms or abdominal pain or belching or burping. She denies any blood in her stool or lightheadedness or dizziness.   Hypothyroidism recheck Patient is coming in for thyroid recheck today as well. They deny any issues with hair changes or heat or cold problems or diarrhea or constipation. They deny any chest pain or palpitations. They are currently on levothyroxine 46mcrograms   Hypertension Patient is currently on amlodipine, and their blood pressure today is 152/75. Patient denies any lightheadedness or dizziness. Patient denies headaches, blurred vision, chest pains, shortness of breath, or weakness. Denies any side effects from medication and is content with current medication.   Anxiety Patient is coming in to discuss anxiety.  She has been tried on other medications besides Klonopin including Remeron which she never took and Cymbalta which she did take but does not want to continue taking it has since discontinued.  She says she is afraid of those medications and she read the labels and does not want to take them.  She says she takes about a half of the lorazepam every evening and it helps her sleep and controls her anxiety.  She says she does not  take any through the day because it makes her too groggy and sleepy.  She denies any major depression or suicidal ideations or thoughts of hurting herself.  [facility] 11/04/2017 10/15/2017 09/12/2017 08/10/2017 08/02/2017  Decreased Interest 0 0 0 0 0  Down, Depressed, Hopeless 0 0 0 0 0  PHQ - 2 Score 0 0 0 0 0     Relevant past medical, surgical, family and social history reviewed and updated as indicated. Interim medical history since our last visit reviewed. Allergies and medications reviewed and updated.  Review of Systems  Constitutional: Negative for chills and fever.  Eyes: Negative for visual disturbance.  Respiratory: Negative for chest tightness and shortness of breath.   Cardiovascular: Negative for chest pain and leg swelling.  Musculoskeletal: Negative for back pain and gait problem.  Skin: Negative for rash.  Neurological: Negative for light-headedness and headaches.  Psychiatric/Behavioral: Negative for agitation, behavioral problems, decreased concentration, dysphoric mood, self-injury, sleep disturbance and suicidal ideas. The patient is nervous/anxious.   All other systems reviewed and are negative.   Per HPI unless specifically indicated above   Allergies as of 11/04/2017      Reactions   Sulfa Antibiotics Nausea Only, Anxiety, Rash      Medication List        Accurate as of 11/04/17  2:05 PM. Always use your most recent med list.  acetaminophen 325 MG tablet Commonly known as:  TYLENOL Take 2 tablets (650 mg total) by mouth every 6 (six) hours as needed for mild pain (or Fever >/= 101).   amLODipine 5 MG tablet Commonly known as:  NORVASC Take 1 tablet (5 mg total) by mouth daily.   aspirin 81 MG tablet Take 81 mg by mouth daily.   atorvastatin 20 MG tablet Commonly known as:  LIPITOR Take 1 tablet (20 mg total) by mouth daily at 6 PM.   CENTRUM SILVER PO Take 1 tablet by mouth daily.   DULoxetine 30 MG capsule Commonly  known as:  CYMBALTA Take 1 capsule (30 mg total) by mouth daily.   ergocalciferol 50000 units capsule Commonly known as:  VITAMIN D2 Take 50,000 Units by mouth once a week. Mon   fluticasone 50 MCG/ACT nasal spray Commonly known as:  FLONASE Place 2 sprays into both nostrils daily.   LORazepam 0.5 MG tablet Commonly known as:  ATIVAN TAKE 1/2 (ONE-HALF) TABLET BY MOUTH AT BEDTIME   SYNTHROID 50 MCG tablet Generic drug:  levothyroxine Take 1 tablet (50 mcg total) by mouth daily.          Objective:    BP (!) 152/75   Pulse 81   Temp (!) 97.1 F (36.2 C) (Oral)   Ht '5\' 3"'  (1.6 m)   Wt 114 lb 3.2 oz (51.8 kg)   BMI 20.23 kg/m   Wt Readings from Last 3 Encounters:  11/04/17 114 lb 3.2 oz (51.8 kg)  10/15/17 116 lb (52.6 kg)  09/12/17 116 lb (52.6 kg)    Physical Exam  Constitutional: She is oriented to person, place, and time. She appears well-developed and well-nourished. No distress.  Eyes: Conjunctivae are normal.  Neck: Neck supple. No thyromegaly present.  Cardiovascular: Normal rate, regular rhythm, normal heart sounds and intact distal pulses.  No murmur heard. Pulmonary/Chest: Effort normal and breath sounds normal. No respiratory distress. She has no wheezes.  Musculoskeletal: Normal range of motion. She exhibits no edema or tenderness.  Lymphadenopathy:    She has no cervical adenopathy.  Neurological: She is alert and oriented to person, place, and time. Coordination normal.  Skin: Skin is warm and dry. No rash noted. She is not diaphoretic.  Psychiatric: Her behavior is normal. Judgment normal. Her mood appears anxious. She does not exhibit a depressed mood. She expresses no suicidal ideation. She expresses no suicidal plans.  Nursing note and vitals reviewed.       Assessment & Plan:   Problem List Items Addressed This Visit      Cardiovascular and Mediastinum   Essential hypertension   Relevant Medications   atorvastatin (LIPITOR) 20 MG tablet     amLODipine (NORVASC) 5 MG tablet     Digestive   GERD (gastroesophageal reflux disease)     Endocrine   Hypothyroidism   Relevant Medications   SYNTHROID 50 MCG tablet     Genitourinary   CKD (chronic kidney disease), stage III (HCC)   Relevant Orders   BMP8+EGFR     Other   Mixed hyperlipidemia - Primary   Relevant Medications   atorvastatin (LIPITOR) 20 MG tablet   amLODipine (NORVASC) 5 MG tablet   Mood disorder (HCC)   Anxiety   Relevant Medications   LORazepam (ATIVAN) 0.5 MG tablet       Follow up plan: Return in about 3 months (around 02/03/2018), or if symptoms worsen or fail to improve, for Thyroid and blood pressure and  cholesterol and kidney disease recheck.  Counseling provided for all of the vaccine components Orders Placed This Encounter  Procedures  . BMP8+EGFR    Caryl Pina, MD [facility] 11/04/2017, 2:05 PM

## 2017-11-05 LAB — BMP8+EGFR
BUN/Creatinine Ratio: 14 (ref 12–28)
BUN: 16 mg/dL (ref 10–36)
CO2: 27 mmol/L (ref 20–29)
CREATININE: 1.14 mg/dL — AB (ref 0.57–1.00)
Calcium: 9.9 mg/dL (ref 8.7–10.3)
Chloride: 104 mmol/L (ref 96–106)
GFR calc Af Amer: 49 mL/min/{1.73_m2} — ABNORMAL LOW (ref >59)
GFR calc non Af Amer: 42 mL/min/{1.73_m2} — ABNORMAL LOW (ref >59)
GLUCOSE: 85 mg/dL (ref 65–99)
POTASSIUM: 3.5 mmol/L (ref 3.5–5.2)
SODIUM: 146 mmol/L — AB (ref 134–144)

## 2017-11-13 ENCOUNTER — Ambulatory Visit (INDEPENDENT_AMBULATORY_CARE_PROVIDER_SITE_OTHER): Payer: Medicare Other | Admitting: Physician Assistant

## 2017-11-13 ENCOUNTER — Ambulatory Visit: Payer: Medicare Other

## 2017-11-13 ENCOUNTER — Encounter: Payer: Self-pay | Admitting: Physician Assistant

## 2017-11-13 VITALS — BP 146/75 | HR 75 | Temp 97.2°F | Ht 63.0 in | Wt 113.0 lb

## 2017-11-13 DIAGNOSIS — N390 Urinary tract infection, site not specified: Secondary | ICD-10-CM | POA: Diagnosis not present

## 2017-11-13 LAB — URINALYSIS, COMPLETE
BILIRUBIN UA: NEGATIVE
Glucose, UA: NEGATIVE
Nitrite, UA: NEGATIVE
PH UA: 5.5 (ref 5.0–7.5)
RBC UA: NEGATIVE
SPEC GRAV UA: 1.02 (ref 1.005–1.030)
UUROB: 0.2 mg/dL (ref 0.2–1.0)

## 2017-11-13 LAB — MICROSCOPIC EXAMINATION

## 2017-11-13 MED ORDER — CIPROFLOXACIN HCL 250 MG PO TABS: 250.0000 mg | ORAL_TABLET | Freq: Two times a day (BID) | ORAL | 0 refills | Status: DC

## 2017-11-13 NOTE — Progress Notes (Signed)
BP (!) 146/75   Pulse 75   Temp (!) 97.2 F (36.2 C)   Ht 5' 3" (1.6 m)   Wt 113 lb (51.3 kg)   BMI 20.02 kg/m    Subjective:    Patient ID: Deborah Rivers, female    DOB: [birth year], 84 y.o.   MRN: [id number]  HPI: Deborah Rivers is a 84 y.o. female presenting on 11/13/2017 for No chief complaint on file.  This patient has had several days of dysuria, frequency and nocturia. There is also pain over the bladder in the suprapubic region, no back pain. Denies leakage or hematuria.  Denies fever or chills. No pain in flank area.  Past Medical History:  Diagnosis Date  . Allergy   . Anemia   . Anxiety   . Cataract   . Depression   . Diverticulitis   . Essential hypertension   . GERD (gastroesophageal reflux disease)   . Hyperlipidemia   . Hypothyroidism   . Recurrent UTI    Relevant past medical, surgical, family and social history reviewed and updated as indicated. Interim medical history since our last visit reviewed. Allergies and medications reviewed and updated. DATA REVIEWED: CHART IN EPIC  Family History reviewed for pertinent findings.  Review of Systems  Constitutional: Negative.   HENT: Negative.   Eyes: Negative.   Respiratory: Negative.   Gastrointestinal: Negative.   Genitourinary: Positive for difficulty urinating, dysuria and urgency. Negative for flank pain.    Allergies as of 11/13/2017      Reactions   Sulfa Antibiotics Nausea Only, Anxiety, Rash      Medication List        Accurate as of 11/13/17 12:17 PM. Always use your most recent med list.          acetaminophen 325 MG tablet Commonly known as:  TYLENOL Take 2 tablets (650 mg total) by mouth every 6 (six) hours as needed for mild pain (or Fever >/= 101).   amLODipine 5 MG tablet Commonly known as:  NORVASC Take 1 tablet (5 mg total) by mouth daily.   aspirin 81 MG tablet Take 81 mg by mouth daily.   atorvastatin 20 MG tablet Commonly known as:  LIPITOR Take 1 tablet (20 mg  total) by mouth daily at 6 PM.   CENTRUM SILVER PO Take 1 tablet by mouth daily.   ciprofloxacin 250 MG tablet Commonly known as:  CIPRO Take 1 tablet (250 mg total) by mouth 2 (two) times daily.   DULoxetine 30 MG capsule Commonly known as:  CYMBALTA Take 1 capsule (30 mg total) by mouth daily.   ergocalciferol 50000 units capsule Commonly known as:  VITAMIN D2 Take 50,000 Units by mouth once a week. Mon   fluticasone 50 MCG/ACT nasal spray Commonly known as:  FLONASE Place 2 sprays into both nostrils daily.   LORazepam 0.5 MG tablet Commonly known as:  ATIVAN TAKE 1/2 (ONE-HALF) TABLET BY MOUTH AT BEDTIME   SYNTHROID 50 MCG tablet Generic drug:  levothyroxine Take 1 tablet (50 mcg total) by mouth daily.          Objective:    BP (!) 146/75   Pulse 75   Temp (!) 97.2 F (36.2 C)   Ht 5' 3" (1.6 m)   Wt 113 lb (51.3 kg)   BMI 20.02 kg/m   Allergies  Allergen Reactions  . Sulfa Antibiotics Nausea Only, Anxiety and Rash    Wt Readings from Last 3 Encounters:  11/13/17  113 lb (51.3 kg)  11/04/17 114 lb 3.2 oz (51.8 kg)  10/15/17 116 lb (52.6 kg)    Physical Exam  Constitutional: She is oriented to person, place, and time. She appears well-developed and well-nourished.  HENT:  Head: Normocephalic and atraumatic.  Eyes: Pupils are equal, round, and reactive to light. Conjunctivae are normal.  Cardiovascular: Normal rate, regular rhythm, normal heart sounds and intact distal pulses.  Pulmonary/Chest: Effort normal and breath sounds normal.  Abdominal: Soft. Bowel sounds are normal. She exhibits no distension and no mass. There is tenderness in the suprapubic area. There is no rebound, no guarding and no CVA tenderness.  Neurological: She is alert and oriented to person, place, and time. She has normal reflexes.  Skin: Skin is warm and dry. No rash noted.  Psychiatric: She has a normal mood and affect. Her behavior is normal. Judgment and thought content  normal.    Results for orders placed or performed in visit on 11/04/17  [facility]  Result Value Ref Range   Glucose 85 65 - 99 mg/dL   BUN 16 10 - 36 mg/dL   Creatinine, Ser 1.14 (H) 0.57 - 1.00 mg/dL   GFR calc non Af Amer 42 (L) >59 mL/min/1.73   GFR calc Af Amer 49 (L) >59 mL/min/1.73   BUN/Creatinine Ratio 14 12 - 28   Sodium 146 (H) 134 - 144 mmol/L   Potassium 3.5 3.5 - 5.2 mmol/L   Chloride 104 96 - 106 mmol/L   CO2 27 20 - 29 mmol/L   Calcium 9.9 8.7 - 10.3 mg/dL      Assessment & Plan:   1. Recurrent UTI - Urinalysis, Complete - Urine Culture - ciprofloxacin (CIPRO) 250 MG tablet; Take 1 tablet (250 mg total) by mouth 2 (two) times daily.  Dispense: 20 tablet; Refill: 0   Continue all other maintenance medications as listed above.  Follow up plan: No follow-ups on file.  Educational handout given for [place] PA-C [place] [address]  [place], [place] [address] [phone]   11/13/2017, 12:17 PM

## 2017-11-13 NOTE — Progress Notes (Signed)
Did not get the flu shot patient seen angel and was sick

## 2017-11-16 LAB — URINE CULTURE

## 2017-11-17 ENCOUNTER — Other Ambulatory Visit: Payer: Self-pay | Admitting: Physician Assistant

## 2017-11-20 DIAGNOSIS — M545 Low back pain: Secondary | ICD-10-CM | POA: Diagnosis not present

## 2017-11-20 DIAGNOSIS — M5136 Other intervertebral disc degeneration, lumbar region: Secondary | ICD-10-CM | POA: Diagnosis not present

## 2017-11-20 DIAGNOSIS — M5134 Other intervertebral disc degeneration, thoracic region: Secondary | ICD-10-CM | POA: Diagnosis not present

## 2017-11-21 ENCOUNTER — Telehealth: Payer: Self-pay | Admitting: Family Medicine

## 2017-11-21 NOTE — Telephone Encounter (Signed)
Aware rx ready to be picked up.

## 2017-11-27 ENCOUNTER — Telehealth: Payer: Self-pay | Admitting: Family Medicine

## 2017-11-28 NOTE — Telephone Encounter (Signed)
Patient states that she is not sure if she has UTI- apt made.

## 2017-12-02 DIAGNOSIS — M545 Low back pain: Secondary | ICD-10-CM | POA: Diagnosis not present

## 2017-12-03 ENCOUNTER — Encounter: Payer: Self-pay | Admitting: Family Medicine

## 2017-12-03 ENCOUNTER — Ambulatory Visit (INDEPENDENT_AMBULATORY_CARE_PROVIDER_SITE_OTHER): Payer: Medicare Other | Admitting: Family Medicine

## 2017-12-03 VITALS — BP 149/69 | HR 70 | Temp 97.0°F | Ht 63.0 in | Wt 112.8 lb

## 2017-12-03 DIAGNOSIS — R634 Abnormal weight loss: Secondary | ICD-10-CM | POA: Diagnosis not present

## 2017-12-03 DIAGNOSIS — N390 Urinary tract infection, site not specified: Secondary | ICD-10-CM

## 2017-12-03 LAB — URINALYSIS, COMPLETE
BILIRUBIN UA: NEGATIVE
Glucose, UA: NEGATIVE
LEUKOCYTES UA: NEGATIVE
Nitrite, UA: NEGATIVE
PH UA: 5 (ref 5.0–7.5)
RBC UA: NEGATIVE
Specific Gravity, UA: 1.015 (ref 1.005–1.030)
UUROB: 0.2 mg/dL (ref 0.2–1.0)

## 2017-12-03 LAB — MICROSCOPIC EXAMINATION
RBC, UA: NONE SEEN /hpf (ref 0–2)
Renal Epithel, UA: NONE SEEN /hpf

## 2017-12-03 NOTE — Progress Notes (Signed)
BP (!) 149/69   Pulse 70   Temp (!) 97 F (36.1 C) (Oral)   Ht 5\' 3"  (1.6 m)   Wt 112 lb 12.8 oz (51.2 kg)   BMI 19.98 kg/m    Subjective:    Patient ID: Deborah Rivers, female    DOB: Sep 23, 1924, 84 y.o.   MRN: [id number]  HPI: Deborah Rivers is a 84 y.o. female presenting on 12/03/2017 for Wants to be checked to see if her recent UTI has cleared up and Anorexia (Patient states it has been going and now she is losing weight and not feeling as good as she used to.)   HPI Weight loss and decreased appetite Patient comes in complaining of weight loss and decreased appetite and decreased desire to eat.  She says her energy is just been down as well.  She is down 212 pounds when she usually is in the 120 range.  She denies any abdominal pain or chest pain or shortness of breath or wheezing.  She denies any urinary symptoms such as dysuria or hematuria or bowel symptoms such as constipation or diarrhea.  She just says she does not have the desire to eat much and just does not eat as much in any of her meals.  She says she still makes her self smaller meals but she does not end up eating near the third of what she used to eat because she is not hungry.  She does admit that part of his factors that she now lives alone her husband is not there in her children and out there and she does not have anybody to cook for and so sometimes she makes smaller meals and does not eat as much of it.  Patient comes in to leave urine because of frequent UTIs, she denies any symptoms today but just wants to leave urine to get checked.  Relevant past medical, surgical, family and social history reviewed and updated as indicated. Interim medical history since our last visit reviewed. Allergies and medications reviewed and updated.  Review of Systems  Constitutional: Positive for appetite change and unexpected weight change. Negative for activity change, chills, fatigue and fever.  Eyes: Negative for visual  disturbance.  Respiratory: Negative for chest tightness and shortness of breath.   Cardiovascular: Negative for chest pain and leg swelling.  Gastrointestinal: Negative for abdominal pain, blood in stool, constipation, diarrhea, nausea and vomiting.  Genitourinary: Negative for difficulty urinating, dysuria, flank pain, frequency, hematuria and urgency.  Musculoskeletal: Negative for back pain and gait problem.  Skin: Negative for rash.  Neurological: Negative for light-headedness and headaches.  Psychiatric/Behavioral: Negative for agitation and behavioral problems.  All other systems reviewed and are negative.   Per HPI unless specifically indicated above   Allergies as of 12/03/2017      Reactions   Sulfa Antibiotics Nausea Only, Anxiety, Rash      Medication List        Accurate as of 12/03/17  4:25 PM. Always use your most recent med list.          acetaminophen 325 MG tablet Commonly known as:  TYLENOL Take 2 tablets (650 mg total) by mouth every 6 (six) hours as needed for mild pain (or Fever >/= 101).   amLODipine 5 MG tablet Commonly known as:  NORVASC Take 1 tablet (5 mg total) by mouth daily.   aspirin 81 MG tablet Take 81 mg by mouth daily.   atorvastatin 20 MG tablet Commonly known  as:  LIPITOR Take 1 tablet (20 mg total) by mouth daily at 6 PM.   CENTRUM SILVER PO Take 1 tablet by mouth daily.   DULoxetine 30 MG capsule Commonly known as:  CYMBALTA Take 1 capsule (30 mg total) by mouth daily.   ergocalciferol 50000 units capsule Commonly known as:  VITAMIN D2 Take 50,000 Units by mouth once a week. Mon   fluticasone 50 MCG/ACT nasal spray Commonly known as:  FLONASE Place 2 sprays into both nostrils daily.   LORazepam 0.5 MG tablet Commonly known as:  ATIVAN TAKE 1/2 (ONE-HALF) TABLET BY MOUTH AT BEDTIME   SYNTHROID 50 MCG tablet Generic drug:  levothyroxine Take 1 tablet (50 mcg total) by mouth daily.          Objective:    BP (!)  149/69   Pulse 70   Temp (!) 97 F (36.1 C) (Oral)   Ht 5\' 3"  (1.6 m)   Wt 112 lb 12.8 oz (51.2 kg)   BMI 19.98 kg/m   Wt Readings from Last 3 Encounters:  12/03/17 112 lb 12.8 oz (51.2 kg)  11/13/17 113 lb (51.3 kg)  11/04/17 114 lb 3.2 oz (51.8 kg)    Physical Exam  Constitutional: She is oriented to person, place, and time. She appears well-developed and well-nourished. No distress.  Eyes: Conjunctivae are normal.  Neck: Neck supple. No thyromegaly present.  Cardiovascular: Normal rate, regular rhythm, normal heart sounds and intact distal pulses.  No murmur heard. Pulmonary/Chest: Effort normal and breath sounds normal. No respiratory distress. She has no wheezes.  Abdominal: Soft. Bowel sounds are normal. She exhibits no distension. There is no tenderness. There is no guarding.  Lymphadenopathy:    She has no cervical adenopathy.  Neurological: She is alert and oriented to person, place, and time. Coordination normal.  Skin: Skin is warm and dry. No rash noted. She is not diaphoretic.  Psychiatric: She has a normal mood and affect. Her behavior is normal.  Nursing note and vitals reviewed.   Urinalysis: WBC 0-5, 0-10 epithelial cells, present, few bacteria, trace ketones, 1+ protein    Assessment & Plan:   Problem List Items Addressed This Visit      Genitourinary   Recurrent UTI   Relevant Orders   Urinalysis, Complete   Urine Culture    Other Visit Diagnoses    Weight loss    -  Primary   Relevant Orders   Thyroid Panel With TSH   CBC with Differential/Platelet      Patient is asymptomatic for urinary symptoms, will run culture but do not see anything on urinalysis that would be indicative of infection.  With weight loss and appetite loss, the first things I want to check is a TSH and CBC, patient also complains of some lower extremity weakness in her legs recommended for her to try off the Lipitor for couple weeks  Follow up plan: Return if symptoms worsen  or fail to improve.  Counseling provided for all of the vaccine components Orders Placed This Encounter  Procedures  . Urine Culture  . Urinalysis, Complete  . Thyroid Panel With TSH  . CBC with Differential/Platelet    Caryl Pina, MD [facility] 12/03/2017, 4:25 PM

## 2017-12-04 LAB — CBC WITH DIFFERENTIAL/PLATELET
BASOS: 1 %
Basophils Absolute: 0.1 10*3/uL (ref 0.0–0.2)
EOS (ABSOLUTE): 0.2 10*3/uL (ref 0.0–0.4)
EOS: 2 %
Hematocrit: 32.4 % — ABNORMAL LOW (ref 34.0–46.6)
Hemoglobin: 10.7 g/dL — ABNORMAL LOW (ref 11.1–15.9)
Immature Grans (Abs): 0 10*3/uL (ref 0.0–0.1)
Immature Granulocytes: 0 %
LYMPHS ABS: 3.2 10*3/uL — AB (ref 0.7–3.1)
Lymphs: 37 %
MCH: 28.6 pg (ref 26.6–33.0)
MCHC: 33 g/dL (ref 31.5–35.7)
MCV: 87 fL (ref 79–97)
MONOS ABS: 0.8 10*3/uL (ref 0.1–0.9)
Monocytes: 9 %
NEUTROS ABS: 4.3 10*3/uL (ref 1.4–7.0)
Neutrophils: 51 %
PLATELETS: 274 10*3/uL (ref 150–450)
RBC: 3.74 x10E6/uL — ABNORMAL LOW (ref 3.77–5.28)
RDW: 15.3 % (ref 12.3–15.4)
WBC: 8.5 10*3/uL (ref 3.4–10.8)

## 2017-12-04 LAB — THYROID PANEL WITH TSH
Free Thyroxine Index: 2.1 (ref 1.2–4.9)
T3 Uptake Ratio: 27 % (ref 24–39)
T4 TOTAL: 7.8 ug/dL (ref 4.5–12.0)
TSH: 0.877 u[IU]/mL (ref 0.450–4.500)

## 2017-12-05 LAB — URINE CULTURE: ORGANISM ID, BACTERIA: NO GROWTH

## 2017-12-09 ENCOUNTER — Other Ambulatory Visit: Payer: Self-pay | Admitting: Family Medicine

## 2017-12-09 ENCOUNTER — Telehealth: Payer: Self-pay | Admitting: Family Medicine

## 2017-12-09 DIAGNOSIS — M48061 Spinal stenosis, lumbar region without neurogenic claudication: Secondary | ICD-10-CM | POA: Diagnosis not present

## 2017-12-09 DIAGNOSIS — M4856XA Collapsed vertebra, not elsewhere classified, lumbar region, initial encounter for fracture: Secondary | ICD-10-CM | POA: Diagnosis not present

## 2017-12-09 MED ORDER — MEGESTROL ACETATE 20 MG PO TABS: 20.0000 mg | ORAL_TABLET | Freq: Every day | ORAL | 3 refills | Status: DC

## 2017-12-09 NOTE — Telephone Encounter (Signed)
Patient states that the name of the medicine that was that will help with her appetite - Megace. States that a doctor from [place] prescribed it.  Please advise and patient would like it sent to pharmacy.

## 2017-12-09 NOTE — Telephone Encounter (Signed)
Script was written on 9/16 but patient did not fill it until 9/27. She received 15 tablets which is a 30 day supply since she takes 1/2 tablet daily. Pharmacy has talked with her already and explained that it is too early and that she can't fill it until 12/13/17. They do have it setup to refill that day. I called patient but had to leave this information on her home voicemail.

## 2017-12-09 NOTE — Telephone Encounter (Signed)
I do not know which dose she had but I sent the 20 mg as that is the most likely dose that she would have.

## 2017-12-09 NOTE — Telephone Encounter (Signed)
Pt was told to call back and ask for Janett Billow is rtn her call.

## 2017-12-10 NOTE — Telephone Encounter (Signed)
Patient aware and verbalizes understanding. 

## 2017-12-16 ENCOUNTER — Ambulatory Visit (INDEPENDENT_AMBULATORY_CARE_PROVIDER_SITE_OTHER): Payer: Medicare Other

## 2017-12-16 ENCOUNTER — Telehealth: Payer: Self-pay | Admitting: Family Medicine

## 2017-12-16 DIAGNOSIS — Z23 Encounter for immunization: Secondary | ICD-10-CM

## 2017-12-16 MED ORDER — MEGESTROL ACETATE 625 MG/5ML PO SUSP: 625.0000 mg | Freq: Every day | ORAL | 1 refills | Status: DC

## 2017-12-16 NOTE — Telephone Encounter (Signed)
Her appt is only for a flu shot, pls advise

## 2017-12-16 NOTE — Telephone Encounter (Signed)
I sent in the liquid form for her.

## 2017-12-16 NOTE — Telephone Encounter (Signed)
Pt aware.

## 2017-12-17 DIAGNOSIS — Z961 Presence of intraocular lens: Secondary | ICD-10-CM | POA: Diagnosis not present

## 2017-12-17 DIAGNOSIS — H43393 Other vitreous opacities, bilateral: Secondary | ICD-10-CM | POA: Diagnosis not present

## 2017-12-17 DIAGNOSIS — H04123 Dry eye syndrome of bilateral lacrimal glands: Secondary | ICD-10-CM | POA: Diagnosis not present

## 2017-12-17 DIAGNOSIS — D3131 Benign neoplasm of right choroid: Secondary | ICD-10-CM | POA: Diagnosis not present

## 2017-12-18 ENCOUNTER — Telehealth: Payer: Self-pay | Admitting: *Deleted

## 2017-12-18 MED ORDER — MEGESTROL ACETATE 40 MG/ML PO SUSP: 800.0000 mg | Freq: Every day | ORAL | 1 refills | Status: DC

## 2017-12-18 NOTE — Telephone Encounter (Signed)
Yes that is fine go ahead and change. 

## 2017-12-18 NOTE — Telephone Encounter (Signed)
Megace 625/5 was over $100 for 30 days. Can they change to 40/1 and give 20 ml per day

## 2017-12-18 NOTE — Telephone Encounter (Signed)
Changed and order sent

## 2017-12-26 ENCOUNTER — Ambulatory Visit (INDEPENDENT_AMBULATORY_CARE_PROVIDER_SITE_OTHER): Payer: Medicare Other | Admitting: Family Medicine

## 2017-12-26 ENCOUNTER — Encounter: Payer: Self-pay | Admitting: Family Medicine

## 2017-12-26 VITALS — BP 147/74 | HR 78 | Temp 97.3°F | Ht 63.0 in | Wt 112.4 lb

## 2017-12-26 DIAGNOSIS — F331 Major depressive disorder, recurrent, moderate: Secondary | ICD-10-CM | POA: Diagnosis not present

## 2017-12-26 DIAGNOSIS — F419 Anxiety disorder, unspecified: Secondary | ICD-10-CM | POA: Diagnosis not present

## 2017-12-26 MED ORDER — SERTRALINE HCL 50 MG PO TABS: 50.0000 mg | ORAL_TABLET | Freq: Every day | ORAL | 5 refills | Status: DC

## 2017-12-26 NOTE — Progress Notes (Signed)
BP (!) 147/74   Pulse 78   Temp (!) 97.3 F (36.3 C) (Oral)   Ht 5\' 3"  (1.6 m)   Wt 112 lb 6.4 oz (51 kg)   BMI 19.91 kg/m    Subjective:    Patient ID: Deborah Rivers, female    DOB: [birth year], 84 y.o.   MRN: [id number]  HPI: Deborah Rivers is a 84 y.o. female presenting on 12/26/2017 for Anxiety (would like zoloft )   HPI Anxiety depression recheck Patient is coming in today to recheck for anxiety and depression.  She had been trying a medication that well for and she did not like it and she would like to go try Zoloft which she had tried in the very distant past and felt like it was a lot better for her.  She denies any suicidal ideations or thoughts of herself but she just feels like her anxiety especially is built up to a point where she needs something more.  She admits to having been mostly always an anxious person throughout her life but she is just coming to the point where she would like to get back on medication for it recently. Depression screen [facility] 2/9 12/26/2017 12/03/2017 11/13/2017 11/04/2017 10/15/2017  Decreased Interest 0 0 0 0 0  Down, Depressed, Hopeless 0 0 0 0 0  PHQ - 2 Score 0 0 0 0 0    Relevant past medical, surgical, family and social history reviewed and updated as indicated. Interim medical history since our last visit reviewed. Allergies and medications reviewed and updated.  Review of Systems  Constitutional: Negative for chills and fever.  Eyes: Negative for visual disturbance.  Respiratory: Negative for chest tightness and shortness of breath.   Cardiovascular: Negative for chest pain and leg swelling.  Skin: Negative for rash.  Neurological: Negative for light-headedness and headaches.  Psychiatric/Behavioral: Positive for dysphoric mood. Negative for agitation, behavioral problems, self-injury, sleep disturbance and suicidal ideas. The patient is nervous/anxious.   All other systems reviewed and are negative.   Per HPI unless specifically  indicated above   Allergies as of 12/26/2017      Reactions   Sulfa Antibiotics Nausea Only, Anxiety, Rash      Medication List        Accurate as of 12/26/17  4:26 PM. Always use your most recent med list.          acetaminophen 325 MG tablet Commonly known as:  TYLENOL Take 2 tablets (650 mg total) by mouth every 6 (six) hours as needed for mild pain (or Fever >/= 101).   amLODipine 5 MG tablet Commonly known as:  NORVASC Take 1 tablet (5 mg total) by mouth daily.   aspirin 81 MG tablet Take 81 mg by mouth daily.   atorvastatin 20 MG tablet Commonly known as:  LIPITOR Take 1 tablet (20 mg total) by mouth daily at 6 PM.   CENTRUM SILVER PO Take 1 tablet by mouth daily.   ergocalciferol 1.25 MG (50000 UT) capsule Commonly known as:  VITAMIN D2 Take 50,000 Units by mouth once a week. Mon   fluticasone 50 MCG/ACT nasal spray Commonly known as:  FLONASE Place 2 sprays into both nostrils daily.   LORazepam 0.5 MG tablet Commonly known as:  ATIVAN TAKE 1/2 (ONE-HALF) TABLET BY MOUTH AT BEDTIME   megestrol 40 MG/ML suspension Commonly known as:  MEGACE Take 20 mLs (800 mg total) by mouth daily.   sertraline 50 MG tablet Commonly known  as:  ZOLOFT Take 1 tablet (50 mg total) by mouth daily.   SYNTHROID 50 MCG tablet Generic drug:  levothyroxine Take 1 tablet (50 mcg total) by mouth daily.          Objective:    BP (!) 147/74   Pulse 78   Temp (!) 97.3 F (36.3 C) (Oral)   Ht 5\' 3"  (1.6 m)   Wt 112 lb 6.4 oz (51 kg)   BMI 19.91 kg/m   Wt Readings from Last 3 Encounters:  12/26/17 112 lb 6.4 oz (51 kg)  12/03/17 112 lb 12.8 oz (51.2 kg)  11/13/17 113 lb (51.3 kg)    Physical Exam  Constitutional: She is oriented to person, place, and time. She appears well-developed and well-nourished. No distress.  Eyes: Conjunctivae are normal.  Cardiovascular: Normal rate, regular rhythm, normal heart sounds and intact distal pulses.  No murmur  heard. Pulmonary/Chest: Effort normal and breath sounds normal. No respiratory distress. She has no wheezes.  Neurological: She is alert and oriented to person, place, and time. Coordination normal.  Skin: Skin is warm and dry. No rash noted. She is not diaphoretic.  Psychiatric: Her behavior is normal. Her mood appears anxious. She exhibits a depressed mood. She expresses no suicidal ideation. She expresses no suicidal plans.  Nursing note and vitals reviewed.     Assessment & Plan:   Problem List Items Addressed This Visit      Other   Depression - Primary   Relevant Medications   sertraline (ZOLOFT) 50 MG tablet   Anxiety   Relevant Medications   sertraline (ZOLOFT) 50 MG tablet       Follow up plan: Return in about 4 weeks (around 01/23/2018), or if symptoms worsen or fail to improve, for Depression anxiety recheck.  Counseling provided for all of the vaccine components No orders of the defined types were placed in this encounter.   Caryl Pina, MD [facility] 12/26/2017, 4:26 PM

## 2018-01-14 DIAGNOSIS — M5134 Other intervertebral disc degeneration, thoracic region: Secondary | ICD-10-CM | POA: Diagnosis not present

## 2018-02-03 ENCOUNTER — Ambulatory Visit: Payer: Medicare Other | Admitting: Family Medicine

## 2018-02-22 ENCOUNTER — Telehealth: Payer: Self-pay | Admitting: Family Medicine

## 2018-02-22 MED ORDER — CEPHALEXIN 500 MG PO CAPS: 500.0000 mg | ORAL_CAPSULE | Freq: Four times a day (QID) | ORAL | 0 refills | Status: DC

## 2018-02-22 NOTE — Telephone Encounter (Signed)
Please let the patient know that I sent Keflex for her, if she continues to worsen then have her come in next week.

## 2018-02-22 NOTE — Telephone Encounter (Signed)
Pt aware.

## 2018-02-25 ENCOUNTER — Ambulatory Visit: Payer: Medicare Other | Admitting: Physical Therapy

## 2018-02-27 ENCOUNTER — Encounter: Payer: Self-pay | Admitting: Physical Therapy

## 2018-02-27 ENCOUNTER — Ambulatory Visit: Payer: Medicare Other | Attending: Orthopedic Surgery | Admitting: Physical Therapy

## 2018-02-27 ENCOUNTER — Other Ambulatory Visit: Payer: Self-pay

## 2018-02-27 DIAGNOSIS — R293 Abnormal posture: Secondary | ICD-10-CM | POA: Diagnosis not present

## 2018-02-27 DIAGNOSIS — M546 Pain in thoracic spine: Secondary | ICD-10-CM | POA: Insufficient documentation

## 2018-02-27 NOTE — Therapy (Signed)
[facility] [place], [place], [address] Phone: [phone]   Fax:  [phone]  Physical Therapy Evaluation  Patient Details  Name: Deborah Rivers MRN: [id number] Date of Birth: Jul 16, 1924 Referring Provider (PT): Victorino December MD   Encounter Date: 02/27/2018  PT End of Session - 02/27/18 1209    Visit Number  1    Number of Visits  12    Date for PT Re-Evaluation  05/28/18    PT Start Time  1115    PT Stop Time  1205    PT Time Calculation (min)  50 min    Activity Tolerance  Patient tolerated treatment well    Behavior During Therapy  [facility] for tasks assessed/performed       Past Medical History:  Diagnosis Date  . Allergy   . Anemia   . Anxiety   . Cataract   . Depression   . Diverticulitis   . Essential hypertension   . GERD (gastroesophageal reflux disease)   . Hyperlipidemia   . Hypothyroidism   . Recurrent UTI     Past Surgical History:  Procedure Laterality Date  . APPENDECTOMY    . EYE SURGERY    . HERNIA REPAIR    . TOTAL VAGINAL HYSTERECTOMY      There were no vitals filed for this visit.   Subjective Assessment - 02/27/18 1138    Subjective  The patient reports midback pain over the last several weeks that at times have become very severe.  Today she reports a lower pain-level but over the last 2 days she reports her pain has been near a 10/10.      Patient Stated Goals  Reduce pain.    Currently in Pain?  Yes    Pain Score  3     Pain Location  Back    Pain Orientation  Right;Left;Mid    Pain Descriptors / Indicators  Aching    Pain Type  Acute pain    Pain Onset  More than a month ago    Pain Frequency  Constant    Pain Relieving Factors  Heat.         [facility] PT Assessment - 02/27/18 0001      Assessment   Medical Diagnosis  Degeneration of thoracic intervertbral disc.    Referring Provider (PT)  Victorino December MD    Onset Date/Surgical Date  --   Several weeks.     Precautions    Precautions  None      Restrictions   Weight Bearing Restrictions  No      Balance Screen   Has the patient fallen in the past 6 months  No    Has the patient had a decrease in activity level because of a fear of falling?   No    Is the patient reluctant to leave their home because of a fear of falling?   No      Home Environment   Living Environment  Private residence      Prior Function   Level of Independence  Independent      Posture/Postural Control   Posture/Postural Control  Postural limitations    Postural Limitations  Rounded Shoulders;Forward head;Decreased lumbar lordosis;Increased thoracic kyphosis    Posture Comments  Thoraco-lumbar scoliosis.      ROM / Strength   AROM / PROM / Strength  AROM;Strength      AROM   Overall AROM Comments  WFL for bilateral  UE's.  Functional trunk flexion though unable achieve neutral moving into thoracic extension.      Strength   Overall Strength Comments  Middle traps/Rhomboids= 4-/5.      Palpation   Palpation comment  Patient c/o diffuse pain between shoulder blades over scapular musculature.      Special Tests   Other special tests  Normal bilateral UE DTR's.      Ambulation/Gait   Gait Comments  Slow and cautious in slow trunk flexion                Objective measurements completed on examination: See above findings.      [facility] Adult PT Treatment/Exercise - 02/27/18 0001      Modalities   Modalities  Electrical Stimulation;Moist Heat      Moist Heat Therapy   Number Minutes Moist Heat  20 Minutes    Moist Heat Location  --   Thoracic.     Financial trader.   Electrical Stimulation Action  IFC    Electrical Stimulation Parameters  80-150 Hz x 20 minutes.    Electrical Stimulation Goals  Pain               PT Short Term Goals - 02/27/18 1245      PT SHORT TERM GOAL #1   Title  STG's=LTG's.        PT Long Term Goals -  02/27/18 1246      PT LONG TERM GOAL #1   Title  Independent with an HEP.    Time  6    Period  Weeks    Status  New      PT LONG TERM GOAL #2   Title  Average daily pain-level not to exceed 3/10.    Time  6    Period  Weeks    Status  New             Plan - 02/27/18 1239    Clinical Impression Statement  The patient presents to OPPT with c/o mid-back pain that can become severe at time.  She reports diffuse pain between her shoulder blades.  Her posture is remarable for thoraco-lumbar scoliosis.  Patient will benefit from skilled physical therapy intervention to address deficits and pain.    Clinical Presentation  Evolving    Clinical Presentation due to:  Not improving.    Clinical Decision Making  Low    Rehab Potential  Good    PT Frequency  2x / week    PT Duration  6 weeks    PT Treatment/Interventions  ADLs/Self Care Home Management;Cryotherapy;Electrical Stimulation;Ultrasound;Moist Heat;Therapeutic activities;Therapeutic exercise;Patient/family education;Manual techniques    PT Next Visit Plan  HMP/E'stim, Combo e'stim/U/S and STW/M to mid-thoracic region.  Corner stretch and scapular retraction with theraband.    Consulted and Agree with Plan of Care  Patient       Patient will benefit from skilled therapeutic intervention in order to improve the following deficits and impairments:  Decreased activity tolerance, Decreased range of motion, Pain, Decreased strength, Postural dysfunction  Visit Diagnosis: Pain in thoracic spine - Plan: PT plan of care cert/re-cert  Abnormal posture - Plan: PT plan of care cert/re-cert     Problem List Patient Active Problem List   Diagnosis Date Noted  . Leg cramp 08/10/2017  . Fatigue 08/10/2017  . Hypothyroidism   . Recurrent UTI   . GERD (gastroesophageal reflux disease)   .  Depression   . Cataract   . Anxiety   . Abnormal CT scan, colon 12/16/2015  . Mood disorder ([place]) 11/20/2015  . Dementia ([place]) 11/20/2015  .  CKD (chronic kidney disease), stage III ([place]) 11/20/2015  . Aortic calcification ([place]) 02/26/2014  . Essential hypertension 02/26/2014  . Mixed hyperlipidemia 02/26/2014  . Anemia 07/20/2013    Yola Paradiso, [place] MPT 02/27/2018, 12:53 PM  [facility] [address] [place], [place], [address] Phone: [phone]   Fax:  [phone]  Name: PERI KREFT MRN: [id number] Date of Birth: [birth year]

## 2018-02-28 ENCOUNTER — Telehealth: Payer: Self-pay

## 2018-02-28 NOTE — Telephone Encounter (Signed)
Patient states she has been having bad arthritis pain in her bilateral shoulders and back. States Dr. Warrick Parisian told her to talk Deborah Rivers which she has been taking but states that it is not helping. Would like to know what else she can take to help with the pain. Please advise

## 2018-02-28 NOTE — Telephone Encounter (Signed)
This will have to wait until Dr. Warrick Parisian is back. However, her kidney function is stable, she could take motrin in between her tylenol doses.

## 2018-03-03 ENCOUNTER — Ambulatory Visit (INDEPENDENT_AMBULATORY_CARE_PROVIDER_SITE_OTHER): Payer: Medicare Other | Admitting: Family Medicine

## 2018-03-03 ENCOUNTER — Encounter: Payer: Self-pay | Admitting: Family Medicine

## 2018-03-03 VITALS — BP 154/72 | HR 76 | Temp 97.3°F | Ht 63.0 in | Wt 111.8 lb

## 2018-03-03 DIAGNOSIS — K219 Gastro-esophageal reflux disease without esophagitis: Secondary | ICD-10-CM

## 2018-03-03 DIAGNOSIS — R296 Repeated falls: Secondary | ICD-10-CM

## 2018-03-03 DIAGNOSIS — I10 Essential (primary) hypertension: Secondary | ICD-10-CM | POA: Diagnosis not present

## 2018-03-03 DIAGNOSIS — E039 Hypothyroidism, unspecified: Secondary | ICD-10-CM | POA: Diagnosis not present

## 2018-03-03 DIAGNOSIS — E782 Mixed hyperlipidemia: Secondary | ICD-10-CM

## 2018-03-03 DIAGNOSIS — F331 Major depressive disorder, recurrent, moderate: Secondary | ICD-10-CM

## 2018-03-03 DIAGNOSIS — R531 Weakness: Secondary | ICD-10-CM

## 2018-03-03 DIAGNOSIS — F419 Anxiety disorder, unspecified: Secondary | ICD-10-CM

## 2018-03-03 NOTE — Progress Notes (Signed)
BP (!) 155/75   Pulse 76   Temp (!) 97.3 F (36.3 C) (Oral)   Ht _0  (1.6 m)   Wt 111 lb 12.8 oz (50.7 kg)   BMI 19.80 kg/m    Subjective:    Patient ID: Deborah Rivers, female    DOB: Jan 30, 1925, 85 y.o.   MRN: [id number]  HPI: Deborah Rivers is a 85 y.o. female presenting on 03/03/2018 for Hypertension (3 month follow up); Hyperlipidemia; Hypothyroidism; and Extremity Weakness (Patient states that she has been having leg weakness and a lot of falls.)   HPI Hypothyroidism recheck Patient is coming in for thyroid recheck today as well. They deny any issues with hair changes or heat or cold problems or diarrhea or constipation. They deny any chest pain or palpitations. They are currently on levothyroxine 50 micrograms   Hypertension Patient is currently on amlodipine, and their blood pressure today is 155/75. Patient denies any lightheadedness or dizziness. Patient denies headaches, blurred vision, chest pains, shortness of breath, or weakness. Denies any side effects from medication and is content with current medication.   Hyperlipidemia Patient is coming in for recheck of his hyperlipidemia. The patient is currently taking atorvastatin. They deny any issues with myalgias or history of liver damage from it. They deny any focal numbness or weakness or chest pain.   GERD Patient is currently on no medication currently for GERD, will monitor for now.  She denies any major symptoms or abdominal pain or belching or burping. She denies any blood in her stool or lightheadedness or dizziness.   Depression and anxiety Patient is currently taking Zoloft and lorazepam patient has a more anxious personality and she is coming in for recheck of anxiety.  She feels like her anxiousness is been up more recently because she is concerned about falling because she is feeling more weak on both of her lower extremities.  She has been having some falls over the past few months but denies anything  major for any fractures.  There is a lot of concerned about her weakness.  She is already seeing physical therapy to help with her back and strength but she is not using any kind of walker and recommended for her to use this.  Relevant past medical, surgical, family and social history reviewed and updated as indicated. Interim medical history since our last visit reviewed. Allergies and medications reviewed and updated.  Review of Systems  Constitutional: Negative for chills and fever.  Eyes: Negative for redness and visual disturbance.  Respiratory: Negative for chest tightness and shortness of breath.   Cardiovascular: Negative for chest pain and leg swelling.  Musculoskeletal: Positive for gait problem.  Skin: Negative for rash.  Neurological: Positive for weakness. Negative for light-headedness, numbness and headaches.  Psychiatric/Behavioral: Positive for dysphoric mood. Negative for agitation and behavioral problems. The patient is nervous/anxious.   All other systems reviewed and are negative.   Per HPI unless specifically indicated above   Allergies as of 03/03/2018      Reactions   Sulfa Antibiotics Nausea Only, Anxiety, Rash      Medication List       Accurate as of March 03, 2018  3:02 PM. Always use your most recent med list.        acetaminophen 325 MG tablet Commonly known as:  TYLENOL Take 2 tablets (650 mg total) by mouth every 6 (six) hours as needed for mild pain (or Fever >/= 101).   amLODipine 5 MG  tablet Commonly known as:  NORVASC Take 1 tablet (5 mg total) by mouth daily.   aspirin 81 MG tablet Take 81 mg by mouth daily.   atorvastatin 20 MG tablet Commonly known as:  LIPITOR Take 1 tablet (20 mg total) by mouth daily at 6 PM.   CENTRUM SILVER PO Take 1 tablet by mouth daily.   ergocalciferol 1.25 MG (50000 UT) capsule Commonly known as:  VITAMIN D2 Take 50,000 Units by mouth once a week. Mon   fluticasone 50 MCG/ACT nasal spray Commonly  known as:  FLONASE Place 2 sprays into both nostrils daily.   LORazepam 0.5 MG tablet Commonly known as:  ATIVAN TAKE 1/2 (ONE-HALF) TABLET BY MOUTH AT BEDTIME   megestrol 40 MG/ML suspension Commonly known as:  MEGACE Take 20 mLs (800 mg total) by mouth daily.   sertraline 50 MG tablet Commonly known as:  ZOLOFT Take 1 tablet (50 mg total) by mouth daily.   SYNTHROID 50 MCG tablet Generic drug:  levothyroxine Take 1 tablet (50 mcg total) by mouth daily.          Objective:    BP (!) 155/75   Pulse 76   Temp (!) 97.3 F (36.3 C) (Oral)   Ht _0  (1.6 m)   Wt 111 lb 12.8 oz (50.7 kg)   BMI 19.80 kg/m   Wt Readings from Last 3 Encounters:  03/03/18 111 lb 12.8 oz (50.7 kg)  12/26/17 112 lb 6.4 oz (51 kg)  12/03/17 112 lb 12.8 oz (51.2 kg)    Physical Exam Vitals signs and nursing note reviewed.  Constitutional:      General: She is not in acute distress.    Appearance: She is well-developed. She is not diaphoretic.  Eyes:     Conjunctiva/sclera: Conjunctivae normal.  Cardiovascular:     Rate and Rhythm: Normal rate and regular rhythm.     Heart sounds: Normal heart sounds. No murmur.  Pulmonary:     Effort: Pulmonary effort is normal. No respiratory distress.     Breath sounds: Normal breath sounds. No wheezing.  Musculoskeletal: Normal range of motion.        General: No tenderness.  Skin:    General: Skin is warm and dry.     Findings: No rash.  Neurological:     Mental Status: She is alert and oriented to person, place, and time.     Motor: Weakness (General 4 out of 5 strength) present.     Coordination: Coordination normal.     Gait: Gait abnormal (Weaker and shuffling gait, had to use wheelchair for patient).  Psychiatric:        Behavior: Behavior normal.         Assessment & Plan:   Problem List Items Addressed This Visit      Cardiovascular and Mediastinum   Essential hypertension   Relevant Orders   CMP14+EGFR     Digestive   GERD  (gastroesophageal reflux disease)   Relevant Orders   CBC with Differential/Platelet     Endocrine   Hypothyroidism   Relevant Orders   TSH     Other   Mixed hyperlipidemia - Primary   Relevant Orders   Lipid panel   Depression   Relevant Orders   CBC with Differential/Platelet   Anxiety   Relevant Orders   CBC with Differential/Platelet    Other Visit Diagnoses    Recurrent falls       Generalized weakness  Patient is already seeing physical therapy for strength and balance, recommended for her to continue physical therapy and recommended for her to use a walker, she thinks she has been at home recommended for her to take it to physical therapy with her.  She has been feeling like she is been getting weaker over the past few years but this is definitely been more significant today just walking in.   Her blood pressure is more elevated today but we will continue to monitor, it was very difficult for her to walk and so we think that is why it was more elevated today. Follow up plan: Return in about 3 months (around 06/02/2018), or if symptoms worsen or fail to improve, for Recheck thyroid and hypertension.  Counseling provided for all of the vaccine components Orders Placed This Encounter  Procedures  . CBC with Differential/Platelet  . CMP14+EGFR  . Lipid panel  . TSH    Caryl Pina, MD [facility] 03/03/2018, 3:02 PM

## 2018-03-04 ENCOUNTER — Telehealth: Payer: Self-pay | Admitting: Family Medicine

## 2018-03-04 LAB — LIPID PANEL
CHOLESTEROL TOTAL: 161 mg/dL (ref 100–199)
Chol/HDL Ratio: 2.5 ratio (ref 0.0–4.4)
HDL: 64 mg/dL (ref >39)
LDL Calculated: 80 mg/dL (ref 0–99)
Triglycerides: 84 mg/dL (ref 0–149)
VLDL Cholesterol Cal: 17 mg/dL (ref 5–40)

## 2018-03-04 LAB — CMP14+EGFR
ALK PHOS: 58 IU/L (ref 39–117)
ALT: 11 IU/L (ref 0–32)
AST: 19 IU/L (ref 0–40)
Albumin/Globulin Ratio: 2.2 (ref 1.2–2.2)
Albumin: 4.7 g/dL — ABNORMAL HIGH (ref 3.2–4.6)
BUN / CREAT RATIO: 17 (ref 12–28)
BUN: 25 mg/dL (ref 10–36)
Bilirubin Total: 0.6 mg/dL (ref 0.0–1.2)
CO2: 18 mmol/L — AB (ref 20–29)
CREATININE: 1.47 mg/dL — AB (ref 0.57–1.00)
Calcium: 9.6 mg/dL (ref 8.7–10.3)
Chloride: 108 mmol/L — ABNORMAL HIGH (ref 96–106)
GFR calc Af Amer: 36 mL/min/{1.73_m2} — ABNORMAL LOW (ref >59)
GFR calc non Af Amer: 31 mL/min/{1.73_m2} — ABNORMAL LOW (ref >59)
GLUCOSE: 78 mg/dL (ref 65–99)
Globulin, Total: 2.1 g/dL (ref 1.5–4.5)
Potassium: 3.9 mmol/L (ref 3.5–5.2)
Sodium: 145 mmol/L — ABNORMAL HIGH (ref 134–144)
TOTAL PROTEIN: 6.8 g/dL (ref 6.0–8.5)

## 2018-03-04 LAB — CBC WITH DIFFERENTIAL/PLATELET
BASOS: 1 %
Basophils Absolute: 0.1 10*3/uL (ref 0.0–0.2)
EOS (ABSOLUTE): 0.2 10*3/uL (ref 0.0–0.4)
EOS: 2 %
Hematocrit: 33.3 % — ABNORMAL LOW (ref 34.0–46.6)
Hemoglobin: 11.3 g/dL (ref 11.1–15.9)
IMMATURE GRANULOCYTES: 0 %
Immature Grans (Abs): 0 10*3/uL (ref 0.0–0.1)
LYMPHS ABS: 2.7 10*3/uL (ref 0.7–3.1)
Lymphs: 28 %
MCH: 30.1 pg (ref 26.6–33.0)
MCHC: 33.9 g/dL (ref 31.5–35.7)
MCV: 89 fL (ref 79–97)
MONOS ABS: 1 10*3/uL — AB (ref 0.1–0.9)
Monocytes: 10 %
Neutrophils Absolute: 5.9 10*3/uL (ref 1.4–7.0)
Neutrophils: 59 %
Platelets: 308 10*3/uL (ref 150–450)
RBC: 3.76 x10E6/uL — ABNORMAL LOW (ref 3.77–5.28)
RDW: 14.1 % (ref 11.7–15.4)
WBC: 9.9 10*3/uL (ref 3.4–10.8)

## 2018-03-04 LAB — TSH: TSH: 1.29 u[IU]/mL (ref 0.450–4.500)

## 2018-03-04 NOTE — Telephone Encounter (Signed)
Patient was seen 03/03/18

## 2018-03-04 NOTE — Addendum Note (Signed)
Addended by: Ilean China on: 03/04/2018 10:32 AM   Modules accepted: Orders

## 2018-03-05 ENCOUNTER — Ambulatory Visit: Payer: Medicare Other | Admitting: Physical Therapy

## 2018-03-05 ENCOUNTER — Encounter: Payer: Self-pay | Admitting: Physical Therapy

## 2018-03-05 DIAGNOSIS — R293 Abnormal posture: Secondary | ICD-10-CM | POA: Diagnosis not present

## 2018-03-05 DIAGNOSIS — M546 Pain in thoracic spine: Secondary | ICD-10-CM

## 2018-03-05 NOTE — Therapy (Signed)
[facility] [place], [place], [address] Phone: [phone]   Fax:  [phone]  Physical Therapy Treatment  Patient Details  Name: Deborah Rivers MRN: [id number] Date of Birth: [birth year] Referring Provider (PT): Victorino December MD   Encounter Date: 03/05/2018  PT End of Session - 03/05/18 1437    Visit Number  2    Number of Visits  12    Date for PT Re-Evaluation  05/28/18    PT Start Time  1300    PT Stop Time  1350    PT Time Calculation (min)  50 min    Activity Tolerance  Patient tolerated treatment well    Behavior During Therapy  [facility] for tasks assessed/performed       Past Medical History:  Diagnosis Date  . Allergy   . Anemia   . Anxiety   . Cataract   . Depression   . Diverticulitis   . Essential hypertension   . GERD (gastroesophageal reflux disease)   . Hyperlipidemia   . Hypothyroidism   . Recurrent UTI     Past Surgical History:  Procedure Laterality Date  . APPENDECTOMY    . EYE SURGERY    . HERNIA REPAIR    . TOTAL VAGINAL HYSTERECTOMY      There were no vitals filed for this visit.      [facility] PT Assessment - 03/05/18 0001      Assessment   Medical Diagnosis  Degeneration of thoracic intervertbral disc.    Referring Provider (PT)  Victorino December MD      Precautions   Precautions  None      Restrictions   Weight Bearing Restrictions  No                   [facility] Adult PT Treatment/Exercise - 03/05/18 0001      Exercises   Exercises  Shoulder      Shoulder Exercises: Seated   Retraction  AROM;Both;20 reps    Row  Strengthening;Both;20 reps;Theraband    Theraband Level (Shoulder Row)  Level 1 (Yellow)    Other Seated Exercises  thoracic rotations 2x10 each    Other Seated Exercises  shoulder rolls 2x10      Shoulder Exercises: ROM/Strengthening   UBE (Upper Arm Bike)  120 RPM x10mins  3 fwd, 3 bwd      Shoulder Exercises: Stretch   Corner Stretch  3 reps;30  seconds      Modalities   Modalities  Electrical Stimulation;Moist Heat      Moist Heat Therapy   Number Minutes Moist Heat  15 Minutes    Moist Heat Location  Other (comment)   thoracic spine     Electrical Stimulation   Electrical Stimulation Location  thoracic spine    Electrical Stimulation Action  IFC    Electrical Stimulation Parameters  80-150 hz x15 mins    Electrical Stimulation Goals  Pain             PT Education - 03/05/18 1438    Education Details  brief overview of each exercise and it purpose, use of cane for safety    Person(s) Educated  Patient;Child(ren)    Methods  Explanation    Comprehension  Verbalized understanding       PT Short Term Goals - 02/27/18 1245      PT SHORT TERM GOAL #1   Title  STG's=LTG's.        PT Long  Term Goals - 02/27/18 1246      PT LONG TERM GOAL #1   Title  Independent with an HEP.    Time  6    Period  Weeks    Status  New      PT LONG TERM GOAL #2   Title  Average daily pain-level not to exceed 3/10.    Time  6    Period  Weeks    Status  New            Plan - 03/05/18 1349    Clinical Impression Statement  Patient was able to tolerate treatment well despite reports of thoracic pain. Patient guided through thoracic and postural exercises providing explanation for each. Patient required multiple cues for proper form and technique but did better when she had a visual cue. Normal response to modalities upon removal. Patient's daughter inquired about her mother's use of a cane. PT instructed we can begin training for her to become more comfortable with gait with a cane and to bring cane with her next visit. Patient reported agreement    Clinical Presentation  Evolving    Clinical Decision Making  Low    Rehab Potential  Good    PT Frequency  2x / week    PT Duration  6 weeks    PT Treatment/Interventions  ADLs/Self Care Home Management;Cryotherapy;Electrical Stimulation;Ultrasound;Moist Heat;Therapeutic  activities;Therapeutic exercise;Patient/family education;Manual techniques    PT Next Visit Plan  HMP/E'stim, Combo e'stim/U/S and STW/M to mid-thoracic region.  Corner stretch and scapular retraction with theraband.    Consulted and Agree with Plan of Care  Patient       Patient will benefit from skilled therapeutic intervention in order to improve the following deficits and impairments:  Decreased activity tolerance, Decreased range of motion, Pain, Decreased strength, Postural dysfunction  Visit Diagnosis: Pain in thoracic spine  Abnormal posture     Problem List Patient Active Problem List   Diagnosis Date Noted  . Leg cramp 08/10/2017  . Fatigue 08/10/2017  . Hypothyroidism   . Recurrent UTI   . GERD (gastroesophageal reflux disease)   . Depression   . Cataract   . Anxiety   . Abnormal CT scan, colon 12/16/2015  . Mood disorder ([place]) 11/20/2015  . Dementia ([place]) 11/20/2015  . CKD (chronic kidney disease), stage III ([place]) 11/20/2015  . Aortic calcification ([place]) 02/26/2014  . Essential hypertension 02/26/2014  . Mixed hyperlipidemia 02/26/2014  . Anemia 07/20/2013   Deborah Rivers, PT, DPT 03/05/2018, 2:42 PM  [facility] [address] [place], [place], [address] Phone: [phone]   Fax:  [phone]  Name: Deborah Rivers MRN: [id number] Date of Birth: 24-Jan-1925

## 2018-03-11 ENCOUNTER — Telehealth: Payer: Self-pay | Admitting: *Deleted

## 2018-03-11 ENCOUNTER — Ambulatory Visit: Payer: Medicare Other | Admitting: Physical Therapy

## 2018-03-11 ENCOUNTER — Encounter: Payer: Self-pay | Admitting: Physical Therapy

## 2018-03-11 DIAGNOSIS — M546 Pain in thoracic spine: Secondary | ICD-10-CM | POA: Diagnosis not present

## 2018-03-11 DIAGNOSIS — R293 Abnormal posture: Secondary | ICD-10-CM | POA: Diagnosis not present

## 2018-03-11 NOTE — Therapy (Addendum)
[facility] [place], [place], [address] Phone: [phone]   Fax:  [phone]  Physical Therapy Treatment  Patient Details  Name: Deborah Rivers MRN: [id number] Date of Birth: [birth year] Referring Provider (PT): Victorino December MD   Encounter Date: 03/11/2018  PT End of Session - 03/11/18 1313    Visit Number  3    Number of Visits  12    Date for PT Re-Evaluation  05/28/18    PT Start Time  1311   delayed start to treatment   PT Stop Time  1358    PT Time Calculation (min)  47 min    Activity Tolerance  Patient tolerated treatment well    Behavior During Therapy  [facility] for tasks assessed/performed       Past Medical History:  Diagnosis Date  . Allergy   . Anemia   . Anxiety   . Cataract   . Depression   . Diverticulitis   . Essential hypertension   . GERD (gastroesophageal reflux disease)   . Hyperlipidemia   . Hypothyroidism   . Recurrent UTI     Past Surgical History:  Procedure Laterality Date  . APPENDECTOMY    . EYE SURGERY    . HERNIA REPAIR    . TOTAL VAGINAL HYSTERECTOMY      There were no vitals filed for this visit.  Subjective Assessment - 03/11/18 1312    Subjective  Reports that she brought her cane in. Reports that her LEs are giving her more trouble today than her low back.    Patient Stated Goals  Reduce pain.    Currently in Pain?  Yes    Pain Score  --   No pain score provided   Pain Location  Leg    Pain Orientation  Right;Left    Pain Descriptors / Indicators  Discomfort    Pain Type  Acute pain    Pain Onset  More than a month ago                       [facility] Adult PT Treatment/Exercise - 03/11/18 0001      Exercises   Exercises  Shoulder;Lumbar      Lumbar Exercises: Seated   Long Arc Quad on Chair  AROM;Both;15 reps      Shoulder Exercises: Seated   Retraction  Strengthening;Both;15 reps;Theraband    Theraband Level (Shoulder Retraction)  Level 1  (Yellow)    Horizontal ABduction  Strengthening;Both;20 reps;Theraband    Theraband Level (Shoulder Horizontal ABduction)  Level 1 (Yellow)      Shoulder Exercises: ROM/Strengthening   UBE (Upper Arm Bike)  120 RPM x8 min    forward and backward   X to V Arms  x15 reps      Modalities   Modalities  Electrical Stimulation;Moist Heat      Moist Heat Therapy   Number Minutes Moist Heat  10 Minutes    Moist Heat Location  Other (comment)   thoracic spine     Electrical Stimulation   Electrical Stimulation Location  thoracic spine    Electrical Stimulation Action  IFC    Electrical Stimulation Parameters  80-150 hz x10 min    Electrical Stimulation Goals  Pain               PT Short Term Goals - 02/27/18 1245      PT SHORT TERM GOAL #1   Title  STG's=LTG's.  PT Long Term Goals - 02/27/18 1246      PT LONG TERM GOAL #1   Title  Independent with an HEP.    Time  6    Period  Weeks    Status  New      PT LONG TERM GOAL #2   Title  Average daily pain-level not to exceed 3/10.    Time  6    Period  Weeks    Status  New            Plan - 03/11/18 1452    Clinical Impression Statement  Patient presented in clinic with reports of greater LE discomfort today. Patient presented with [facility] and was highly encouraged to continue using the [facility]. Patient did stumble and reported being scared while in clinic. Required assist from [facility], DPT to regain balance. Patient's SPC was adjusted to her height and educated that some pressure should be transferred through AD but she should not lean on it. Patient required frequent VCs and demo to redirect her attention back to directed exercises. No complaints of pain reported by patient during treatment. Normal modalities response noted following removal of the modalities.    Rehab Potential  Good    PT Frequency  2x / week    PT Duration  6 weeks    PT Treatment/Interventions  ADLs/Self Care Home  Management;Cryotherapy;Electrical Stimulation;Ultrasound;Moist Heat;Therapeutic activities;Therapeutic exercise;Patient/family education;Manual techniques    PT Next Visit Plan  Continue with lumbar and postural strengthening per MPT POC.    Consulted and Agree with Plan of Care  Patient       Patient will benefit from skilled therapeutic intervention in order to improve the following deficits and impairments:  Decreased activity tolerance, Decreased range of motion, Pain, Decreased strength, Postural dysfunction  Visit Diagnosis: Pain in thoracic spine  Abnormal posture     Problem List Patient Active Problem List   Diagnosis Date Noted  . Leg cramp 08/10/2017  . Fatigue 08/10/2017  . Hypothyroidism   . Recurrent UTI   . GERD (gastroesophageal reflux disease)   . Depression   . Cataract   . Anxiety   . Abnormal CT scan, colon 12/16/2015  . Mood disorder ([place]) 11/20/2015  . Dementia ([place]) 11/20/2015  . CKD (chronic kidney disease), stage III ([place]) 11/20/2015  . Aortic calcification ([place]) 02/26/2014  . Essential hypertension 02/26/2014  . Mixed hyperlipidemia 02/26/2014  . Anemia 07/20/2013    Standley Brooking, PTA 03/11/2018, 3:00 PM  [facility] [address] [place], [place], [address] Phone: [phone]   Fax:  [phone]  Name: Deborah Rivers MRN: [id number] Date of Birth: [birth year]

## 2018-03-11 NOTE — Telephone Encounter (Signed)
Fax from [facility] for Vit D 50,000 #12 No vit D level. Historical med Please advise

## 2018-03-12 ENCOUNTER — Telehealth: Payer: Self-pay | Admitting: Family Medicine

## 2018-03-12 NOTE — Telephone Encounter (Signed)
Tell her we will check her vitamin D level the next time she comes in and if it is low then we will continue it.

## 2018-03-12 NOTE — Telephone Encounter (Signed)
Aware.  Need to follow up with ortho specialist.

## 2018-03-12 NOTE — Telephone Encounter (Signed)
Aware.  She can take vitamin D daily but needs to buy at [facility] or pharmacies.

## 2018-03-12 NOTE — Telephone Encounter (Signed)
I would go back and see Dr. Gladstone Lighter

## 2018-03-13 ENCOUNTER — Other Ambulatory Visit (INDEPENDENT_AMBULATORY_CARE_PROVIDER_SITE_OTHER): Payer: Medicare Other

## 2018-03-13 ENCOUNTER — Encounter: Payer: Medicare Other | Admitting: Physical Therapy

## 2018-03-13 ENCOUNTER — Other Ambulatory Visit: Payer: Self-pay | Admitting: Orthopedic Surgery

## 2018-03-13 DIAGNOSIS — M545 Low back pain, unspecified: Secondary | ICD-10-CM

## 2018-03-13 DIAGNOSIS — G8929 Other chronic pain: Secondary | ICD-10-CM

## 2018-03-13 DIAGNOSIS — M48061 Spinal stenosis, lumbar region without neurogenic claudication: Secondary | ICD-10-CM | POA: Diagnosis not present

## 2018-03-13 DIAGNOSIS — M5134 Other intervertebral disc degeneration, thoracic region: Secondary | ICD-10-CM | POA: Diagnosis not present

## 2018-03-13 DIAGNOSIS — M4856XA Collapsed vertebra, not elsewhere classified, lumbar region, initial encounter for fracture: Secondary | ICD-10-CM | POA: Diagnosis not present

## 2018-03-18 ENCOUNTER — Ambulatory Visit: Payer: Medicare Other | Admitting: Cardiovascular Disease

## 2018-03-19 ENCOUNTER — Other Ambulatory Visit: Payer: Self-pay | Admitting: *Deleted

## 2018-03-19 ENCOUNTER — Encounter: Payer: Self-pay | Admitting: *Deleted

## 2018-03-19 MED ORDER — ASPIRIN 81 MG PO TABS: 81.0000 mg | ORAL_TABLET | Freq: Every day | ORAL | 1 refills | Status: DC

## 2018-03-20 ENCOUNTER — Other Ambulatory Visit: Payer: Self-pay | Admitting: *Deleted

## 2018-03-20 ENCOUNTER — Encounter: Payer: Self-pay | Admitting: Family Medicine

## 2018-03-20 ENCOUNTER — Ambulatory Visit (INDEPENDENT_AMBULATORY_CARE_PROVIDER_SITE_OTHER): Payer: Medicare Other | Admitting: Family Medicine

## 2018-03-20 VITALS — BP 128/82 | HR 92 | Temp 96.9°F

## 2018-03-20 DIAGNOSIS — F0390 Unspecified dementia without behavioral disturbance: Secondary | ICD-10-CM | POA: Diagnosis not present

## 2018-03-20 DIAGNOSIS — R350 Frequency of micturition: Secondary | ICD-10-CM

## 2018-03-20 DIAGNOSIS — R399 Unspecified symptoms and signs involving the genitourinary system: Secondary | ICD-10-CM

## 2018-03-20 NOTE — Progress Notes (Signed)
Subjective:    Patient ID: Deborah Rivers, female    DOB: 1924-03-24, 85 y.o.   MRN: [id number]  Chief Complaint:  Altered Mental Status and Urinary Tract Infection   HPI: Deborah Rivers is a 85 y.o. female presenting on 03/20/2018 for Altered Mental Status and Urinary Tract Infection  Pt presents today with caregiver who sees her every 2 weeks. Caregiver reports increased confusion over the last 2 weeks. States the pt has been calling her multiple times a day and leaving confusing messages. The pt states she has had increased urination at night. States she has not had fever, chills, or dysuria. She does feel like her her appetite is decreased. States she does have intermittent lower back pain. States this is due to a fall she had 4 years ago. Pt does not remember calling her caregiver multiple times.   Relevant past medical, surgical, family, and social history reviewed and updated as indicated.  Allergies and medications reviewed and updated.   Past Medical History:  Diagnosis Date  . Allergy   . Anemia   . Anxiety   . Cataract   . Depression   . Diverticulitis   . Essential hypertension   . GERD (gastroesophageal reflux disease)   . Hyperlipidemia   . Hypothyroidism   . Recurrent UTI     Past Surgical History:  Procedure Laterality Date  . APPENDECTOMY    . EYE SURGERY    . HERNIA REPAIR    . TOTAL VAGINAL HYSTERECTOMY      Social History   Socioeconomic History  . Marital status: Widowed    Spouse name: Not on file  . Number of children: 2  . Years of education: Not on file  . Highest education level: Not on file  Occupational History  . Occupation: Retried  Scientific laboratory technician  . Financial resource strain: Not on file  . Food insecurity:    Worry: Not on file    Inability: Not on file  . Transportation needs:    Medical: Not on file    Non-medical: Not on file  Tobacco Use  . Smoking status: Never Smoker  . Smokeless tobacco: Never Used  Substance and  Sexual Activity  . Alcohol use: No    Alcohol/week: 0.0 standard drinks  . Drug use: No  . Sexual activity: Never  Lifestyle  . Physical activity:    Days per week: Not on file    Minutes per session: Not on file  . Stress: Not on file  Relationships  . Social connections:    Talks on phone: Not on file    Gets together: Not on file    Attends religious service: Not on file    Active member of club or organization: Not on file    Attends meetings of clubs or organizations: Not on file    Relationship status: Not on file  . Intimate partner violence:    Fear of current or ex partner: Not on file    Emotionally abused: Not on file    Physically abused: Not on file    Forced sexual activity: Not on file  Other Topics Concern  . Not on file  Social History Narrative  . Not on file    Outpatient Encounter Medications as of 03/20/2018  Medication Sig  . acetaminophen (TYLENOL) 325 MG tablet Take 2 tablets (650 mg total) by mouth every 6 (six) hours as needed for mild pain (or Fever >/= 101).  Marland Kitchen  amLODipine (NORVASC) 5 MG tablet Take 1 tablet (5 mg total) by mouth daily.  Marland Kitchen aspirin 81 MG tablet Take 1 tablet (81 mg total) by mouth daily.  Marland Kitchen atorvastatin (LIPITOR) 20 MG tablet Take 1 tablet (20 mg total) by mouth daily at 6 PM.  . ergocalciferol (VITAMIN D2) 50000 UNITS capsule Take 50,000 Units by mouth once a week. Mon  . fluticasone (FLONASE) 50 MCG/ACT nasal spray Place 2 sprays into both nostrils daily.  Marland Kitchen LORazepam (ATIVAN) 0.5 MG tablet TAKE 1/2 (ONE-HALF) TABLET BY MOUTH AT BEDTIME  . megestrol (MEGACE) 40 MG/ML suspension Take 20 mLs (800 mg total) by mouth daily.  . Multiple Vitamins-Minerals (CENTRUM SILVER PO) Take 1 tablet by mouth daily.  . sertraline (ZOLOFT) 50 MG tablet Take 1 tablet (50 mg total) by mouth daily.  Marland Kitchen SYNTHROID 50 MCG tablet Take 1 tablet (50 mcg total) by mouth daily.   No facility-administered encounter medications on file as of 03/20/2018.      Allergies  Allergen Reactions  . Sulfa Antibiotics Nausea Only, Anxiety and Rash    Review of Systems  Constitutional: Positive for fatigue. Negative for chills and fever.  Respiratory: Negative for cough, chest tightness and shortness of breath.   Cardiovascular: Negative for chest pain, palpitations and leg swelling.  Gastrointestinal: Negative for abdominal pain, constipation, diarrhea, nausea and vomiting.  Genitourinary: Positive for frequency and urgency. Negative for difficulty urinating, dysuria, flank pain, hematuria, vaginal bleeding and vaginal discharge.  Musculoskeletal: Positive for back pain.  Neurological: Positive for weakness (general). Negative for dizziness and headaches.  Psychiatric/Behavioral: Positive for confusion (increased from baseline per caregiver).  All other systems reviewed and are negative.       Objective:    BP 128/82   Pulse 92   Temp (!) 96.9 F (36.1 C) (Oral)    Wt Readings from Last 3 Encounters:  03/03/18 111 lb 12.8 oz (50.7 kg)  12/26/17 112 lb 6.4 oz (51 kg)  12/03/17 112 lb 12.8 oz (51.2 kg)    Physical Exam Vitals signs and nursing note reviewed.  Constitutional:      General: She is not in acute distress.    Appearance: Normal appearance. She is well-developed and well-groomed. She is not ill-appearing or toxic-appearing.  HENT:     Head: Normocephalic and atraumatic.     Mouth/Throat:     Mouth: Mucous membranes are moist.     Pharynx: Oropharynx is clear.  Eyes:     Conjunctiva/sclera: Conjunctivae normal.     Pupils: Pupils are equal, round, and reactive to light.  Neck:     Musculoskeletal: Normal range of motion and neck supple.  Cardiovascular:     Rate and Rhythm: Normal rate and regular rhythm.     Heart sounds: Normal heart sounds. No murmur. No friction rub. No gallop.   Pulmonary:     Effort: Pulmonary effort is normal. No respiratory distress.     Breath sounds: Normal breath sounds.  Abdominal:      General: Bowel sounds are normal. There is no distension.     Palpations: Abdomen is soft.     Tenderness: There is no abdominal tenderness. There is no right CVA tenderness, left CVA tenderness, guarding or rebound.  Skin:    General: Skin is warm and dry.     Capillary Refill: Capillary refill takes less than 2 seconds.  Neurological:     General: No focal deficit present.     Mental Status: She is alert  and oriented to person, place, and time.     Motor: Weakness (generalized, 4/5 strength) present.     Gait: Gait abnormal (slow, shuffling gait).  Psychiatric:        Mood and Affect: Mood normal.        Speech: Speech normal.        Behavior: Behavior is cooperative.     Results for orders placed or performed in visit on 03/03/18  CBC with Differential/Platelet  Result Value Ref Range   WBC 9.9 3.4 - 10.8 x10E3/uL   RBC 3.76 (L) 3.77 - 5.28 x10E6/uL   Hemoglobin 11.3 11.1 - 15.9 g/dL   Hematocrit 33.3 (L) 34.0 - 46.6 %   MCV 89 79 - 97 fL   MCH 30.1 26.6 - 33.0 pg   MCHC 33.9 31.5 - 35.7 g/dL   RDW 14.1 11.7 - 15.4 %   Platelets 308 150 - 450 x10E3/uL   Neutrophils 59 Not Estab. %   Lymphs 28 Not Estab. %   Monocytes 10 Not Estab. %   Eos 2 Not Estab. %   Basos 1 Not Estab. %   Neutrophils Absolute 5.9 1.4 - 7.0 x10E3/uL   Lymphocytes Absolute 2.7 0.7 - 3.1 x10E3/uL   Monocytes Absolute 1.0 (H) 0.1 - 0.9 x10E3/uL   EOS (ABSOLUTE) 0.2 0.0 - 0.4 x10E3/uL   Basophils Absolute 0.1 0.0 - 0.2 x10E3/uL   Immature Granulocytes 0 Not Estab. %   Immature Grans (Abs) 0.0 0.0 - 0.1 x10E3/uL  CMP14+EGFR  Result Value Ref Range   Glucose 78 65 - 99 mg/dL   BUN 25 10 - 36 mg/dL   Creatinine, Ser 1.47 (H) 0.57 - 1.00 mg/dL   GFR calc non Af Amer 31 (L) >59 mL/min/1.73   GFR calc Af Amer 36 (L) >59 mL/min/1.73   BUN/Creatinine Ratio 17 12 - 28   Sodium 145 (H) 134 - 144 mmol/L   Potassium 3.9 3.5 - 5.2 mmol/L   Chloride 108 (H) 96 - 106 mmol/L   CO2 18 (L) 20 - 29 mmol/L    Calcium 9.6 8.7 - 10.3 mg/dL   Total Protein 6.8 6.0 - 8.5 g/dL   Albumin 4.7 (H) 3.2 - 4.6 g/dL   Globulin, Total 2.1 1.5 - 4.5 g/dL   Albumin/Globulin Ratio 2.2 1.2 - 2.2   Bilirubin Total 0.6 0.0 - 1.2 mg/dL   Alkaline Phosphatase 58 39 - 117 IU/L   AST 19 0 - 40 IU/L   ALT 11 0 - 32 IU/L  Lipid panel  Result Value Ref Range   Cholesterol, Total 161 100 - 199 mg/dL   Triglycerides 84 0 - 149 mg/dL   HDL 64 >39 mg/dL   VLDL Cholesterol Cal 17 5 - 40 mg/dL   LDL Calculated 80 0 - 99 mg/dL   Chol/HDL Ratio 2.5 0.0 - 4.4 ratio  TSH  Result Value Ref Range   TSH 1.290 0.450 - 4.500 uIU/mL       Pertinent labs & imaging results that were available during my care of the patient were reviewed by me and considered in my medical decision making.  Assessment & Plan:  Terrea was seen today for altered mental status and urinary tract infection.  Diagnoses and all orders for this visit:  Frequency of urination Unable to obtain urine sample in office. Urine cup and collection hat sent home with pt and caregiver. Pt will collect specimen at home and bring back to office.   Dementia without  behavioral disturbance, unspecified dementia type ([place]) Follow up appointment with PCP in  5 days. Unable to rule out UTI due to inability to collect urine in office today. Pt to bring back specimen. Symptoms may be increasing dementia. Discussed getting help in the home more frequently.     Continue all other maintenance medications.  Follow up plan: Return in about 5 days (around 03/25/2018).  Educational handout given for dementia  The above assessment and management plan was discussed with the patient. The patient verbalized understanding of and has agreed to the management plan. Patient is aware to call the clinic if symptoms persist or worsen. Patient is aware when to return to the clinic for a follow-up visit. Patient educated on when it is appropriate to go to the emergency department.    Monia Pouch, FNP-C [facility] [phone]

## 2018-03-20 NOTE — Patient Instructions (Signed)
Dementia  Dementia means losing some of your brain ability. People with dementia may have problems with:  · Memory.  · Making decisions.  · Behavior.  · Speaking.  · Thinking.  · Solving problems.  Follow these instructions at home:  Medicine  · Take over-the-counter and prescription medicines only as told by your doctor.  · Avoid taking medicines that can change how you think. These include pain or sleeping medicines.  Lifestyle    · Make healthy choices:  ? Be active as told by your doctor.  ? Do not use any tobacco products, such as cigarettes, chewing tobacco, and e-cigarettes. If you need help quitting, ask your doctor.  ? Eat a healthy diet.  ? When you get stressed, do something to help yourself relax. Your doctor can give you tips.  ? Spend time with other people.  · Drink enough fluid to keep your pee (urine) clear or pale yellow.  · Make sure you get good sleep. Use these tips to help you get a good night's rest:  ? Try not to take naps during the day.  ? Keep your sleeping area dark and cool.  ? In the few hours before you go to bed, try not to do any exercise.  ? Try not to have foods and drinks with caffeine in the evening.  General instructions  · Talk with your doctor to figure out:  ? What you need help with.  ? What your safety needs are.  · If you were given a bracelet that tracks your location, make sure to wear it.  · Keep all follow-up visits as told by your doctor. This is important.  Contact a doctor if:  · You have any new problems.  · You have problems with choking or swallowing.  · You have any symptoms of a different sickness.  Get help right away if:  · You have a fever.  · You feel mixed up (confused) or more mixed up than before.  · You have new sleepiness.  · You have sleepiness that gets worse.  · You have a hard time staying awake.  · You or your family members are worried for your safety.  This information is not intended to replace advice given to you by your health care  provider. Make sure you discuss any questions you have with your health care provider.  Document Released: 01/19/2008 Document Revised: 07/14/2015 Document Reviewed: 11/03/2014  Elsevier Interactive Patient Education © 2019 [employer]

## 2018-03-21 ENCOUNTER — Inpatient Hospital Stay (HOSPITAL_COMMUNITY)
Admission: EM | Admit: 2018-03-21 | Discharge: 2018-03-26 | DRG: 682 | Disposition: A | Discharge status: Skilled Nursing Facility | Payer: Medicare Other | Attending: Internal Medicine | Admitting: Internal Medicine

## 2018-03-21 ENCOUNTER — Encounter (HOSPITAL_COMMUNITY): Payer: Self-pay | Admitting: Emergency Medicine

## 2018-03-21 ENCOUNTER — Inpatient Hospital Stay (HOSPITAL_COMMUNITY): Payer: Medicare Other

## 2018-03-21 ENCOUNTER — Other Ambulatory Visit: Payer: Self-pay

## 2018-03-21 ENCOUNTER — Emergency Department (HOSPITAL_COMMUNITY): Payer: Medicare Other

## 2018-03-21 DIAGNOSIS — N183 Chronic kidney disease, stage 3 unspecified: Secondary | ICD-10-CM | POA: Diagnosis present

## 2018-03-21 DIAGNOSIS — I10 Essential (primary) hypertension: Secondary | ICD-10-CM | POA: Diagnosis not present

## 2018-03-21 DIAGNOSIS — R4182 Altered mental status, unspecified: Secondary | ICD-10-CM | POA: Diagnosis not present

## 2018-03-21 DIAGNOSIS — Z79890 Hormone replacement therapy (postmenopausal): Secondary | ICD-10-CM | POA: Diagnosis not present

## 2018-03-21 DIAGNOSIS — E782 Mixed hyperlipidemia: Secondary | ICD-10-CM | POA: Diagnosis present

## 2018-03-21 DIAGNOSIS — Z7951 Long term (current) use of inhaled steroids: Secondary | ICD-10-CM | POA: Diagnosis not present

## 2018-03-21 DIAGNOSIS — N3000 Acute cystitis without hematuria: Secondary | ICD-10-CM

## 2018-03-21 DIAGNOSIS — E876 Hypokalemia: Secondary | ICD-10-CM | POA: Diagnosis present

## 2018-03-21 DIAGNOSIS — R296 Repeated falls: Secondary | ICD-10-CM | POA: Diagnosis present

## 2018-03-21 DIAGNOSIS — F05 Delirium due to known physiological condition: Secondary | ICD-10-CM | POA: Diagnosis present

## 2018-03-21 DIAGNOSIS — N390 Urinary tract infection, site not specified: Secondary | ICD-10-CM | POA: Diagnosis not present

## 2018-03-21 DIAGNOSIS — K219 Gastro-esophageal reflux disease without esophagitis: Secondary | ICD-10-CM | POA: Diagnosis present

## 2018-03-21 DIAGNOSIS — G9341 Metabolic encephalopathy: Secondary | ICD-10-CM | POA: Diagnosis present

## 2018-03-21 DIAGNOSIS — Z90710 Acquired absence of both cervix and uterus: Secondary | ICD-10-CM

## 2018-03-21 DIAGNOSIS — F0390 Unspecified dementia without behavioral disturbance: Secondary | ICD-10-CM | POA: Diagnosis present

## 2018-03-21 DIAGNOSIS — I129 Hypertensive chronic kidney disease with stage 1 through stage 4 chronic kidney disease, or unspecified chronic kidney disease: Secondary | ICD-10-CM | POA: Diagnosis not present

## 2018-03-21 DIAGNOSIS — Z7952 Long term (current) use of systemic steroids: Secondary | ICD-10-CM

## 2018-03-21 DIAGNOSIS — E860 Dehydration: Secondary | ICD-10-CM | POA: Diagnosis not present

## 2018-03-21 DIAGNOSIS — I959 Hypotension, unspecified: Secondary | ICD-10-CM | POA: Diagnosis not present

## 2018-03-21 DIAGNOSIS — F419 Anxiety disorder, unspecified: Secondary | ICD-10-CM

## 2018-03-21 DIAGNOSIS — R410 Disorientation, unspecified: Secondary | ICD-10-CM | POA: Diagnosis not present

## 2018-03-21 DIAGNOSIS — Z7982 Long term (current) use of aspirin: Secondary | ICD-10-CM

## 2018-03-21 DIAGNOSIS — Z79899 Other long term (current) drug therapy: Secondary | ICD-10-CM | POA: Diagnosis not present

## 2018-03-21 DIAGNOSIS — R262 Difficulty in walking, not elsewhere classified: Secondary | ICD-10-CM | POA: Diagnosis not present

## 2018-03-21 DIAGNOSIS — W19XXXA Unspecified fall, initial encounter: Secondary | ICD-10-CM | POA: Diagnosis not present

## 2018-03-21 DIAGNOSIS — R531 Weakness: Secondary | ICD-10-CM

## 2018-03-21 DIAGNOSIS — Z743 Need for continuous supervision: Secondary | ICD-10-CM | POA: Diagnosis not present

## 2018-03-21 DIAGNOSIS — E039 Hypothyroidism, unspecified: Secondary | ICD-10-CM | POA: Diagnosis present

## 2018-03-21 DIAGNOSIS — R5381 Other malaise: Secondary | ICD-10-CM | POA: Diagnosis not present

## 2018-03-21 DIAGNOSIS — Z9181 History of falling: Secondary | ICD-10-CM | POA: Diagnosis not present

## 2018-03-21 DIAGNOSIS — R279 Unspecified lack of coordination: Secondary | ICD-10-CM | POA: Diagnosis not present

## 2018-03-21 DIAGNOSIS — N179 Acute kidney failure, unspecified: Secondary | ICD-10-CM | POA: Diagnosis present

## 2018-03-21 DIAGNOSIS — M6281 Muscle weakness (generalized): Secondary | ICD-10-CM | POA: Diagnosis not present

## 2018-03-21 DIAGNOSIS — R140 Abdominal distension (gaseous): Secondary | ICD-10-CM | POA: Diagnosis not present

## 2018-03-21 DIAGNOSIS — S0990XA Unspecified injury of head, initial encounter: Secondary | ICD-10-CM | POA: Diagnosis not present

## 2018-03-21 DIAGNOSIS — B9689 Other specified bacterial agents as the cause of diseases classified elsewhere: Secondary | ICD-10-CM | POA: Diagnosis not present

## 2018-03-21 DIAGNOSIS — R2689 Other abnormalities of gait and mobility: Secondary | ICD-10-CM | POA: Diagnosis not present

## 2018-03-21 LAB — CBC WITH DIFFERENTIAL/PLATELET
Abs Immature Granulocytes: 0.1 10*3/uL — ABNORMAL HIGH (ref 0.00–0.07)
BASOS ABS: 0 10*3/uL (ref 0.0–0.1)
Basophils Relative: 0 %
Eosinophils Absolute: 0 10*3/uL (ref 0.0–0.5)
Eosinophils Relative: 0 %
HCT: 38.6 % (ref 36.0–46.0)
Hemoglobin: 12.5 g/dL (ref 12.0–15.0)
Immature Granulocytes: 1 %
Lymphocytes Relative: 7 %
Lymphs Abs: 1.2 10*3/uL (ref 0.7–4.0)
MCH: 28.9 pg (ref 26.0–34.0)
MCHC: 32.4 g/dL (ref 30.0–36.0)
MCV: 89.4 fL (ref 80.0–100.0)
Monocytes Absolute: 1.9 10*3/uL — ABNORMAL HIGH (ref 0.1–1.0)
Monocytes Relative: 11 %
Neutro Abs: 13.5 10*3/uL — ABNORMAL HIGH (ref 1.7–7.7)
Neutrophils Relative %: 81 %
PLATELETS: 314 10*3/uL (ref 150–400)
RBC: 4.32 MIL/uL (ref 3.87–5.11)
RDW: 13.5 % (ref 11.5–15.5)
WBC: 16.7 10*3/uL — AB (ref 4.0–10.5)
nRBC: 0 % (ref 0.0–0.2)

## 2018-03-21 LAB — MAGNESIUM: MAGNESIUM: 2 mg/dL (ref 1.7–2.4)

## 2018-03-21 LAB — COMPREHENSIVE METABOLIC PANEL
ALT: 14 U/L (ref 0–44)
AST: 18 U/L (ref 15–41)
Albumin: 4.3 g/dL (ref 3.5–5.0)
Alkaline Phosphatase: 51 U/L (ref 38–126)
Anion gap: 12 (ref 5–15)
BUN: 44 mg/dL — ABNORMAL HIGH (ref 8–23)
CO2: 24 mmol/L (ref 22–32)
CREATININE: 1.53 mg/dL — AB (ref 0.44–1.00)
Calcium: 8.9 mg/dL (ref 8.9–10.3)
Chloride: 103 mmol/L (ref 98–111)
GFR calc Af Amer: 34 mL/min — ABNORMAL LOW (ref >60)
GFR, EST NON AFRICAN AMERICAN: 29 mL/min — AB (ref >60)
Glucose, Bld: 91 mg/dL (ref 70–99)
Potassium: 2.8 mmol/L — ABNORMAL LOW (ref 3.5–5.1)
Sodium: 139 mmol/L (ref 135–145)
Total Bilirubin: 1.1 mg/dL (ref 0.3–1.2)
Total Protein: 6.8 g/dL (ref 6.5–8.1)

## 2018-03-21 LAB — URINALYSIS, ROUTINE W REFLEX MICROSCOPIC
Bilirubin Urine: NEGATIVE
Glucose, UA: NEGATIVE mg/dL
HGB URINE DIPSTICK: NEGATIVE
Ketones, ur: 5 mg/dL — AB
Nitrite: NEGATIVE
Protein, ur: NEGATIVE mg/dL
Specific Gravity, Urine: 1.023 (ref 1.005–1.030)
WBC, UA: >50 WBC/hpf — ABNORMAL HIGH (ref 0–5)
pH: 5 (ref 5.0–8.0)

## 2018-03-21 LAB — PHOSPHORUS: Phosphorus: 3.6 mg/dL (ref 2.5–4.6)

## 2018-03-21 LAB — CK: Total CK: 50 U/L (ref 38–234)

## 2018-03-21 LAB — VITAMIN B12: VITAMIN B 12: 384 pg/mL (ref 180–914)

## 2018-03-21 LAB — CREATININE, URINE, RANDOM: Creatinine, Urine: 64.53 mg/dL

## 2018-03-21 LAB — SODIUM, URINE, RANDOM: Sodium, Ur: 90 mmol/L

## 2018-03-21 MED ORDER — LEVOTHYROXINE SODIUM 50 MCG PO TABS
50.0000 ug | ORAL_TABLET | Freq: Every day | ORAL | Status: DC
  Administered 2018-03-22 – 2018-03-26 (×5): 50 ug via ORAL
  Filled 2018-03-21 (×5): qty 1

## 2018-03-21 MED ORDER — SODIUM CHLORIDE 0.9 % IV SOLN
INTRAVENOUS | Status: AC
  Administered 2018-03-21: 22:00:00 via INTRAVENOUS

## 2018-03-21 MED ORDER — ATORVASTATIN CALCIUM 20 MG PO TABS
20.0000 mg | ORAL_TABLET | Freq: Every day | ORAL | Status: DC
  Administered 2018-03-22 – 2018-03-25 (×4): 20 mg via ORAL
  Filled 2018-03-21 (×4): qty 1

## 2018-03-21 MED ORDER — ONDANSETRON HCL 4 MG PO TABS: 4.0000 mg | ORAL_TABLET | Freq: Four times a day (QID) | ORAL | Status: DC | PRN

## 2018-03-21 MED ORDER — SERTRALINE HCL 50 MG PO TABS
50.0000 mg | ORAL_TABLET | Freq: Every day | ORAL | Status: DC
  Administered 2018-03-22 – 2018-03-26 (×5): 50 mg via ORAL
  Filled 2018-03-21 (×5): qty 1

## 2018-03-21 MED ORDER — ACETAMINOPHEN 325 MG PO TABS
650.0000 mg | ORAL_TABLET | Freq: Four times a day (QID) | ORAL | Status: DC | PRN
  Administered 2018-03-22 – 2018-03-25 (×8): 650 mg via ORAL
  Filled 2018-03-21 (×8): qty 2

## 2018-03-21 MED ORDER — ACETAMINOPHEN 650 MG RE SUPP: 650.0000 mg | Freq: Four times a day (QID) | RECTAL | Status: DC | PRN

## 2018-03-21 MED ORDER — POTASSIUM CHLORIDE CRYS ER 20 MEQ PO TBCR
60.0000 meq | EXTENDED_RELEASE_TABLET | Freq: Once | ORAL | Status: AC
  Administered 2018-03-21: 60 meq via ORAL
  Filled 2018-03-21: qty 3

## 2018-03-21 MED ORDER — SODIUM CHLORIDE 0.9 % IV SOLN
INTRAVENOUS | Status: DC | PRN
  Administered 2018-03-21: 500 mL via INTRAVENOUS
  Administered 2018-03-22: 1000 mL via INTRAVENOUS

## 2018-03-21 MED ORDER — ENOXAPARIN SODIUM 30 MG/0.3ML ~~LOC~~ SOLN
30.0000 mg | SUBCUTANEOUS | Status: DC
  Administered 2018-03-21 – 2018-03-25 (×5): 30 mg via SUBCUTANEOUS
  Filled 2018-03-21 (×5): qty 0.3

## 2018-03-21 MED ORDER — POTASSIUM CHLORIDE 10 MEQ/100ML IV SOLN
10.0000 meq | Freq: Once | INTRAVENOUS | Status: AC
  Administered 2018-03-21: 10 meq via INTRAVENOUS
  Filled 2018-03-21: qty 100

## 2018-03-21 MED ORDER — ASPIRIN 81 MG PO CHEW
81.0000 mg | CHEWABLE_TABLET | Freq: Every day | ORAL | Status: DC
  Administered 2018-03-22 – 2018-03-26 (×5): 81 mg via ORAL
  Filled 2018-03-21 (×5): qty 1

## 2018-03-21 MED ORDER — SODIUM CHLORIDE 0.9 % IV SOLN
1.0000 g | INTRAVENOUS | Status: DC
  Administered 2018-03-22 – 2018-03-23 (×2): 1 g via INTRAVENOUS
  Filled 2018-03-21: qty 10
  Filled 2018-03-21: qty 1
  Filled 2018-03-21: qty 10

## 2018-03-21 MED ORDER — SODIUM CHLORIDE 0.9 % IV SOLN
1.0000 g | Freq: Once | INTRAVENOUS | Status: AC
  Administered 2018-03-21: 1 g via INTRAVENOUS
  Filled 2018-03-21: qty 10

## 2018-03-21 MED ORDER — ONDANSETRON HCL 4 MG/2ML IJ SOLN: 4.0000 mg | Freq: Four times a day (QID) | INTRAMUSCULAR | Status: DC | PRN
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
## 2018-03-21 NOTE — ED Triage Notes (Signed)
Pt brought in by daughter, pt c/o feeling dehydrated, weakness and problems with urination. Pt with difficulty urinating over the past day, then new problems with incontinence last night. Pt also with fall sometime between 8pm last night and this afternoon injuring back. Pt states she does have hx of arthritis in back.

## 2018-03-21 NOTE — ED Notes (Signed)
Urine culture sent with sample. 

## 2018-03-21 NOTE — H&P (Signed)
Deborah Rivers YDX:[id number] DOB: 07-18-1924 DOA: 03/21/2018     PCP: Dettinger, Fransisca Kaufmann, MD   Outpatient Specialists:   NONE    Patient arrived to ER on 03/21/18 at 1346  Patient coming from: home Lives alone,       Chief Complaint:  Chief Complaint  Patient presents with  . Weakness  . Dysuria  . Fall    HPI: Deborah Rivers is a 85 y.o. female with medical history significant of HTN, HLD    Presented with   worsening confusion over the past few weeks Per family patient have been unsafe at home leaving the stove on burning things in the microwave and son.  She has had frequent falls over the past few days.  Has been more confused.  Apparently she went to the bank and attempted to withdrawal her money.  Been having difficulty with urination for the past 1 day and became incontinent of urine recently. Fall was yesterday and she injured her back.  Have been having some back pain since. Reports decreased PO intake Chest pain no cough no fevers no chills  Regarding pertinent Chronic problems: no hx of MI or CVA History of hypertension on Norvasc  history of high lipids on Lipitor   While in ER: To have acute renal failure  and UTI As well as hypokalemia The following Work up has been ordered so far:  Orders Placed This Encounter  Procedures  . Urine culture  . CT Head Wo Contrast  . Urinalysis, Routine w reflex microscopic- may I&O cath if menses  . Comprehensive metabolic panel  . CBC with Differential  . Initiate Carrier Fluid Protocol  . Consult to hospitalist    Following Medications were ordered in ER: Medications  0.9 %  sodium chloride infusion (500 mLs Intravenous New Bag/Given 03/21/18 1657)  potassium chloride SA (K-DUR,KLOR-CON) CR tablet 60 mEq (has no administration in time range)  potassium chloride 10 mEq in 100 mL IVPB (has no administration in time range)  cefTRIAXone (ROCEPHIN) 1 g in sodium chloride 0.9 % 100 mL IVPB (1 g Intravenous New  Bag/Given 03/21/18 1659)    Significant initial  Findings: Abnormal Labs Reviewed  URINALYSIS, ROUTINE W REFLEX MICROSCOPIC - Abnormal; Notable for the following components:      Result Value   Color, Urine AMBER (*)    APPearance HAZY (*)    Ketones, ur 5 (*)    Leukocytes, UA MODERATE (*)    WBC, UA >50 (*)    Bacteria, UA MANY (*)    All other components within normal limits  COMPREHENSIVE METABOLIC PANEL - Abnormal; Notable for the following components:   Potassium 2.8 (*)    BUN 44 (*)    Creatinine, Ser 1.53 (*)    GFR calc non Af Amer 29 (*)    GFR calc Af Amer 34 (*)    All other components within normal limits  CBC WITH DIFFERENTIAL/PLATELET - Abnormal; Notable for the following components:   WBC 16.7 (*)    Neutro Abs 13.5 (*)    Monocytes Absolute 1.9 (*)    Abs Immature Granulocytes 0.10 (*)    All other components within normal limits     Lactic Acid, Venous    Component Value Date/Time   LATICACIDVEN 1.09 11/20/2015 0025    Na 139 K 2.8  Cr Up from baseline see below Lab Results  Component Value Date   CREATININE 1.53 (H) 03/21/2018   CREATININE 1.47 (  H) 03/03/2018   CREATININE 1.14 (H) 11/04/2017      WBC 16.7  HG/HCT stable     Component Value Date/Time   HGB 12.5 03/21/2018 1701   HGB 11.3 03/03/2018 1538   HCT 38.6 03/21/2018 1701   HCT 33.3 (L) 03/03/2018 1538    Troponin (Point of Care Test) No results for input(s): TROPIPOC in the last 72 hours.  BNP (last 3 results) No results for input(s): BNP in the last 8760 hours.  ProBNP (last 3 results) No results for input(s): PROBNP in the last 8760 hours.   UA evidence of UTI  >50 bacteria many   CT HEAD  NON acute   ECG:  Personally reviewed by me showing: HR : 75 Rhythm:  NSR, w PAC   no evidence of ischemic changes QTC  429     ED Triage Vitals [03/21/18 1413]  Enc Vitals Group     BP (!) 144/72     Pulse Rate 85     Resp 18     Temp 98.7 F (37.1 C)     Temp  Source Oral     SpO2 95 %     Weight      Height      Head Circumference      Peak Flow      Pain Score      Pain Loc      Pain Edu?      Excl. in [place]?   WCBJ(62)@       Latest  Blood pressure (!) 159/90, pulse 90, temperature 98.7 F (37.1 C), temperature source Oral, resp. rate 18, SpO2 100 %.      Hospitalist was called for admission for dehydration and acute renal failure and UTI   Review of Systems:    Pertinent positives include: chills, fatigue, confusion  Constitutional:  No weight loss, night sweats, Fevers, weight loss  HEENT:  No headaches, Difficulty swallowing,Tooth/dental problems,Sore throat,  No sneezing, itching, ear ache, nasal congestion, post nasal drip,  Cardio-vascular:  No chest pain, Orthopnea, PND, anasarca, dizziness, palpitations.no Bilateral lower extremity swelling  GI:  No heartburn, indigestion, abdominal pain, nausea, vomiting, diarrhea, change in bowel habits, loss of appetite, melena, blood in stool, hematemesis Resp:  no shortness of breath at rest. No dyspnea on exertion, No excess mucus, no productive cough, No non-productive cough, No coughing up of blood.No change in color of mucus.No wheezing. Skin:  no rash or lesions. No jaundice GU:  no dysuria, change in color of urine, no urgency or frequency. No straining to urinate.  No flank pain.  Musculoskeletal:  No joint pain or no joint swelling. No decreased range of motion. No back pain.  Psych:  No change in mood or affect. No depression or anxiety. No memory loss.  Neuro: no localizing neurological complaints, no tingling, no weakness, no double vision, no gait abnormality, no slurred speech, no   All systems reviewed and apart from [place] all are negative  Past Medical History:   Past Medical History:  Diagnosis Date  . Allergy   . Anemia   . Anxiety   . Cataract   . Depression   . Diverticulitis   . Essential hypertension   . GERD (gastroesophageal reflux disease)   .  Hyperlipidemia   . Hypothyroidism   . Recurrent UTI       Past Surgical History:  Procedure Laterality Date  . APPENDECTOMY    . EYE SURGERY    . HERNIA  REPAIR    . TOTAL VAGINAL HYSTERECTOMY      Social History:  Ambulatory walker     reports that she has never smoked. She has never used smokeless tobacco. She reports that she does not drink alcohol or use drugs.     Family History:  Family History  Problem Relation Age of Onset  . Stroke Mother   . Miscarriages / [place] Mother   . Hyperlipidemia Mother   . Hypertension Mother   . Heart attack Father   . Hyperlipidemia Father   . Hypertension Father   . Hearing loss Father   . Miscarriages / Stillbirths Sister   . Heart attack Maternal Grandmother   . Heart attack Maternal Grandfather   . Heart attack Paternal Grandmother   . Heart attack Paternal Grandfather   . Diabetes Other   . Heart disease Other   . Kidney disease Other   . Heart attack Brother   . Cancer Sister     Allergies: Allergies  Allergen Reactions  . Sulfa Antibiotics Nausea Only, Anxiety and Rash     Prior to Admission medications   Medication Sig Start Date End Date Taking? Authorizing Provider  acetaminophen (TYLENOL) 325 MG tablet Take 2 tablets (650 mg total) by mouth every 6 (six) hours as needed for mild pain (or Fever >/= 101). 11/21/15  Yes Elgergawy, Silver Huguenin, MD  amLODipine (NORVASC) 5 MG tablet Take 1 tablet (5 mg total) by mouth daily. 11/04/17  Yes Dettinger, Fransisca Kaufmann, MD  aspirin 81 MG tablet Take 1 tablet (81 mg total) by mouth daily. 03/19/18  Yes Dettinger, Fransisca Kaufmann, MD  atorvastatin (LIPITOR) 20 MG tablet Take 1 tablet (20 mg total) by mouth daily at 6 PM. 11/04/17  Yes Dettinger, Fransisca Kaufmann, MD  ergocalciferol (VITAMIN D2) 50000 UNITS capsule Take 50,000 Units by mouth once a week. Mon   Yes [provider]  fluticasone (FLONASE) 50 MCG/ACT nasal spray Place 2 sprays into both nostrils daily. Patient taking  differently: Place 2 sprays into both nostrils daily as needed for allergies.  08/02/17  Yes Timmothy Euler, MD  LORazepam (ATIVAN) 0.5 MG tablet TAKE 1/2 (ONE-HALF) TABLET BY MOUTH AT BEDTIME 11/04/17  Yes Dettinger, Fransisca Kaufmann, MD  megestrol (MEGACE) 20 MG tablet Take 20 mg by mouth daily. 03/17/18  Yes [provider]  Multiple Vitamins-Minerals (CENTRUM SILVER PO) Take 1 tablet by mouth daily.   Yes [provider]  sertraline (ZOLOFT) 50 MG tablet Take 1 tablet (50 mg total) by mouth daily. 12/26/17  Yes Dettinger, Fransisca Kaufmann, MD  SYNTHROID 50 MCG tablet Take 1 tablet (50 mcg total) by mouth daily. 11/04/17  Yes Dettinger, Fransisca Kaufmann, MD  predniSONE (DELTASONE) 10 MG tablet Take 10 mg by mouth See admin instructions. prednisone 10 mg tablet take 1 tab three times a day for 2 days, 1 tab twice a day for 5 days, 1 tab daily till finished    [provider]  predniSONE (DELTASONE) 10 MG tablet Take 10 mg by mouth See admin instructions. 03/13/18   [provider]   Physical Exam: Blood pressure (!) 159/90, pulse 90, temperature 98.7 F (37.1 C), temperature source Oral, resp. rate 18, SpO2 100 %. 1. General:  in No Acute distress   well -appearing 2. Psychological: Alert and   Oriented to self and situation 3. Head/ENT:     Dry Mucous Membranes  Head Non traumatic, neck supple                            Poor Dentition 4. SKIN:   decreased Skin turgor,  Skin clean Dry and intact no rash 5. Heart: Regular rate and rhythm no  Murmur, no Rub or gallop 6. Lungs Clear to auscultation bilaterally, no wheezes or crackles   7. Abdomen: Soft,  non-tender,      distended  bowel sounds present 8. Lower extremities: no clubbing, cyanosis, no edema 9. Neurologically   strength 5 out of 5 in all 4 extremities cranial nerves II through XII intact 10. MSK: Normal range of motion   LABS:     Recent Labs  Lab 03/21/18 1701  WBC 16.7*  NEUTROABS  13.5*  HGB 12.5  HCT 38.6  MCV 89.4  PLT 623   Basic Metabolic Panel: Recent Labs  Lab 03/21/18 1701  NA 139  K 2.8*  CL 103  CO2 24  GLUCOSE 91  BUN 44*  CREATININE 1.53*  CALCIUM 8.9      Recent Labs  Lab 03/21/18 1701  AST 18  ALT 14  ALKPHOS 51  BILITOT 1.1  PROT 6.8  ALBUMIN 4.3   No results for input(s): LIPASE, AMYLASE in the last 168 hours. No results for input(s): AMMONIA in the last 168 hours.    HbA1C: No results for input(s): HGBA1C in the last 72 hours. CBG: No results for input(s): GLUCAP in the last 168 hours.    Urine analysis:    Component Value Date/Time   COLORURINE AMBER (A) 03/21/2018 1415   APPEARANCEUR HAZY (A) 03/21/2018 1415   APPEARANCEUR Clear 12/03/2017 1547   LABSPEC 1.023 03/21/2018 1415   PHURINE 5.0 03/21/2018 1415   GLUCOSEU NEGATIVE 03/21/2018 1415   HGBUR NEGATIVE 03/21/2018 1415   BILIRUBINUR NEGATIVE 03/21/2018 1415   BILIRUBINUR Negative 12/03/2017 1547   KETONESUR 5 (A) 03/21/2018 1415   PROTEINUR NEGATIVE 03/21/2018 1415   UROBILINOGEN 1.0 06/16/2014 1825   NITRITE NEGATIVE 03/21/2018 1415   LEUKOCYTESUR MODERATE (A) 03/21/2018 1415   LEUKOCYTESUR Negative 12/03/2017 1547     Cultures:    Component Value Date/Time   SDES BLOOD RIGHT FOREARM 11/20/2015 0016   SDES BLOOD LEFT ANTECUBITAL 11/20/2015 0016   SPECREQUEST IN PEDIATRIC BOTTLE 3 CC 11/20/2015 0016   SPECREQUEST IN PEDIATRIC BOTTLE 3 CC 11/20/2015 0016   CULT  11/20/2015 0016    NO GROWTH 5 DAYS Performed at [facility]  11/20/2015 0016    NO GROWTH 5 DAYS Performed at [facility] 11/25/2015 FINAL 11/20/2015 0016   REPTSTATUS 11/25/2015 FINAL 11/20/2015 0016     Radiological Exams on Admission: Ct Head Wo Contrast  Result Date: 03/21/2018 CLINICAL DATA:  pt c/o feeling dehydrated, weakness and problems with urination. Pt with difficulty urinating over the past day, then new problems with incontinence  last night. Pt also with fall sometime between 8pm last night and this afternoon injuring back. EXAM: CT HEAD WITHOUT CONTRAST TECHNIQUE: Contiguous axial images were obtained from the base of the skull through the vertex without intravenous contrast. COMPARISON:  04/08/2015 FINDINGS: Brain: No evidence of acute infarction, hemorrhage, hydrocephalus, extra-axial collection or mass lesion/mass effect. There is ventricular and sulcal enlargement reflecting moderate diffuse atrophy. Patchy white matter hypoattenuation is also present consistent with moderate chronic microvascular ischemic change. Vascular: No hyperdense vessel or unexpected calcification.  Skull: Normal. Negative for fracture or focal lesion. Sinuses/Orbits: Globes and orbits are unremarkable. Sinuses are clear. Other: None. IMPRESSION: 1. No acute intracranial abnormalities. 2. Atrophy and chronic microvascular ischemic change. Electronically Signed   By: Lajean Manes M.D.   On: 03/21/2018 19:07    Chart has been reviewed    Assessment/Plan   85 y.o. female with medical history significant of HTN, HLD  Admitted for dehydration and acute renal failure and UTI    Present on Admission: . Acute metabolic encephalopathy -   - most likely multifactorial secondary to combination of  infection  mild dehydration secondary to decreased by mouth intake, use of benzos  - Will rehydrate   - treat underlining infection   - Hold contributing medications need to clarify how long patient has been on Ativan this could have contributed to her decline Hold off for tonight and see if patient will benefit from alternative agents   - if no improvement may need further imaging to evaluate for CNS pathology pathology such as MRI of the brain   - neurological exam appears to be non focal    - no history of liver disease  We will check TSH and B12 level  . Acute lower UTI -  - treat with Rocephin        await results of urine culture and adjust  antibiotic coverage as needed  . Acute renal failure (ARF) (HCC) - - likely secondary to dehydration,       check FeNA       Rehydrate with IV fluids     . Essential hypertension -given dehydration allow permissive hypertension for tonight resume when able to tolerate . Mixed hyperlipidemia stable continue home medications . Dementia ([place]) expect some degree of sundowning while hospitalized currently appears to be pleasant alert and oriented will likely need short-term rehab no expectation to return to the baseline . CKD (chronic kidney disease), stage III (HCC) avoid nephrotoxic medications  Hypokalemia - - will replace and repeat in AM,  check magnesium level and replace as needed  Abdominal distention given hypokalemia we will obtain KUB to evaluate for severe obstipation as this can be worsened by electrolyte abnormalities  Other plan as per orders.  DVT prophylaxis:  SCD    Code Status:  FULL CODE    as per patient   I had personally discussed CODE STATUS with patient   Family Communication:   Family not  at  Bedside    Disposition Plan:    likely will need placement for rehabilitation                                            Would benefit from PT/OT eval prior to DC  Ordered                   Swallow eval - SLP ordered                                                Consults called: none    Admission status:    inpatient     Expect 2 midnight stay secondary to severity of patient's current illness including      Severe lab/radiological/exam abnormalities including:  Elevated  cr and low potassium   and extensive comorbidities including:  dementia   That are currently affecting medical management.   I expect  patient to be hospitalized for 2 midnights requiring inpatient medical care.  Patient is at high risk for adverse outcome (such as loss of life or disability) if not treated.  Indication for inpatient stay as follows:  Severe change from baseline  regarding mental status      Need for   IV fluids, IV medications IV potassium given severity of hyperkalemia      Level of care    tele  For 12H     Rhiannan Kievit 03/21/2018, 8:02 PM    [facility]     after 2 AM please page floor coverage PA If 7AM-7PM, please contact the day team taking care of the patient using [email or web address]

## 2018-03-22 DIAGNOSIS — N3000 Acute cystitis without hematuria: Secondary | ICD-10-CM

## 2018-03-22 DIAGNOSIS — R140 Abdominal distension (gaseous): Secondary | ICD-10-CM

## 2018-03-22 DIAGNOSIS — N390 Urinary tract infection, site not specified: Secondary | ICD-10-CM

## 2018-03-22 LAB — COMPREHENSIVE METABOLIC PANEL
ALK PHOS: 41 U/L (ref 38–126)
ALT: 11 U/L (ref 0–44)
AST: 13 U/L — ABNORMAL LOW (ref 15–41)
Albumin: 3.6 g/dL (ref 3.5–5.0)
Anion gap: 6 (ref 5–15)
BUN: 35 mg/dL — ABNORMAL HIGH (ref 8–23)
CO2: 24 mmol/L (ref 22–32)
Calcium: 8.6 mg/dL — ABNORMAL LOW (ref 8.9–10.3)
Chloride: 111 mmol/L (ref 98–111)
Creatinine, Ser: 1.11 mg/dL — ABNORMAL HIGH (ref 0.44–1.00)
GFR calc Af Amer: 50 mL/min — ABNORMAL LOW (ref >60)
GFR calc non Af Amer: 43 mL/min — ABNORMAL LOW (ref >60)
Glucose, Bld: 88 mg/dL (ref 70–99)
Potassium: 4.1 mmol/L (ref 3.5–5.1)
Sodium: 141 mmol/L (ref 135–145)
Total Bilirubin: 1 mg/dL (ref 0.3–1.2)
Total Protein: 5.9 g/dL — ABNORMAL LOW (ref 6.5–8.1)

## 2018-03-22 LAB — CBC
HCT: 35.5 % — ABNORMAL LOW (ref 36.0–46.0)
Hemoglobin: 11.4 g/dL — ABNORMAL LOW (ref 12.0–15.0)
MCH: 29.3 pg (ref 26.0–34.0)
MCHC: 32.1 g/dL (ref 30.0–36.0)
MCV: 91.3 fL (ref 80.0–100.0)
Platelets: 281 10*3/uL (ref 150–400)
RBC: 3.89 MIL/uL (ref 3.87–5.11)
RDW: 13.7 % (ref 11.5–15.5)
WBC: 15.1 10*3/uL — ABNORMAL HIGH (ref 4.0–10.5)
nRBC: 0 % (ref 0.0–0.2)

## 2018-03-22 LAB — MAGNESIUM: Magnesium: 1.9 mg/dL (ref 1.7–2.4)

## 2018-03-22 LAB — PHOSPHORUS: Phosphorus: 2.4 mg/dL — ABNORMAL LOW (ref 2.5–4.6)

## 2018-03-22 LAB — TSH: TSH: 0.79 u[IU]/mL (ref 0.350–4.500)

## 2018-03-22 MED ORDER — LORAZEPAM 0.5 MG PO TABS
0.5000 mg | ORAL_TABLET | Freq: Every day | ORAL | Status: DC | PRN
  Administered 2018-03-22 – 2018-03-25 (×4): 0.5 mg via ORAL
  Filled 2018-03-22 (×4): qty 1

## 2018-03-22 MED ORDER — SODIUM CHLORIDE 0.9 % IV SOLN
INTRAVENOUS | Status: DC
  Administered 2018-03-23: 02:00:00 via INTRAVENOUS

## 2018-03-22 MED ORDER — AMLODIPINE BESYLATE 5 MG PO TABS
5.0000 mg | ORAL_TABLET | Freq: Every day | ORAL | Status: DC
  Administered 2018-03-22 – 2018-03-26 (×5): 5 mg via ORAL
  Filled 2018-03-22 (×5): qty 1

## 2018-03-22 MED ORDER — MEGESTROL ACETATE 400 MG/10ML PO SUSP
800.0000 mg | Freq: Every day | ORAL | Status: DC
  Administered 2018-03-23 – 2018-03-26 (×4): 800 mg via ORAL
  Filled 2018-03-22 (×4): qty 20

## 2018-03-22 MED ORDER — HYDRALAZINE HCL 20 MG/ML IJ SOLN: 5.0000 mg | Freq: Four times a day (QID) | INTRAMUSCULAR | Status: DC | PRN

## 2018-03-22 MED ORDER — MEGESTROL ACETATE 20 MG PO TABS
20.0000 mg | ORAL_TABLET | Freq: Every day | ORAL | Status: DC
  Administered 2018-03-22: 20 mg via ORAL
  Filled 2018-03-22: qty 1

## 2018-03-22 NOTE — Evaluation (Signed)
Physical Therapy Evaluation Patient Details Name: Deborah Rivers MRN: [id number] DOB: [birth year] Today's Date: 03/22/2018   History of Present Illness  85 y.o. female with medical history significant of HTN, HLD and admitted with confusion, found to have acute renal failure and UTI  Clinical Impression  Pt admitted with above diagnosis. Pt currently with functional limitations due to the deficits listed below (see PT Problem List).  Pt will benefit from skilled PT to increase their independence and safety with mobility to allow discharge to the venue listed below.  Pt agreeable to mobilize and reports back pain from last night (however currently improved since receiving tylenol).  Pt assisted up to recliner for breakfast (arrived at start of session).  Pt requiring min assist currently for mobility.  Pt from home alone and per pt, does not typically use assistive device (however uncertain of accuracy as pt appears still a little confused).  Recommend 24/7 assist for safety upon d/c; pt would benefit from SNF if family is unable to provide assist.     Follow Up Recommendations SNF;Supervision/Assistance - 24 hour    Equipment Recommendations  Rolling walker with 5" wheels    Recommendations for Other Services       Precautions / Restrictions Precautions Precautions: Fall      Mobility  Bed Mobility Overal bed mobility: Needs Assistance Bed Mobility: Supine to Sit     Supine to sit: Min assist     General bed mobility comments: assist for manual cues, multimodal cues provided  Transfers Overall transfer level: Needs assistance Equipment used: 1 person hand held assist Transfers: Sit to/from [facility] Sit to Stand: Min assist Stand pivot transfers: Min assist       General transfer comment: verbal cues for hand placement, pt declined use of RW however needed UE support to transfer, utilized armrests and provided HHA  Ambulation/Gait                 Stairs            Wheelchair Mobility    Modified Rankin (Stroke Patients Only)       Balance Overall balance assessment: Needs assistance;History of Falls         Standing balance support: Bilateral upper extremity supported Standing balance-Leahy Scale: Poor Standing balance comment: requires UE support                             Pertinent Vitals/Pain Pain Assessment: No/denies pain(reports back pain earlier however tylenol helped)    Home Living Family/patient expects to be discharged to:: Private residence Living Arrangements: Alone           Home Layout: One level Home Equipment: None      Prior Function Level of Independence: Independent         Comments: pt poor historian, per chart, from home alone, pt denies using assistive device     Hand Dominance        Extremity/Trunk Assessment        Lower Extremity Assessment Lower Extremity Assessment: Generalized weakness    Cervical / Trunk Assessment Cervical / Trunk Assessment: Kyphotic  Communication   Communication: HOH  Cognition Arousal/Alertness: Awake/alert Behavior During Therapy: WFL for tasks assessed/performed Overall Cognitive Status: No family/caregiver present to determine baseline cognitive functioning  General Comments: pt appears slightly confused but able to follow commands, pleasant and cooperative      General Comments      Exercises     Assessment/Plan    PT Assessment Patient needs continued PT services  PT Problem List Decreased strength;Decreased safety awareness;Decreased mobility;Decreased balance;Decreased knowledge of use of DME;Decreased activity tolerance       PT Treatment Interventions DME instruction;Functional mobility training;Balance training;Patient/family education;Gait training;Therapeutic activities;Neuromuscular re-education;Stair training;Therapeutic exercise    PT Goals  (Current goals can be found in the Care Plan section)  Acute Rehab PT Goals PT Goal Formulation: With patient Time For Goal Achievement: 04/05/18 Potential to Achieve Goals: Good    Frequency Min 2X/week   Barriers to discharge        Co-evaluation               AM-PAC PT "6 Clicks" Mobility  Outcome Measure Help needed turning from your back to your side while in a flat bed without using bedrails?: A Little Help needed moving from lying on your back to sitting on the side of a flat bed without using bedrails?: A Little Help needed moving to and from a bed to a chair (including a wheelchair)?: A Little Help needed standing up from a chair using your arms (e.g., wheelchair or bedside chair)?: A Little Help needed to walk in hospital room?: A Little Help needed climbing 3-5 steps with a railing? : A Lot 6 Click Score: 17    End of Session   Activity Tolerance: Patient tolerated treatment well Patient left: in chair;with call bell/phone within reach;with chair alarm set Nurse Communication: Mobility status PT Visit Diagnosis: Unsteadiness on feet (R26.81);Difficulty in walking, not elsewhere classified (R26.2)    Time: 1030-1045 PT Time Calculation (min) (ACUTE ONLY): 15 min   Charges:   PT Evaluation $PT Eval Low Complexity: [place], PT, DPT Acute Rehabilitation Services Office: [phone] Pager: [phone]  [name] 03/22/2018, 11:38 AM

## 2018-03-22 NOTE — NC FL2 (Signed)
[place] LEVEL OF CARE SCREENING TOOL     IDENTIFICATION  Patient Name: Deborah Rivers Birthdate: 1924-11-23 Sex: female Admission Date (Current Location): 03/21/2018  [facility] [place] Number:  Herbalist and Address:  [facility],  [address], [place]      Provider Number: [id number]  Attending Physician Name and Address:  Georgette Shell, MD  Relative Name and Phone Number:  Deborah Rivers: [phone]    Current Level of Care: Hospital Recommended Level of Care: [place] Prior Approval Number:    Date Approved/Denied:   PASRR Number: Pending  Discharge Plan: SNF    Current Diagnoses: Patient Active Problem List   Diagnosis Date Noted  . Abdominal distension   . Acute cystitis without hematuria   . Acute renal failure (ARF) ([place]) 03/21/2018  . Acute metabolic encephalopathy 12/75/1700  . Acute lower UTI 03/21/2018  . Hypokalemia 03/21/2018  . Leg cramp 08/10/2017  . Fatigue 08/10/2017  . Hypothyroidism   . Recurrent UTI   . GERD (gastroesophageal reflux disease)   . Depression   . Cataract   . Anxiety   . Abnormal CT scan, colon 12/16/2015  . Mood disorder ([place]) 11/20/2015  . Dementia ([place]) 11/20/2015  . CKD (chronic kidney disease), stage III ([place]) 11/20/2015  . Aortic calcification ([place]) 02/26/2014  . Essential hypertension 02/26/2014  . Mixed hyperlipidemia 02/26/2014  . Anemia 07/20/2013    Orientation RESPIRATION BLADDER Height & Weight     Self  Normal External catheter Weight: 105 lb 9.6 oz (47.9 kg) Height:  5\' 3"  (160 cm)  BEHAVIORAL SYMPTOMS/MOOD NEUROLOGICAL BOWEL NUTRITION STATUS      Continent Diet(Regular)  AMBULATORY STATUS COMMUNICATION OF NEEDS Skin   Limited Assist Verbally Normal                       Personal Care Assistance Level of Assistance  Bathing, Feeding, Dressing Bathing Assistance: Limited assistance Feeding assistance: Limited  assistance Dressing Assistance: Limited assistance     Functional Limitations Info  Sight, Hearing, Speech Sight Info: Adequate Hearing Info: Adequate Speech Info: Adequate    SPECIAL CARE FACTORS FREQUENCY  PT (By licensed PT), OT (By licensed OT)     PT Frequency: 5x/week OT Frequency: 5x/week            Contractures Contractures Info: Not present    Additional Factors Info  Code Status, Allergies, Psychotropic Code Status Info: Full Allergies Info: SULFA ANTIBIOTICS  Psychotropic Info: Zofran         Current Medications (03/22/2018):  This is the current hospital active medication list Current Facility-Administered Medications  Medication Dose Route Frequency Provider Last Rate Last Dose  . 0.9 %  sodium chloride infusion   Intravenous PRN Toy Baker, MD 10 mL/hr at 03/22/18 0953 1,000 mL at 03/22/18 0953  . 0.9 %  sodium chloride infusion   Intravenous Continuous Georgette Shell, MD 75 mL/hr at 03/22/18 1228    . acetaminophen (TYLENOL) tablet 650 mg  650 mg Oral Q6H PRN Toy Baker, MD   650 mg at 03/22/18 0951   Or  . acetaminophen (TYLENOL) suppository 650 mg  650 mg Rectal Q6H PRN Doutova, Anastassia, MD      . amLODipine (NORVASC) tablet 5 mg  5 mg Oral Daily Georgette Shell, MD   5 mg at 03/22/18 1224  . aspirin chewable tablet 81 mg  81 mg Oral Daily Doutova, Anastassia,  MD   81 mg at 03/22/18 0949  . atorvastatin (LIPITOR) tablet 20 mg  20 mg Oral q1800 Doutova, Anastassia, MD      . cefTRIAXone (ROCEPHIN) 1 g in sodium chloride 0.9 % 100 mL IVPB  1 g Intravenous Q24H Doutova, Anastassia, MD      . enoxaparin (LOVENOX) injection 30 mg  30 mg Subcutaneous Q24H Doutova, Anastassia, MD   30 mg at 03/21/18 2208  . hydrALAZINE (APRESOLINE) injection 5 mg  5 mg Intravenous Q6H PRN Georgette Shell, MD      . levothyroxine (SYNTHROID, LEVOTHROID) tablet 50 mcg  50 mcg Oral Daily Toy Baker, MD   50 mcg at 03/22/18 0517  .  megestrol (MEGACE) tablet 20 mg  20 mg Oral Daily Georgette Shell, MD   20 mg at 03/22/18 1224  . ondansetron (ZOFRAN) tablet 4 mg  4 mg Oral Q6H PRN Toy Baker, MD       Or  . ondansetron (ZOFRAN) injection 4 mg  4 mg Intravenous Q6H PRN Doutova, Anastassia, MD      . sertraline (ZOLOFT) tablet 50 mg  50 mg Oral Daily Doutova, Anastassia, MD   50 mg at 03/22/18 9326     Discharge Medications: Please see discharge summary for a list of discharge medications.  Relevant Imaging Results:  Relevant Lab Results:   Additional Information SSN: [id number]  Pricilla Holm, [place]

## 2018-03-22 NOTE — Progress Notes (Signed)
OT Cancellation Note  Patient Details Name: Deborah Rivers MRN: [id number] DOB: 08-09-1924   Cancelled Treatment:    Reason Eval/Treat Not Completed: Fatigue/lethargy limiting ability to participate.  Pt up in chair for several hours per RN and now back to bed sleeping soundly. Will check back on pt as schedule allows.  Kari Baars, OT Acute Rehabilitation Services [phone] Office- [phone], Thereasa Parkin 03/22/2018, 4:13 PM

## 2018-03-22 NOTE — Progress Notes (Signed)
Pt denies pain, pleasant resting in bed with family at bedside. Pt sat in chair today for about three hours and tol well. Voiding, BP improved.  Pt ate 90- 100% of all meals and tol well. Will cont to monitor. SRP, RN
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
## 2018-03-22 NOTE — Evaluation (Signed)
Clinical/Bedside Swallow Evaluation Patient Details  Name: Deborah Rivers MRN: [id number] Date of Birth: 04/03/1924  Today's Date: 03/22/2018 Time: SLP Start Time (ACUTE ONLY): 54 SLP Stop Time (ACUTE ONLY): 1746 SLP Time Calculation (min) (ACUTE ONLY): 18 min  Past Medical History:  Past Medical History:  Diagnosis Date  . Allergy   . Anemia   . Anxiety   . Cataract   . Depression   . Diverticulitis   . Essential hypertension   . GERD (gastroesophageal reflux disease)   . Hyperlipidemia   . Hypothyroidism   . Recurrent UTI    Past Surgical History:  Past Surgical History:  Procedure Laterality Date  . APPENDECTOMY    . EYE SURGERY    . HERNIA REPAIR    . TOTAL VAGINAL HYSTERECTOMY     HPI:   85 y.o. female with medical history significant of HTN, HLD  Admitted for dehydration and acute renal failure and UTI   Assessment / Plan / Recommendation Clinical Impression  Patient presents with oropharyngeal swallow which appears at bedside to be within functional limits with adequate airway protection. She denies history of dysphagia. She is able to self-feed without assistance. Oral stage characterized by adequate mastication, appearance of good oral control and there is no residue or pocketing. She consumed. No overt signs of aspiration observed despite challenging with consecutive straw sips of thin liquids in excess of 3oz. Recommend regular diet with thin liquids, meds whole with liquid. No further skilled ST needs identified. Will s/o.   SLP Visit Diagnosis: Dysphagia, unspecified (R13.10)    Aspiration Risk  No limitations    Diet Recommendation Regular;Thin liquid   Liquid Administration via: Straw;Cup Medication Administration: Whole meds with liquid Supervision: Patient able to self feed Compensations: Slow rate;Small sips/bites    Other  Recommendations Oral Care Recommendations: Oral care BID   Follow up Recommendations None      Frequency and Duration             Prognosis Prognosis for Safe Diet Advancement: Good Barriers to Reach Goals: Cognitive deficits      Swallow Study   General Date of Onset: 03/21/18 HPI:  85 y.o. female with medical history significant of HTN, HLD  Admitted for dehydration and acute renal failure and UTI Type of Study: Bedside Swallow Evaluation Previous Swallow Assessment: none in chart Diet Prior to this Study: Regular;Thin liquids Temperature Spikes Noted: No Respiratory Status: Room air History of Recent Intubation: No Behavior/Cognition: Alert;Cooperative;Pleasant mood Oral Cavity Assessment: Within Functional Limits Oral Care Completed by SLP: No Oral Cavity - Dentition: Adequate natural dentition Vision: Functional for self-feeding Self-Feeding Abilities: Able to feed self Patient Positioning: Upright in bed Baseline Vocal Quality: Normal Volitional Cough: Strong Volitional Swallow: Able to elicit    Oral/Motor/Sensory Function Overall Oral Motor/Sensory Function: Within functional limits   Ice Chips Ice chips: Not tested   Thin Liquid Thin Liquid: Within functional limits Presentation: Cup;Straw;Self Fed    Nectar Thick Nectar Thick Liquid: Not tested   Honey Thick Honey Thick Liquid: Not tested   Puree Puree: Within functional limits Presentation: Spoon;Self Fed   Solid     Solid: Within functional limits Presentation: [place], [place], [place] Speech-Language Pathologist Acute Rehabilitation Services Pager: [phone] Office: [phone]  Aliene Altes 03/22/2018,6:35 PM

## 2018-03-23 LAB — BASIC METABOLIC PANEL
Anion gap: 6 (ref 5–15)
BUN: 32 mg/dL — ABNORMAL HIGH (ref 8–23)
CO2: 21 mmol/L — AB (ref 22–32)
Calcium: 8.3 mg/dL — ABNORMAL LOW (ref 8.9–10.3)
Chloride: 113 mmol/L — ABNORMAL HIGH (ref 98–111)
Creatinine, Ser: 1.11 mg/dL — ABNORMAL HIGH (ref 0.44–1.00)
GFR calc Af Amer: 50 mL/min — ABNORMAL LOW (ref >60)
GFR calc non Af Amer: 43 mL/min — ABNORMAL LOW (ref >60)
Glucose, Bld: 107 mg/dL — ABNORMAL HIGH (ref 70–99)
Potassium: 3.8 mmol/L (ref 3.5–5.1)
Sodium: 140 mmol/L (ref 135–145)

## 2018-03-23 LAB — CBC
HEMATOCRIT: 34.5 % — AB (ref 36.0–46.0)
Hemoglobin: 10.8 g/dL — ABNORMAL LOW (ref 12.0–15.0)
MCH: 29.3 pg (ref 26.0–34.0)
MCHC: 31.3 g/dL (ref 30.0–36.0)
MCV: 93.8 fL (ref 80.0–100.0)
Platelets: 262 10*3/uL (ref 150–400)
RBC: 3.68 MIL/uL — ABNORMAL LOW (ref 3.87–5.11)
RDW: 13.9 % (ref 11.5–15.5)
WBC: 13.3 10*3/uL — ABNORMAL HIGH (ref 4.0–10.5)
nRBC: 0 % (ref 0.0–0.2)

## 2018-03-23 LAB — URINE CULTURE: Culture: >=100000 — AB

## 2018-03-23 LAB — MAGNESIUM: Magnesium: 1.9 mg/dL (ref 1.7–2.4)

## 2018-03-23 MED ORDER — SENNA 8.6 MG PO TABS
1.0000 | ORAL_TABLET | Freq: Every day | ORAL | Status: DC
  Administered 2018-03-23 – 2018-03-25 (×3): 8.6 mg via ORAL
  Filled 2018-03-23 (×3): qty 1

## 2018-03-23 MED ORDER — ENSURE ENLIVE PO LIQD
237.0000 mL | Freq: Two times a day (BID) | ORAL | Status: DC
  Administered 2018-03-23 – 2018-03-26 (×5): 237 mL via ORAL

## 2018-03-23 NOTE — Progress Notes (Signed)
Initial Nutrition Assessment  INTERVENTION:   Provide Ensure Enlive po BID, each supplement provides 350 kcal and 20 grams of protein  NUTRITION DIAGNOSIS:   Inadequate oral intake related to lethargy/confusion as evidenced by per patient/family report.  GOAL:   Patient will meet greater than or equal to 90% of their needs  MONITOR:   PO intake, Supplement acceptance, Labs, Weight trends, I & O's  REASON FOR ASSESSMENT:   Malnutrition Screening Tool   ASSESSMENT:   85 y.o. female with medical history significant of HTN, HLD. Admitted for acute renal failure and UTI.  Patient currently with good appetite and eating 95-100% of meals. Pt was not eating as well PTA d/t increased confusion, pt with dementia. SLP evaluated 2/1: no s/s of aspiration, able to self feed, no reported difficulty swallowing.  Per weight records, pt has lost 7 lb since 1/13 (6% wt loss x 3 weeks, significant for time frame). Prior to this weight loss, pt's weight was stable. This weight loss is most likely a result of her increased confusion. Will order Ensure supplements.  Medications: Megace suspension daily Labs reviewed: Low Phos Mg WNL  GFR: 43  NUTRITION - FOCUSED PHYSICAL EXAM: Depletions may be more related to advanced age.    Most Recent Value  Orbital Region  No depletion  Upper Arm Region  Mild depletion  Thoracic and Lumbar Region  Unable to assess  Buccal Region  Mild depletion  Temple Region  Mild depletion  Clavicle Bone Region  Mild depletion  Clavicle and Acromion Bone Region  Moderate depletion  Scapular Bone Region  Unable to assess  Dorsal Hand  Mild depletion  Patellar Region  Unable to assess  Anterior Thigh Region  Unable to assess  Posterior Calf Region  Unable to assess  Edema (RD Assessment)  None       Diet Order:   Diet Order            Diet Heart Room service appropriate? Yes; Fluid consistency: Thin  Diet effective now              EDUCATION NEEDS:    No education needs have been identified at this time  Skin:  Skin Assessment: Reviewed RN Assessment  Last BM:  1/31  Height:   Ht Readings from Last 1 Encounters:  03/21/18 5\' 3"  (1.6 m)    Weight:   Wt Readings from Last 1 Encounters:  03/21/18 47.9 kg    Ideal Body Weight:  52.3 kg  BMI:  Body mass index is 18.71 kg/m.  Estimated Nutritional Needs:   Kcal:  1200-1400  Protein:  55-65g  Fluid:  1.5L/day   Clayton Bibles, MS, RD, LDN [place] Dietitian Pager: [phone] After Hours Pager: [phone]

## 2018-03-23 NOTE — Progress Notes (Signed)
Occupational Therapy Treatment Patient Details Name: SAMORIA FEDORKO MRN: [id number] DOB: 1924-04-05 Today's Date: 03/23/2018    History of present illness 85 y.o. female with medical history significant of HTN, HLD and admitted with confusion, found to have acute renal failure and UTI   OT comments  PATIENT WAS COOPERATIVE DURING THERAPY. PATIENT HAS DECREASED ABILITY WITH ADLS AND WITH MOBILTIY. PATIENT HAS A SHUFFELING GAIT AND REFUSED TO USE WALKER. PATIENT STATES SHE DOES NOT USE ONE AT HOME. PATIENT HAS 8 STEPS TO GET INTO HOUSE AND STATES SHE COULD NOT USE A WALKER TO GET UPTHE STEPS. PATIENT NEEDS CONTINUED THERAPY IN HOSPITAL AND SHE MIGHT NEED ST SNF TO INCREASE I AND SAFETY AT HOME AND DECREASE RISK OF FALLS.   Follow Up Recommendations  SNF;Supervision/Assistance - 24 hour    Equipment Recommendations  3 in 1 bedside commode;Tub/shower bench    Recommendations for Other Services      Precautions / Restrictions Precautions Precautions: Fall Restrictions Weight Bearing Restrictions: No       Mobility Bed Mobility                  Transfers     Transfers: Sit to/from Stand Sit to Stand: Min assist Stand pivot transfers: Min assist       General transfer comment: PATIENT WITH SUFFELING GAIT AND DID NOT WANT TO USE WALKER    Balance                                           ADL either performed or assessed with clinical judgement   ADL Overall ADL's : Needs assistance/impaired Eating/Feeding: Independent   Grooming: Wash/dry hands;Wash/dry face;Oral care;Supervision/safety   Upper Body Bathing: Set up;Sitting   Lower Body Bathing: Minimal assistance;Sit to/from stand   Upper Body Dressing : Set up;Sitting   Lower Body Dressing: Minimal assistance;Sit to/from stand   Toilet Transfer: Minimal assistance   Toileting- Clothing Manipulation and Hygiene: Minimal assistance       Functional mobility during ADLs: Minimal  assistance General ADL Comments: PATIENT IS REQUIRING INCREASED ASSIST FROM BASELINE PER PATIETN REPORT     Vision Baseline Vision/History: Wears glasses Wears Glasses: At all times Patient Visual Report: No change from baseline     Perception     Praxis      Cognition Arousal/Alertness: Awake/alert Behavior During Therapy: WFL for tasks assessed/performed Overall Cognitive Status: No family/caregiver present to determine baseline cognitive functioning                                 General Comments: PATIENT WAS COOPERATIVE WITH THERAPY        Exercises     Shoulder Instructions       General Comments      Pertinent Vitals/ Pain       Pain Assessment: No/denies pain  Home Living Family/patient expects to be discharged to:: Private residence Living Arrangements: Alone Available Help at Discharge: Family;Available PRN/intermittently Type of Home: House Home Access: Stairs to enter [employer] of Steps: 8   Home Layout: One level     Bathroom Shower/Tub: Teacher, early years/pre: Standard     Home Equipment: None   Additional Comments: PATIENT REPORTS SHE WAS ACTIVE IN THE COMUNITY      Prior Functioning/Environment Level of Independence:  Independent        Comments: unsure if patient is an accurite  historian.   Frequency  Min 2X/week        Progress Toward Goals  OT Goals(current goals can now be found in the care plan section)     Acute Rehab OT Goals Patient Stated Goal: GO HOME OT Goal Formulation: With patient Time For Goal Achievement: 04/06/18 Potential to Achieve Goals: Good ADL Goals Pt Will Perform Grooming: (P) Independently Pt Will Perform Upper Body Bathing: (P) with modified independence Pt Will Perform Lower Body Bathing: (P) with modified independence Pt Will Perform Upper Body Dressing: (P) with modified independence Pt Will Perform Lower Body Dressing: (P) with modified  independence Pt Will Transfer to Toilet: (P) with modified independence Pt Will Perform Toileting - Clothing Manipulation and hygiene: (P) with modified independence Pt Will Perform Tub/Shower Transfer: (P) with modified independence  Plan      Co-evaluation                 AM-PAC OT "6 Clicks" Daily Activity     Outcome Measure   Help from another person eating meals?: None Help from another person taking care of personal grooming?: A Little Help from another person toileting, which includes using toliet, bedpan, or urinal?: A Little Help from another person bathing (including washing, rinsing, drying)?: A Little Help from another person to put on and taking off regular upper body clothing?: A Little Help from another person to put on and taking off regular lower body clothing?: A Little 6 Click Score: 19    End of Session    OT Visit Diagnosis: Unsteadiness on feet (R26.81);Other abnormalities of gait and mobility (R26.89)   Activity Tolerance Patient tolerated treatment well   Patient Left in chair;with call bell/phone within reach   Nurse Communication (ok therapy)        Time: 0350-0938 OT Time Calculation (min): 50 min  Charges: OT General Charges $OT Visit: 1 Visit OT Evaluation $OT Eval Low Complexity: 1 Low OT Treatments $Self Care/Home Management : 1-82 mins  6 CLICKS   Verdis Koval 03/23/2018, 9:36 AM

## 2018-03-23 NOTE — Progress Notes (Signed)
PROGRESS NOTE    Deborah Rivers  LPF:[id number] DOB: 12/06/1924 DOA: 03/21/2018 PCP: Dettinger, Fransisca Kaufmann, MD    Brief Narrative:85 y.o.femalewith medical history significant of HTN, HLD   Presented withworsening confusion over the past few weeks Per family patient have been unsafe at home leaving the stove on burning things in the microwave and son. She has had frequent falls over the past few days. Has been more confused. Apparently she went to the bank and attempted to withdrawal her money.  Been having difficulty with urination for the past 1 day and became incontinent of urine recently. Fall was yesterday and she injured her back. Have been having some back pain since. Reports decreased PO intake Chest pain no cough no fevers no chills   Assessment & Plan:   Principal Problem:   Acute renal failure (ARF) (HCC) Active Problems:   Essential hypertension   Mixed hyperlipidemia   Dementia (HCC)   CKD (chronic kidney disease), stage III (HCC)   Acute metabolic encephalopathy   Acute lower UTI   Hypokalemia   Abdominal distension   Acute cystitis without hematuria  #1 acute metabolic encephalopathy secondary to urinary tract infection and mild dehydration which is improving.  #2 UTI on Rocephin-  Urine culture grew Enterobacter aerogenes sensitive to Rocephin Cipro.  Continue Rocephin for now we will switch to Cipro tomorrow.  #3 acute kidney injury on CKD stage III secondary to dehydration resolved   #4 hypertension blood pressure better after restarting Norvasc..  #5 hyperlipidemia continue home meds  #6 hypokalemia repleted resolved  Estimated body mass index is 18.71 kg/m as calculated from the following:   Height as of this encounter: 5\' 3"  (1.6 m).   Weight as of this encounter: 47.9 kg.  DVT prophylaxis: SCD Code Status: Full code Family Communication: Discussed with daughter yesterday over the phone Disposition Plan: PT recommends  SNF  Consultants: None  Procedures: None none Antimicrobials Rocephin Subjective: She is sleeping when I walked into the room I was able to wake her up easily very pleasant young lady answered all my questions appropriately denies any new complaints today Objective: Vitals:   03/22/18 1001 03/22/18 1326 03/22/18 2028 03/23/18 0518  BP: (!) 180/91 (!) 148/85 128/88 (!) 154/81  Pulse: (!) 59 78 93 70  Resp:    16  Temp: 97.8 F (36.6 C) 98.8 F (37.1 C) 97.8 F (36.6 C) 98 F (36.7 C)  TempSrc: Oral Oral Oral Oral  SpO2: 100% 100% 99% 96%  Weight:      Height:        Intake/Output Summary (Last 24 hours) at 03/23/2018 1149 Last data filed at 03/23/2018 0859 Gross per 24 hour  Intake 1860.23 ml  Output 1100 ml  Net 760.23 ml   Filed Weights   03/21/18 2151  Weight: 47.9 kg    Examination:  General exam: Appears calm and comfortable  Respiratory system: Clear to auscultation. Respiratory effort normal. Cardiovascular system: S1 & S2 heard, RRR. No JVD, murmurs, rubs, gallops or clicks. No pedal edema. Gastrointestinal system: Abdomen is nondistended, soft and nontender. No organomegaly or masses felt. Normal bowel sounds heard. Central nervous system: Alert and oriented. No focal neurological deficits. Extremities: Symmetric 5 x 5 power. Skin: No rashes, lesions or ulcers Psychiatry: Judgement and insight appear normal. Mood & affect appropriate.     Data Reviewed: I have personally reviewed following labs and imaging studies  CBC: Recent Labs  Lab 03/21/18 1701 03/22/18 0349 03/23/18 0439  WBC 16.7* 15.1* 13.3*  NEUTROABS 13.5*  --   --   HGB 12.5 11.4* 10.8*  HCT 38.6 35.5* 34.5*  MCV 89.4 91.3 93.8  PLT 314 281 381   Basic Metabolic Panel: Recent Labs  Lab 03/21/18 1701 03/22/18 0349 03/23/18 0439  NA 139 141 140  K 2.8* 4.1 3.8  CL 103 111 113*  CO2 24 24 21*  GLUCOSE 91 88 107*  BUN 44* 35* 32*  CREATININE 1.53* 1.11* 1.11*  CALCIUM 8.9 8.6*  8.3*  MG 2.0 1.9 1.9  PHOS 3.6 2.4*  --    GFR: Estimated Creatinine Clearance: 25 mL/min (A) (by C-G formula based on SCr of 1.11 mg/dL (H)). Liver Function Tests: Recent Labs  Lab 03/21/18 1701 03/22/18 0349  AST 18 13*  ALT 14 11  ALKPHOS 51 41  BILITOT 1.1 1.0  PROT 6.8 5.9*  ALBUMIN 4.3 3.6   No results for input(s): LIPASE, AMYLASE in the last 168 hours. No results for input(s): AMMONIA in the last 168 hours. Coagulation Profile: No results for input(s): INR, PROTIME in the last 168 hours. Cardiac Enzymes: Recent Labs  Lab 03/21/18 1701  CKTOTAL 50   BNP (last 3 results) No results for input(s): PROBNP in the last 8760 hours. HbA1C: No results for input(s): HGBA1C in the last 72 hours. CBG: No results for input(s): GLUCAP in the last 168 hours. Lipid Profile: No results for input(s): CHOL, HDL, LDLCALC, TRIG, CHOLHDL, LDLDIRECT in the last 72 hours. Thyroid Function Tests: Recent Labs    03/22/18 0349  TSH 0.790   Anemia Panel: Recent Labs    03/21/18 2204  VITAMINB12 384   Sepsis Labs: No results for input(s): PROCALCITON, LATICACIDVEN in the last 168 hours.  Recent Results (from the past 240 hour(s))  Urine culture     Status: Abnormal   Collection Time: 03/21/18  2:15 PM  Result Value Ref Range Status   Specimen Description   Final    URINE, RANDOM Performed at [facility] [address] [place], [place] [address]    Special Requests   Final    NONE Performed at [facility], [place] [address], [place], [place] [address]    Culture >=100,000 COLONIES/mL ENTEROBACTER AEROGENES (A)  Final   Report Status 03/23/2018 FINAL  Final   Organism ID, Bacteria ENTEROBACTER AEROGENES (A)  Final      Susceptibility   Enterobacter aerogenes - MIC*    CEFAZOLIN >=64 RESISTANT Resistant     CEFTRIAXONE <=1 SENSITIVE Sensitive     CIPROFLOXACIN <=0.25 SENSITIVE Sensitive     GENTAMICIN <=1 SENSITIVE Sensitive      IMIPENEM 1 SENSITIVE Sensitive     NITROFURANTOIN 128 RESISTANT Resistant     TRIMETH/SULFA <=20 SENSITIVE Sensitive     PIP/TAZO <=4 SENSITIVE Sensitive     * >=100,000 COLONIES/mL ENTEROBACTER AEROGENES         Radiology Studies: Dg Abd 1 View  Result Date: 03/21/2018 CLINICAL DATA:  Abdominal distention and dehydration. EXAM: ABDOMEN - 1 VIEW COMPARISON:  Lumbar spine radiographs 03/13/2018 FINDINGS: Thoracolumbar spondylosis with gentle levoconvex curvature of the upper lumbar spine is again noted and stable in appearance. Bowel gas pattern is unremarkable. No radiopaque calculi. Phleboliths are present within the lower pelvis. IMPRESSION: Unremarkable bowel gas pattern. Electronically Signed   By: Ashley Royalty M.D.   On: 03/21/2018 20:31   Ct Head Wo Contrast  Result Date: 03/21/2018 CLINICAL DATA:  pt c/o feeling dehydrated,  weakness and problems with urination. Pt with difficulty urinating over the past day, then new problems with incontinence last night. Pt also with fall sometime between 8pm last night and this afternoon injuring back. EXAM: CT HEAD WITHOUT CONTRAST TECHNIQUE: Contiguous axial images were obtained from the base of the skull through the vertex without intravenous contrast. COMPARISON:  04/08/2015 FINDINGS: Brain: No evidence of acute infarction, hemorrhage, hydrocephalus, extra-axial collection or mass lesion/mass effect. There is ventricular and sulcal enlargement reflecting moderate diffuse atrophy. Patchy white matter hypoattenuation is also present consistent with moderate chronic microvascular ischemic change. Vascular: No hyperdense vessel or unexpected calcification. Skull: Normal. Negative for fracture or focal lesion. Sinuses/Orbits: Globes and orbits are unremarkable. Sinuses are clear. Other: None. IMPRESSION: 1. No acute intracranial abnormalities. 2. Atrophy and chronic microvascular ischemic change. Electronically Signed   By: Lajean Manes M.D.   On:  03/21/2018 19:07        Scheduled Meds: . amLODipine  5 mg Oral Daily  . aspirin  81 mg Oral Daily  . atorvastatin  20 mg Oral q1800  . enoxaparin (LOVENOX) injection  30 mg Subcutaneous Q24H  . feeding supplement (ENSURE ENLIVE)  237 mL Oral BID BM  . levothyroxine  50 mcg Oral Daily  . megestrol  800 mg Oral Daily  . sertraline  50 mg Oral Daily   Continuous Infusions: . sodium chloride 75 mL/hr at 03/22/18 1307  . cefTRIAXone (ROCEPHIN)  IV 1 g (03/22/18 1728)     LOS: 2 days     Georgette Shell, MD [facility]  If 7PM-7AM, please contact night-coverage [email or web address] Password [facility] 03/23/2018, 11:49 AM

## 2018-03-24 LAB — FOLATE RBC
Folate, Hemolysate: 376 ng/mL
Folate, RBC: 1116 ng/mL (ref >498)
Hematocrit: 33.7 % — ABNORMAL LOW (ref 34.0–46.6)

## 2018-03-24 LAB — VITAMIN B1: VITAMIN B1 (THIAMINE): 127.4 nmol/L (ref 66.5–200.0)

## 2018-03-24 MED ORDER — HYDROCORTISONE 1 % EX CREA
TOPICAL_CREAM | Freq: Three times a day (TID) | CUTANEOUS | Status: DC | PRN
  Filled 2018-03-24: qty 28

## 2018-03-24 MED ORDER — CEFDINIR 300 MG PO CAPS
300.0000 mg | ORAL_CAPSULE | Freq: Every day | ORAL | Status: DC
  Administered 2018-03-24 – 2018-03-26 (×3): 300 mg via ORAL
  Filled 2018-03-24 (×3): qty 1

## 2018-03-24 NOTE — Progress Notes (Signed)
PROGRESS NOTE    Deborah Rivers  DJM:[id number] DOB: [birth year] DOA: 03/21/2018 PCP: Dettinger, Fransisca Kaufmann, MD   Brief Narrative:85 y.o.femalewith medical history significant of HTN, HLD   Presented withworsening confusion over the past few weeks Per family patient have been unsafe at home leaving the stove on burning things in the microwave and son. She has had frequent falls over the past few days. Has been more confused. Apparently she went to the bank and attempted to withdrawal her money.  Been having difficulty with urination for the past 1 day and became incontinent of urine recently. Fall was yesterday and she injured her back. Have been having some back pain since. Reports decreased PO intake Chest pain no cough no fevers no chills  Assessment & Plan:   Principal Problem:   Acute renal failure (ARF) (HCC) Active Problems:   Essential hypertension   Mixed hyperlipidemia   Dementia (HCC)   CKD (chronic kidney disease), stage III (HCC)   Acute metabolic encephalopathy   Acute lower UTI   Hypokalemia   Abdominal distension   Acute cystitis without hematuria  #1 acute metabolic encephalopathy secondary to urinary tract infection and mild dehydration which is improving.  #2 UTI on Rocephin-  Urine culture grew Enterobacter aerogenes sensitive to Rocephin Cipro.  Continue Rocephin for now we will switch to Omnicef 300 mg daily.  #3 acute kidney injury on CKD stage III secondary to dehydration resolved   #4 hypertension blood pressure better after restarting Norvasc..  #5 hyperlipidemia continue home meds  #6 hypokalemia repleted resolved        Nutrition Problem: Inadequate oral intake Etiology: lethargy/confusion     Signs/Symptoms: per patient/family report    Interventions: Ensure Enlive (each supplement provides 350kcal and 20 grams of protein)  Estimated body mass index is 18.71 kg/m as calculated from the following:   Height as of  this encounter: 5\' 3"  (1.6 m).   Weight as of this encounter: 47.9 kg.  DVT prophylaxis: SCD Code Status: Full code Family Communication: None in the room today Disposition Plan:  Pending SNF placement Consultants: None Procedures none Antimicrobials Rocephin upon discharge will change to p.o. Omnicef 300 mg daily.  Subjective: Ambulating in hallway anxious to go home  Objective: Vitals:   03/23/18 0518 03/23/18 1504 03/23/18 2047 03/24/18 0534  BP: (!) 154/81 (!) 144/71 133/64 (!) 171/85  Pulse: 70 83 86 73  Resp: 16 20 16 18   Temp: 98 F (36.7 C) 98 F (36.7 C) 97.8 F (36.6 C) 98 F (36.7 C)  TempSrc: Oral Oral Oral   SpO2: 96%  99% 97%  Weight:      Height:        Intake/Output Summary (Last 24 hours) at 03/24/2018 1139 Last data filed at 03/24/2018 0900 Gross per 24 hour  Intake 674.98 ml  Output 800 ml  Net -125.02 ml   Filed Weights   03/21/18 2151  Weight: 47.9 kg    Examination:  General exam: Appears calm and comfortable  Respiratory system: Clear to auscultation. Respiratory effort normal. Cardiovascular system: S1 & S2 heard, RRR. No JVD, murmurs, rubs, gallops or clicks. No pedal edema. Gastrointestinal system: Abdomen is nondistended, soft and nontender. No organomegaly or masses felt. Normal bowel sounds heard. Central nervous system: Alert and oriented. No focal neurological deficits. Extremities: Symmetric 5 x 5 power. Skin: No rashes, lesions or ulcers Psychiatry: Judgement and insight appear normal. Mood & affect appropriate.     Data Reviewed: I have  personally reviewed following labs and imaging studies  CBC: Recent Labs  Lab 03/21/18 1701 03/22/18 0349 03/23/18 0439  WBC 16.7* 15.1* 13.3*  NEUTROABS 13.5*  --   --   HGB 12.5 11.4* 10.8*  HCT 38.6 35.5* 34.5*  MCV 89.4 91.3 93.8  PLT 314 281 950   Basic Metabolic Panel: Recent Labs  Lab 03/21/18 1701 03/22/18 0349 03/23/18 0439  NA 139 141 140  K 2.8* 4.1 3.8  CL 103  111 113*  CO2 24 24 21*  GLUCOSE 91 88 107*  BUN 44* 35* 32*  CREATININE 1.53* 1.11* 1.11*  CALCIUM 8.9 8.6* 8.3*  MG 2.0 1.9 1.9  PHOS 3.6 2.4*  --    GFR: Estimated Creatinine Clearance: 25 mL/min (A) (by C-G formula based on SCr of 1.11 mg/dL (H)). Liver Function Tests: Recent Labs  Lab 03/21/18 1701 03/22/18 0349  AST 18 13*  ALT 14 11  ALKPHOS 51 41  BILITOT 1.1 1.0  PROT 6.8 5.9*  ALBUMIN 4.3 3.6   No results for input(s): LIPASE, AMYLASE in the last 168 hours. No results for input(s): AMMONIA in the last 168 hours. Coagulation Profile: No results for input(s): INR, PROTIME in the last 168 hours. Cardiac Enzymes: Recent Labs  Lab 03/21/18 1701  CKTOTAL 50   BNP (last 3 results) No results for input(s): PROBNP in the last 8760 hours. HbA1C: No results for input(s): HGBA1C in the last 72 hours. CBG: No results for input(s): GLUCAP in the last 168 hours. Lipid Profile: No results for input(s): CHOL, HDL, LDLCALC, TRIG, CHOLHDL, LDLDIRECT in the last 72 hours. Thyroid Function Tests: Recent Labs    03/22/18 0349  TSH 0.790   Anemia Panel: Recent Labs    03/21/18 2204  VITAMINB12 384   Sepsis Labs: No results for input(s): PROCALCITON, LATICACIDVEN in the last 168 hours.  Recent Results (from the past 240 hour(s))  Urine culture     Status: Abnormal   Collection Time: 03/21/18  2:15 PM  Result Value Ref Range Status   Specimen Description   Final    URINE, RANDOM Performed at [facility] [address], [place], [place] [address]    Special Requests   Final    NONE Performed at [facility], [place] [address], [place], [place] [address]    Culture >=100,000 COLONIES/mL ENTEROBACTER AEROGENES (A)  Final   Report Status 03/23/2018 FINAL  Final   Organism ID, Bacteria ENTEROBACTER AEROGENES (A)  Final      Susceptibility   Enterobacter aerogenes - MIC*    CEFAZOLIN >=64 RESISTANT Resistant     CEFTRIAXONE <=1  SENSITIVE Sensitive     CIPROFLOXACIN <=0.25 SENSITIVE Sensitive     GENTAMICIN <=1 SENSITIVE Sensitive     IMIPENEM 1 SENSITIVE Sensitive     NITROFURANTOIN 128 RESISTANT Resistant     TRIMETH/SULFA <=20 SENSITIVE Sensitive     PIP/TAZO <=4 SENSITIVE Sensitive     * >=100,000 COLONIES/mL ENTEROBACTER AEROGENES         Radiology Studies: No results found.      Scheduled Meds: . amLODipine  5 mg Oral Daily  . aspirin  81 mg Oral Daily  . atorvastatin  20 mg Oral q1800  . enoxaparin (LOVENOX) injection  30 mg Subcutaneous Q24H  . feeding supplement (ENSURE ENLIVE)  237 mL Oral BID BM  . levothyroxine  50 mcg Oral Daily  . megestrol  800 mg Oral Daily  . senna  1  tablet Oral QHS  . sertraline  50 mg Oral Daily   Continuous Infusions: . sodium chloride 75 mL/hr at 03/23/18 0204  . cefTRIAXone (ROCEPHIN)  IV 1 g (03/23/18 1621)     LOS: 3 days     Georgette Shell, MD [facility] If 7PM-7AM, please contact night-coverage [email or web address] Password [facility] 03/24/2018, 11:39 AM

## 2018-03-24 NOTE — Progress Notes (Signed)
Physical Therapy Treatment Patient Details Name: Deborah Rivers MRN: [id number] DOB: Dec 10, 1924 Today's Date: 03/24/2018    History of Present Illness 85 y.o. female with medical history significant of HTN, HLD and admitted with confusion, found to have acute renal failure and UTI    PT Comments    Pt ambulated in hallway and required some steadying assist and utilized RW for support.  Pt does not typically use RW at baseline and required safety cues.   Continue to recommend SNF upon d/c.    Follow Up Recommendations  SNF;Supervision/Assistance - 24 hour     Equipment Recommendations  Rolling walker with 5" wheels    Recommendations for Other Services       Precautions / Restrictions Precautions Precautions: Fall    Mobility  Bed Mobility Overal bed mobility: Needs Assistance Bed Mobility: Supine to Sit     Supine to sit: Min assist     General bed mobility comments: multimodal cues provided, assist for trunk upright  Transfers Overall transfer level: Needs assistance Equipment used: Rolling walker (2 wheeled) Transfers: Sit to/from Stand Sit to Stand: Min assist         General transfer comment: cues for hand placement, assist to steady with rise  Ambulation/Gait Ambulation/Gait assistance: Min assist;Min guard Gait Distance (Feet): 160 Feet Assistive device: Rolling walker (2 wheeled) Gait Pattern/deviations: Step-through pattern;Decreased stride length;Trunk flexed     General Gait Details: assist for occasional steadying, verbal cues for safe use of RW including positioning, posture   Stairs             Wheelchair Mobility    Modified Rankin (Stroke Patients Only)       Balance Overall balance assessment: History of Falls;Needs assistance         Standing balance support: Bilateral upper extremity supported Standing balance-Leahy Scale: Poor Standing balance comment: requires UE support                             Cognition Arousal/Alertness: Awake/alert Behavior During Therapy: WFL for tasks assessed/performed                                   General Comments: pt asking repetitive questions, per RN pt was attempting to call a cab this morning      Exercises      General Comments        Pertinent Vitals/Pain Pain Assessment: No/denies pain    Home Living                      Prior Function            PT Goals (current goals can now be found in the care plan section) Progress towards PT goals: Progressing toward goals    Frequency    Min 2X/week      PT Plan Current plan remains appropriate    Co-evaluation              AM-PAC PT "6 Clicks" Mobility   Outcome Measure  Help needed turning from your back to your side while in a flat bed without using bedrails?: A Little Help needed moving from lying on your back to sitting on the side of a flat bed without using bedrails?: A Little Help needed moving to and from a bed to a chair (including a wheelchair)?: A  Little Help needed standing up from a chair using your arms (e.g., wheelchair or bedside chair)?: A Little Help needed to walk in hospital room?: A Little Help needed climbing 3-5 steps with a railing? : A Lot 6 Click Score: 17    End of Session Equipment Utilized During Treatment: Gait belt Activity Tolerance: Patient tolerated treatment well Patient left: in chair;with call bell/phone within reach;with chair alarm set;with family/visitor present Nurse Communication: Mobility status PT Visit Diagnosis: Unsteadiness on feet (R26.81);Difficulty in walking, not elsewhere classified (R26.2)     Time: 1792-1783 PT Time Calculation (min) (ACUTE ONLY): 24 min  Charges:  $Gait Training: 23-37 mins                     [name], PT, DPT Acute Rehabilitation Services Office: [phone] Pager: [phone]  [name] 03/24/2018, 12:48 PM

## 2018-03-24 NOTE — Progress Notes (Signed)
Patient agreeable to SNF placement for rehab. Daughter and patient chose [facility] SNF. [place] authorization process started.

## 2018-03-24 NOTE — Clinical Social Work Note (Signed)
Clinical Social Work Assessment  Patient Details  Name: TANIA STEINHAUSER MRN: [id number] Date of Birth: Jun 09, 1924  Date of referral:  03/24/18               Reason for consult:  Discharge Planning                Permission sought to share information with:  Facility Sport and exercise psychologist, Family Supports Permission granted to share information::  Yes, Verbal Permission Granted  Name::       Solicitor::   [place]   Relationship::   Daughter   Contact Information:    [phone]  Housing/Transportation Living arrangements for the past 2 months:  [place] of Information:  Adult Children Patient Interpreter Needed:  None Criminal Activity/Legal Involvement Pertinent to Current Situation/Hospitalization:  No - Comment as needed Significant Relationships:  Adult Children Lives with:  Self Do you feel safe going back to the place where you live?  No Need for family participation in patient care:  Yes (Comment)  Care giving concerns:     Presented with worsening confusion over the past few weeks Per family patient have been unsafe at home leaving the stove on burning things in the microwave.  She has had frequent falls over the past few days.  Has been more confused.  Apparently she went to the bank and attempted to withdrawal her money. Been having difficulty with urination for the past 1 day and became incontinent of urine recently. Fall was yesterday and she injured her back.  Have been having some back pain since  Facilities manager / plan: CSW discussed the discharge plan with the patient daughter. She reports she is concern about the patient living at home alone. She reports the patient has fallen several times in her home and does not feel it is safe for the patient to return. The daughter reports she has been discussing long term care at ALF with the patient and other family members. CSW explain SNF process and ALF process. CSW  provided a list of SNF's to patient daughter for review.    Plan: SNF for rehab. PTAR to transport.   Employment status:  Retired Nurse, adult PT Recommendations:  [place] / Referral to community resources:  [place]  Patient/Family's Response to care:  Daughter appreciative of CSW assistance with rehab placement.   Patient/Family's Understanding of and Emotional Response to Diagnosis, Current Treatment, and Prognosis: Patient family understands the patient "needs more support" and has been proactive in starting the patient transition process into a ALF.      Emotional Assessment Appearance:  Appears stated age Attitude/Demeanor/Rapport:    Affect (typically observed):  Accepting Orientation:  Oriented to Self Alcohol / Substance use:  Not Applicable Psych involvement (Current and /or in the community):  No (Comment)  Discharge Needs  Concerns to be addressed:  Discharge Planning Concerns Readmission within the last 30 days:  No Current discharge risk:  Dependent with Mobility Barriers to Discharge:  Ship broker, [place] ([place])   Lia Hopping, [place] 03/24/2018, 10:33 AM

## 2018-03-24 NOTE — Consult Note (Signed)
   [facility] CM Inpatient Consult   03/24/2018  LIVIAN VANDERBECK 1924/05/21 [id number]    Patient screened for potential [facility] Care Management needs. Chart reviewed. Noted discharge plans are for SNF. No identifiable [facility] Care Management needs at this time.   Marthenia Rolling, MSN-Ed, RN,BSN [facility] Liaison [phone]

## 2018-03-24 NOTE — Care Management Important Message (Signed)
Important Message  Patient Details  Name: ICIS BUDREAU MRN: [id number] Date of Birth: [birth year]   Medicare Important Message Given:  Yes    Kerin Salen 03/24/2018, 11:58 [place] Message  Patient Details  Name: KARISTA AISPURO MRN: [id number] Date of Birth: 11/23/1924   Medicare Important Message Given:  Yes    Kerin Salen 03/24/2018, 11:58 AM

## 2018-03-25 ENCOUNTER — Ambulatory Visit: Payer: Medicare Other | Admitting: Family Medicine

## 2018-03-25 MED ORDER — CEFDINIR 300 MG PO CAPS: 300.0000 mg | ORAL_CAPSULE | Freq: Every day | ORAL | 0 refills | Status: DC

## 2018-03-25 MED ORDER — LORAZEPAM 0.5 MG PO TABS: ORAL_TABLET | ORAL | 0 refills | Status: DC

## 2018-03-25 MED ORDER — HYDROCORTISONE 1 % EX CREA: TOPICAL_CREAM | Freq: Three times a day (TID) | CUTANEOUS | 0 refills | Status: DC | PRN

## 2018-03-25 NOTE — Progress Notes (Signed)
Insurance Authorization still pending at this time. CSW notified the patient daughter.   Kathrin Greathouse, Marlinda Mike, MSW Clinical Social Worker  [phone] 03/25/2018  3:36 PM

## 2018-03-25 NOTE — Discharge Summary (Signed)
Physician Discharge Summary  Deborah Rivers ION:[id number] DOB: 17-May-1924 DOA: 03/21/2018  PCP: Dettinger, Fransisca Kaufmann, MD  Admit date: 03/21/2018 Discharge date: 03/25/2018  Admitted From:home Disposition:snf  Recommendations for Outpatient Follow-up:  1. Follow up with PCP in 1-2 weeks 2. Please obtain BMP/CBC in one week  Home Health none Equipment/Devices:none  Discharge Condition stable  CODE STATUS full Diet recommendation:cardiac Brief/Interim Summary:85 y.o.femalewith medical history significant of HTN, HLD   Presented withworsening confusion over the past few weeks Per family patient have been unsafe at home leaving the stove on burning things in the microwave and son. She has had frequent falls over the past few days. Has been more confused. Apparently she went to the bank and attempted to withdrawal her money.  Been having difficulty with urination for the past 1 day and became incontinent of urine recently. Fall was yesterday and she injured her back. Have been having some back pain since. Reports decreased PO intake Chest pain no cough no fevers no chills   Discharge Diagnoses:  Principal Problem:   Acute renal failure (ARF) (HCC) Active Problems:   Essential hypertension   Mixed hyperlipidemia   Dementia (HCC)   CKD (chronic kidney disease), stage III (HCC)   Acute metabolic encephalopathy   Acute lower UTI   Hypokalemia   Abdominal distension   Acute cystitis without hematuria  #1 acute metabolic encephalopathy secondary to urinary tract infection and mild dehydration-improved   #2 UTI on Rocephin-Urine culture grew Enterobacter aerogenes sensitive to Rocephin. omnicef for 3 more days.  #3 acute kidney injury on CKD stage III secondary to dehydrationresolved  #4 hypertensionblood pressure better after restarting Norvasc..  #5 hyperlipidemia continue home meds  #6 hypokalemia repleted resolved    Nutrition Problem: Inadequate  oral intake Etiology: lethargy/confusion    Signs/Symptoms: per patient/family report     Interventions: Ensure Enlive (each supplement provides 350kcal and 20 grams of protein)  Estimated body mass index is 18.71 kg/m as calculated from the following:   Height as of this encounter: 5\' 3"  (1.6 m).   Weight as of this encounter: 47.9 kg.  Discharge Instructions  Discharge Instructions    Call MD for:  difficulty breathing, headache or visual disturbances   Complete by:  As directed    Call MD for:  temperature >100.4   Complete by:  As directed    Diet - low sodium heart healthy   Complete by:  As directed    Increase activity slowly   Complete by:  As directed      Allergies as of 03/25/2018      Reactions   Sulfa Antibiotics Nausea Only, Anxiety, Rash      Medication List    STOP taking these medications   acetaminophen 325 MG tablet Commonly known as:  TYLENOL   predniSONE 10 MG tablet Commonly known as:  DELTASONE     TAKE these medications   amLODipine 5 MG tablet Commonly known as:  NORVASC Take 1 tablet (5 mg total) by mouth daily.   aspirin 81 MG tablet Take 1 tablet (81 mg total) by mouth daily.   atorvastatin 20 MG tablet Commonly known as:  LIPITOR Take 1 tablet (20 mg total) by mouth daily at 6 PM.   cefdinir 300 MG capsule Commonly known as:  OMNICEF Take 1 capsule (300 mg total) by mouth daily.   CENTRUM SILVER PO Take 1 tablet by mouth daily.   ergocalciferol 1.25 MG (50000 UT) capsule Commonly known as:  VITAMIN D2 Take 50,000 Units by mouth once a week. Mon   fluticasone 50 MCG/ACT nasal spray Commonly known as:  FLONASE Place 2 sprays into both nostrils daily. What changed:    when to take this  reasons to take this   hydrocortisone cream 1 % Apply topically 3 (three) times daily as needed for itching.   LORazepam 0.5 MG tablet Commonly known as:  ATIVAN TAKE 1/2 (ONE-HALF) TABLET BY MOUTH AT BEDTIME   megestrol 20 MG  tablet Commonly known as:  MEGACE Take 20 mg by mouth daily.   sertraline 50 MG tablet Commonly known as:  ZOLOFT Take 1 tablet (50 mg total) by mouth daily.   SYNTHROID 50 MCG tablet Generic drug:  levothyroxine Take 1 tablet (50 mcg total) by mouth daily.       Contact information for follow-up providers    Dettinger, Fransisca Kaufmann, MD Follow up.   Specialties:  Family Medicine, Cardiology Contact information: [place] [address] [phone]        Lorretta Harp, MD .   Specialties:  Cardiology, Radiology Contact information: [address] [place] [address] [phone]            Contact information for after-discharge care    Destination    [facility] .   Service:  Skilled Nursing Contact information: [address] [place] [phone]                 Allergies  Allergen Reactions  . Sulfa Antibiotics Nausea Only, Anxiety and Rash    Consultations:  none   Procedures/Studies: Dg Abd 1 View  Result Date: 03/21/2018 CLINICAL DATA:  Abdominal distention and dehydration. EXAM: ABDOMEN - 1 VIEW COMPARISON:  Lumbar spine radiographs 03/13/2018 FINDINGS: Thoracolumbar spondylosis with gentle levoconvex curvature of the upper lumbar spine is again noted and stable in appearance. Bowel gas pattern is unremarkable. No radiopaque calculi. Phleboliths are present within the lower pelvis. IMPRESSION: Unremarkable bowel gas pattern. Electronically Signed   By: Ashley Royalty M.D.   On: 03/21/2018 20:31   Ct Head Wo Contrast  Result Date: 03/21/2018 CLINICAL DATA:  pt c/o feeling dehydrated, weakness and problems with urination. Pt with difficulty urinating over the past day, then new problems with incontinence last night. Pt also with fall sometime between 8pm last night and this afternoon injuring back. EXAM: CT HEAD WITHOUT CONTRAST  TECHNIQUE: Contiguous axial images were obtained from the base of the skull through the vertex without intravenous contrast. COMPARISON:  04/08/2015 FINDINGS: Brain: No evidence of acute infarction, hemorrhage, hydrocephalus, extra-axial collection or mass lesion/mass effect. There is ventricular and sulcal enlargement reflecting moderate diffuse atrophy. Patchy white matter hypoattenuation is also present consistent with moderate chronic microvascular ischemic change. Vascular: No hyperdense vessel or unexpected calcification. Skull: Normal. Negative for fracture or focal lesion. Sinuses/Orbits: Globes and orbits are unremarkable. Sinuses are clear. Other: None. IMPRESSION: 1. No acute intracranial abnormalities. 2. Atrophy and chronic microvascular ischemic change. Electronically Signed   By: Lajean Manes M.D.   On: 03/21/2018 19:07    (Echo, Carotid, EGD, Colonoscopy, ERCP)    Subjective:  resting in bed uneventful night  Discharge Exam: Vitals:   03/24/18 2027 03/25/18 0417  BP: 134/66 (!) 166/93  Pulse: 87 95  Resp: 18 18  Temp: 98.4 F (36.9 C) 97.8 F (36.6 C)  SpO2: 98% 100%   Vitals:   03/24/18  0534 03/24/18 1400 03/24/18 2027 03/25/18 0417  BP: (!) 171/85 139/81 134/66 (!) 166/93  Pulse: 73 77 87 95  Resp: 18 18 18 18   Temp: 98 F (36.7 C) 98.1 F (36.7 C) 98.4 F (36.9 C) 97.8 F (36.6 C)  TempSrc:  Oral Oral Oral  SpO2: 97% 99% 98% 100%  Weight:      Height:        General: Pt is alert, awake, not in acute distress Cardiovascular: RRR, S1/S2 +, no rubs, no gallops Respiratory: CTA bilaterally, no wheezing, no rhonchi Abdominal: Soft, NT, ND, bowel sounds + Extremities: no edema, no cyanosis    The results of significant diagnostics from this hospitalization (including imaging, microbiology, ancillary and laboratory) are listed below for reference.     Microbiology: Recent Results (from the past 240 hour(s))  Urine culture     Status: Abnormal    Collection Time: 03/21/18  2:15 PM  Result Value Ref Range Status   Specimen Description   Final    URINE, RANDOM Performed at [facility] [address] [place], [place] [address]    Special Requests   Final    NONE Performed at [facility], [place] [address], [place], [place] [address]    Culture >=100,000 COLONIES/mL ENTEROBACTER AEROGENES (A)  Final   Report Status 03/23/2018 FINAL  Final   Organism ID, Bacteria ENTEROBACTER AEROGENES (A)  Final      Susceptibility   Enterobacter aerogenes - MIC*    CEFAZOLIN >=64 RESISTANT Resistant     CEFTRIAXONE <=1 SENSITIVE Sensitive     CIPROFLOXACIN <=0.25 SENSITIVE Sensitive     GENTAMICIN <=1 SENSITIVE Sensitive     IMIPENEM 1 SENSITIVE Sensitive     NITROFURANTOIN 128 RESISTANT Resistant     TRIMETH/SULFA <=20 SENSITIVE Sensitive     PIP/TAZO <=4 SENSITIVE Sensitive     * >=100,000 COLONIES/mL ENTEROBACTER AEROGENES     Labs: BNP (last 3 results) No results for input(s): BNP in the last 8760 hours. Basic Metabolic Panel: Recent Labs  Lab 03/21/18 1701 03/22/18 0349 03/23/18 0439  NA 139 141 140  K 2.8* 4.1 3.8  CL 103 111 113*  CO2 24 24 21*  GLUCOSE 91 88 107*  BUN 44* 35* 32*  CREATININE 1.53* 1.11* 1.11*  CALCIUM 8.9 8.6* 8.3*  MG 2.0 1.9 1.9  PHOS 3.6 2.4*  --    Liver Function Tests: Recent Labs  Lab 03/21/18 1701 03/22/18 0349  AST 18 13*  ALT 14 11  ALKPHOS 51 41  BILITOT 1.1 1.0  PROT 6.8 5.9*  ALBUMIN 4.3 3.6   No results for input(s): LIPASE, AMYLASE in the last 168 hours. No results for input(s): AMMONIA in the last 168 hours. CBC: Recent Labs  Lab 03/21/18 1701 03/22/18 0349 03/23/18 0439  WBC 16.7* 15.1* 13.3*  NEUTROABS 13.5*  --   --   HGB 12.5 11.4* 10.8*  HCT 38.6 35.5*  33.7* 34.5*  MCV 89.4 91.3 93.8  PLT 314 281 262   Cardiac Enzymes: Recent Labs  Lab 03/21/18 1701  CKTOTAL 50   BNP: Invalid input(s): POCBNP CBG: No results for  input(s): GLUCAP in the last 168 hours. D-Dimer No results for input(s): DDIMER in the last 72 hours. Hgb A1c No results for input(s): HGBA1C in the last 72 hours. Lipid Profile No results for input(s): CHOL, HDL, LDLCALC, TRIG, CHOLHDL, LDLDIRECT in the last 72 hours. Thyroid function studies No results for input(s): TSH, T4TOTAL,  T3FREE, THYROIDAB in the last 72 hours.  Invalid input(s): FREET3 Anemia work up No results for input(s): VITAMINB12, FOLATE, FERRITIN, TIBC, IRON, RETICCTPCT in the last 72 hours. Urinalysis    Component Value Date/Time   COLORURINE AMBER (A) 03/21/2018 1415   APPEARANCEUR HAZY (A) 03/21/2018 1415   APPEARANCEUR Clear 12/03/2017 1547   LABSPEC 1.023 03/21/2018 1415   PHURINE 5.0 03/21/2018 1415   GLUCOSEU NEGATIVE 03/21/2018 1415   HGBUR NEGATIVE 03/21/2018 1415   BILIRUBINUR NEGATIVE 03/21/2018 1415   BILIRUBINUR Negative 12/03/2017 1547   KETONESUR 5 (A) 03/21/2018 1415   PROTEINUR NEGATIVE 03/21/2018 1415   UROBILINOGEN 1.0 06/16/2014 1825   NITRITE NEGATIVE 03/21/2018 1415   LEUKOCYTESUR MODERATE (A) 03/21/2018 1415   LEUKOCYTESUR Negative 12/03/2017 1547   Sepsis Labs Invalid input(s): PROCALCITONIN,  WBC,  LACTICIDVEN Microbiology Recent Results (from the past 240 hour(s))  Urine culture     Status: Abnormal   Collection Time: 03/21/18  2:15 PM  Result Value Ref Range Status   Specimen Description   Final    URINE, RANDOM Performed at [facility], [place] [address] [place], [place] [address]    Special Requests   Final    NONE Performed at [facility], [place] [address], [place], [place] [address]    Culture >=100,000 COLONIES/mL ENTEROBACTER AEROGENES (A)  Final   Report Status 03/23/2018 FINAL  Final   Organism ID, Bacteria ENTEROBACTER AEROGENES (A)  Final      Susceptibility   Enterobacter aerogenes - MIC*    CEFAZOLIN >=64 RESISTANT Resistant     CEFTRIAXONE <=1 SENSITIVE Sensitive      CIPROFLOXACIN <=0.25 SENSITIVE Sensitive     GENTAMICIN <=1 SENSITIVE Sensitive     IMIPENEM 1 SENSITIVE Sensitive     NITROFURANTOIN 128 RESISTANT Resistant     TRIMETH/SULFA <=20 SENSITIVE Sensitive     PIP/TAZO <=4 SENSITIVE Sensitive     * >=100,000 COLONIES/mL ENTEROBACTER AEROGENES     Time coordinating discharge:  33 minutes  SIGNED:   Georgette Shell, MD  [facility] 03/25/2018, 9:22 AM Pager   If 7PM-7AM, please contact night-coverage [email or web address] Password [email or web address]

## 2018-03-26 DIAGNOSIS — E876 Hypokalemia: Secondary | ICD-10-CM | POA: Diagnosis not present

## 2018-03-26 DIAGNOSIS — N189 Chronic kidney disease, unspecified: Secondary | ICD-10-CM | POA: Diagnosis not present

## 2018-03-26 DIAGNOSIS — N3000 Acute cystitis without hematuria: Secondary | ICD-10-CM | POA: Diagnosis not present

## 2018-03-26 DIAGNOSIS — R2689 Other abnormalities of gait and mobility: Secondary | ICD-10-CM | POA: Diagnosis not present

## 2018-03-26 DIAGNOSIS — R1032 Left lower quadrant pain: Secondary | ICD-10-CM | POA: Diagnosis not present

## 2018-03-26 DIAGNOSIS — M6281 Muscle weakness (generalized): Secondary | ICD-10-CM | POA: Diagnosis not present

## 2018-03-26 DIAGNOSIS — R262 Difficulty in walking, not elsewhere classified: Secondary | ICD-10-CM | POA: Diagnosis not present

## 2018-03-26 DIAGNOSIS — N390 Urinary tract infection, site not specified: Secondary | ICD-10-CM | POA: Diagnosis not present

## 2018-03-26 DIAGNOSIS — I959 Hypotension, unspecified: Secondary | ICD-10-CM | POA: Diagnosis not present

## 2018-03-26 DIAGNOSIS — N179 Acute kidney failure, unspecified: Secondary | ICD-10-CM | POA: Diagnosis not present

## 2018-03-26 DIAGNOSIS — Z743 Need for continuous supervision: Secondary | ICD-10-CM | POA: Diagnosis not present

## 2018-03-26 DIAGNOSIS — R410 Disorientation, unspecified: Secondary | ICD-10-CM | POA: Diagnosis not present

## 2018-03-26 DIAGNOSIS — R279 Unspecified lack of coordination: Secondary | ICD-10-CM | POA: Diagnosis not present

## 2018-03-26 DIAGNOSIS — G9341 Metabolic encephalopathy: Secondary | ICD-10-CM | POA: Diagnosis not present

## 2018-03-26 DIAGNOSIS — G9340 Encephalopathy, unspecified: Secondary | ICD-10-CM | POA: Diagnosis not present

## 2018-03-26 DIAGNOSIS — Z9181 History of falling: Secondary | ICD-10-CM | POA: Diagnosis not present

## 2018-03-26 DIAGNOSIS — I10 Essential (primary) hypertension: Secondary | ICD-10-CM | POA: Diagnosis not present

## 2018-03-26 NOTE — Progress Notes (Signed)
Pt discharged to nursing home [facility], report called, pt dressed, VS stable. Await [facility] arrival. SRP, RN

## 2018-03-26 NOTE — Clinical Social Work Placement (Signed)
Insurance authorization received at [facility].  Nurse call report to: [place]  NOTE  Date:  03/26/2018  Patient Details  Name: Deborah Rivers MRN: [id number] Date of Birth: [birth year]  Clinical Social Work is seeking post-discharge placement for this patient at the [place] level of care (*CSW will initial, date and re-position this form in  chart as items are completed):  Yes   Patient/family provided with [facility] Work Department's list of facilities offering this level of care within the geographic area requested by the patient (or if unable, by the patient's family).  Yes   Patient/family informed of their freedom to choose among providers that offer the needed level of care, that participate in Medicare, Medicaid or managed care program needed by the patient, have an available bed and are willing to accept the patient.  Yes   Patient/family informed of [facility]'s ownership interest in [facility] and [facility], as well as of the fact that they are under no obligation to receive care at these facilities.  PASRR submitted to EDS on 03/25/18     PASRR number received on 03/25/18     Existing PASRR number confirmed on       FL2 transmitted to all facilities in geographic area requested by pt/family on       FL2 transmitted to all facilities within larger geographic area on 03/26/18     Patient informed that his/her managed care company has contracts with or will negotiate with certain facilities, including the following:        Yes   Patient/family informed of bed offers received.  Patient chooses bed at     [facility]  Physician recommends and patient chooses bed at    [facility]  Patient to be transferred to   on 03/26/18.  Patient to be transferred to facility by [facility]      Patient family notified on 03/26/18 of transfer.  Name of family member notified:   Daughter-Jean     PHYSICIAN       Additional Comment:    _______________________________________________ Lia Hopping, LCSW 03/26/2018, 1:55 PM

## 2018-03-26 NOTE — Discharge Summary (Signed)
Physician Discharge Summary  Deborah Rivers OJJ:[id number] DOB: 1924-10-13 DOA: 03/21/2018  PCP: Dettinger, Fransisca Kaufmann, MD  Admit date: 03/21/2018 Discharge date: 03/26/2018  Admitted From:home Disposition:snf  Recommendations for Outpatient Follow-up:  1. Follow up with PCP in 1-2 weeks 2. Please obtain BMP/CBC in one week  03/26/2018: DC delayed due to insurance authorization. No change in her condition and stable for dc to SNF.  Home Health none Equipment/Devices:none  Discharge Condition stable  CODE STATUS full Diet recommendation:cardiac Brief/Interim Summary:85 y.o.femalewith medical history significant of HTN, HLD   Presented withworsening confusion over the past few weeks Per family patient have been unsafe at home leaving the stove on burning things in the microwave and son. She has had frequent falls over the past few days. Has been more confused. Apparently she went to the bank and attempted to withdrawal her money.  Been having difficulty with urination for the past 1 day and became incontinent of urine recently. Fall was yesterday and she injured her back. Have been having some back pain since. Reports decreased PO intake Chest pain no cough no fevers no chills   Discharge Diagnoses:  Principal Problem:   Acute renal failure (ARF) (HCC) Active Problems:   Essential hypertension   Mixed hyperlipidemia   Dementia (HCC)   CKD (chronic kidney disease), stage III (HCC)   Acute metabolic encephalopathy   Acute lower UTI   Hypokalemia   Abdominal distension   Acute cystitis without hematuria  #1 acute metabolic encephalopathy secondary to urinary tract infection and mild dehydration-improved   #2 UTI on Rocephin-Urine culture grew Enterobacter aerogenes sensitive to Rocephin. omnicef for 3 more days.  #3 acute kidney injury on CKD stage III secondary to dehydrationresolved  #4 hypertensionblood pressure better after restarting Norvasc..  #5  hyperlipidemia continue home meds  #6 hypokalemia repleted resolved    Nutrition Problem: Inadequate oral intake Etiology: lethargy/confusion    Signs/Symptoms: per patient/family report     Interventions: Ensure Enlive (each supplement provides 350kcal and 20 grams of protein)  Estimated body mass index is 18.71 kg/m as calculated from the following:   Height as of this encounter: 5\' 3"  (1.6 m).   Weight as of this encounter: 47.9 kg.  Discharge Instructions  Discharge Instructions    Call MD for:  difficulty breathing, headache or visual disturbances   Complete by:  As directed    Call MD for:  temperature >100.4   Complete by:  As directed    Diet - low sodium heart healthy   Complete by:  As directed    Increase activity slowly   Complete by:  As directed      Allergies as of 03/26/2018      Reactions   Sulfa Antibiotics Nausea Only, Anxiety, Rash      Medication List    STOP taking these medications   acetaminophen 325 MG tablet Commonly known as:  TYLENOL   predniSONE 10 MG tablet Commonly known as:  DELTASONE     TAKE these medications   amLODipine 5 MG tablet Commonly known as:  NORVASC Take 1 tablet (5 mg total) by mouth daily.   aspirin 81 MG tablet Take 1 tablet (81 mg total) by mouth daily.   atorvastatin 20 MG tablet Commonly known as:  LIPITOR Take 1 tablet (20 mg total) by mouth daily at 6 PM.   cefdinir 300 MG capsule Commonly known as:  OMNICEF Take 1 capsule (300 mg total) by mouth daily.   CENTRUM SILVER  PO Take 1 tablet by mouth daily.   ergocalciferol 1.25 MG (50000 UT) capsule Commonly known as:  VITAMIN D2 Take 50,000 Units by mouth once a week. Mon   fluticasone 50 MCG/ACT nasal spray Commonly known as:  FLONASE Place 2 sprays into both nostrils daily. What changed:    when to take this  reasons to take this   hydrocortisone cream 1 % Apply topically 3 (three) times daily as needed for itching.   LORazepam  0.5 MG tablet Commonly known as:  ATIVAN TAKE 1/2 (ONE-HALF) TABLET BY MOUTH AT BEDTIME   megestrol 20 MG tablet Commonly known as:  MEGACE Take 20 mg by mouth daily.   sertraline 50 MG tablet Commonly known as:  ZOLOFT Take 1 tablet (50 mg total) by mouth daily.   SYNTHROID 50 MCG tablet Generic drug:  levothyroxine Take 1 tablet (50 mcg total) by mouth daily.       Contact information for follow-up providers    Dettinger, Fransisca Kaufmann, MD Follow up.   Specialties:  Family Medicine, Cardiology Contact information: [place] [address] [phone]        Lorretta Harp, MD .   Specialties:  Cardiology, Radiology Contact information: [address] [place] [address] [phone]            Contact information for after-discharge care    Destination    [facility] .   Service:  Skilled Nursing Contact information: [address] [place] [phone]                 Allergies  Allergen Reactions  . Sulfa Antibiotics Nausea Only, Anxiety and Rash    Consultations:  none   Procedures/Studies: Dg Abd 1 View  Result Date: 03/21/2018 CLINICAL DATA:  Abdominal distention and dehydration. EXAM: ABDOMEN - 1 VIEW COMPARISON:  Lumbar spine radiographs 03/13/2018 FINDINGS: Thoracolumbar spondylosis with gentle levoconvex curvature of the upper lumbar spine is again noted and stable in appearance. Bowel gas pattern is unremarkable. No radiopaque calculi. Phleboliths are present within the lower pelvis. IMPRESSION: Unremarkable bowel gas pattern. Electronically Signed   By: Ashley Royalty M.D.   On: 03/21/2018 20:31   Ct Head Wo Contrast  Result Date: 03/21/2018 CLINICAL DATA:  pt c/o feeling dehydrated, weakness and problems with urination. Pt with difficulty urinating over the past day, then new problems with incontinence last night. Pt also with  fall sometime between 8pm last night and this afternoon injuring back. EXAM: CT HEAD WITHOUT CONTRAST TECHNIQUE: Contiguous axial images were obtained from the base of the skull through the vertex without intravenous contrast. COMPARISON:  04/08/2015 FINDINGS: Brain: No evidence of acute infarction, hemorrhage, hydrocephalus, extra-axial collection or mass lesion/mass effect. There is ventricular and sulcal enlargement reflecting moderate diffuse atrophy. Patchy white matter hypoattenuation is also present consistent with moderate chronic microvascular ischemic change. Vascular: No hyperdense vessel or unexpected calcification. Skull: Normal. Negative for fracture or focal lesion. Sinuses/Orbits: Globes and orbits are unremarkable. Sinuses are clear. Other: None. IMPRESSION: 1. No acute intracranial abnormalities. 2. Atrophy and chronic microvascular ischemic change. Electronically Signed   By: Lajean Manes M.D.   On: 03/21/2018 19:07   (Echo, Carotid, EGD, Colonoscopy, ERCP)    Subjective:  resting in bed uneventful night  Discharge Exam: Vitals:   03/25/18 2144 03/26/18 0513  BP: 126/66 (!) 149/88  Pulse: 82 82  Resp: 14 18  Temp:  97.6 F (36.4 C) 97.9 F (36.6 C)  SpO2: 100% 99%   Vitals:   03/25/18 0417 03/25/18 1309 03/25/18 2144 03/26/18 0513  BP: (!) 166/93 (!) 110/56 126/66 (!) 149/88  Pulse: 95 83 82 82  Resp: 18 18 14 18   Temp: 97.8 F (36.6 C) 98.3 F (36.8 C) 97.6 F (36.4 C) 97.9 F (36.6 C)  TempSrc: Oral Oral  Oral  SpO2: 100%  100% 99%  Weight:      Height:        General: Patient is alert, awake, in no acute distress.   Cardiovascular: Regular rate and rhythm with no rubs or gallops.  No JVD or thyromegaly noted.   Respiratory: Clear to auscultation bilaterally with no wheezes or rales.  Good inspiratory effort. Abdominal: Nontender nondistended with normal bowel sounds x4 quadrant.   Extremities: No edema, no cyanosis   The results of significant  diagnostics from this hospitalization (including imaging, microbiology, ancillary and laboratory) are listed below for reference.     Microbiology: Recent Results (from the past 240 hour(s))  Urine culture     Status: Abnormal   Collection Time: 03/21/18  2:15 PM  Result Value Ref Range Status   Specimen Description   Final    URINE, RANDOM Performed at [facility] [address], [place], [place] [address]    Special Requests   Final    NONE Performed at [facility], [place] [address] [place], [place] [address]    Culture >=100,000 COLONIES/mL ENTEROBACTER AEROGENES (A)  Final   Report Status 03/23/2018 FINAL  Final   Organism ID, Bacteria ENTEROBACTER AEROGENES (A)  Final      Susceptibility   Enterobacter aerogenes - MIC*    CEFAZOLIN >=64 RESISTANT Resistant     CEFTRIAXONE <=1 SENSITIVE Sensitive     CIPROFLOXACIN <=0.25 SENSITIVE Sensitive     GENTAMICIN <=1 SENSITIVE Sensitive     IMIPENEM 1 SENSITIVE Sensitive     NITROFURANTOIN 128 RESISTANT Resistant     TRIMETH/SULFA <=20 SENSITIVE Sensitive     PIP/TAZO <=4 SENSITIVE Sensitive     * >=100,000 COLONIES/mL ENTEROBACTER AEROGENES     Labs: BNP (last 3 results) No results for input(s): BNP in the last 8760 hours. Basic Metabolic Panel: Recent Labs  Lab 03/21/18 1701 03/22/18 0349 03/23/18 0439  NA 139 141 140  K 2.8* 4.1 3.8  CL 103 111 113*  CO2 24 24 21*  GLUCOSE 91 88 107*  BUN 44* 35* 32*  CREATININE 1.53* 1.11* 1.11*  CALCIUM 8.9 8.6* 8.3*  MG 2.0 1.9 1.9  PHOS 3.6 2.4*  --    Liver Function Tests: Recent Labs  Lab 03/21/18 1701 03/22/18 0349  AST 18 13*  ALT 14 11  ALKPHOS 51 41  BILITOT 1.1 1.0  PROT 6.8 5.9*  ALBUMIN 4.3 3.6   No results for input(s): LIPASE, AMYLASE in the last 168 hours. No results for input(s): AMMONIA in the last 168 hours. CBC: Recent Labs  Lab 03/21/18 1701 03/22/18 0349 03/23/18 0439  WBC 16.7* 15.1* 13.3*  NEUTROABS  13.5*  --   --   HGB 12.5 11.4* 10.8*  HCT 38.6 35.5*  33.7* 34.5*  MCV 89.4 91.3 93.8  PLT 314 281 262   Cardiac Enzymes: Recent Labs  Lab 03/21/18 1701  CKTOTAL 50   BNP: Invalid input(s): POCBNP CBG: No results for input(s): GLUCAP in the last 168 hours. D-Dimer No results for input(s): DDIMER in the  last 72 hours. Hgb A1c No results for input(s): HGBA1C in the last 72 hours. Lipid Profile No results for input(s): CHOL, HDL, LDLCALC, TRIG, CHOLHDL, LDLDIRECT in the last 72 hours. Thyroid function studies No results for input(s): TSH, T4TOTAL, T3FREE, THYROIDAB in the last 72 hours.  Invalid input(s): FREET3 Anemia work up No results for input(s): VITAMINB12, FOLATE, FERRITIN, TIBC, IRON, RETICCTPCT in the last 72 hours. Urinalysis    Component Value Date/Time   COLORURINE AMBER (A) 03/21/2018 1415   APPEARANCEUR HAZY (A) 03/21/2018 1415   APPEARANCEUR Clear 12/03/2017 1547   LABSPEC 1.023 03/21/2018 1415   PHURINE 5.0 03/21/2018 1415   GLUCOSEU NEGATIVE 03/21/2018 1415   HGBUR NEGATIVE 03/21/2018 1415   BILIRUBINUR NEGATIVE 03/21/2018 1415   BILIRUBINUR Negative 12/03/2017 1547   KETONESUR 5 (A) 03/21/2018 1415   PROTEINUR NEGATIVE 03/21/2018 1415   UROBILINOGEN 1.0 06/16/2014 1825   NITRITE NEGATIVE 03/21/2018 1415   LEUKOCYTESUR MODERATE (A) 03/21/2018 1415   LEUKOCYTESUR Negative 12/03/2017 1547   Sepsis Labs Invalid input(s): PROCALCITONIN,  WBC,  LACTICIDVEN Microbiology Recent Results (from the past 240 hour(s))  Urine culture     Status: Abnormal   Collection Time: 03/21/18  2:15 PM  Result Value Ref Range Status   Specimen Description   Final    URINE, RANDOM Performed at [facility], [place] [address], [place], [place] [address]    Special Requests   Final    NONE Performed at [facility], [place] [address], [place], [place] [address]    Culture >=100,000 COLONIES/mL ENTEROBACTER AEROGENES (A)  Final    Report Status 03/23/2018 FINAL  Final   Organism ID, Bacteria ENTEROBACTER AEROGENES (A)  Final      Susceptibility   Enterobacter aerogenes - MIC*    CEFAZOLIN >=64 RESISTANT Resistant     CEFTRIAXONE <=1 SENSITIVE Sensitive     CIPROFLOXACIN <=0.25 SENSITIVE Sensitive     GENTAMICIN <=1 SENSITIVE Sensitive     IMIPENEM 1 SENSITIVE Sensitive     NITROFURANTOIN 128 RESISTANT Resistant     TRIMETH/SULFA <=20 SENSITIVE Sensitive     PIP/TAZO <=4 SENSITIVE Sensitive     * >=100,000 COLONIES/mL ENTEROBACTER AEROGENES     Time coordinating discharge:  33 minutes  SIGNED:   Kayleen Memos, MD  [facility] 03/26/2018, 1:55 PM Pager   If 7PM-7AM, please contact night-coverage [email or web address] Password [email or web address]

## 2018-03-26 NOTE — Progress Notes (Signed)
EMS arrived to transport pt to [facility], RN

## 2018-04-03 ENCOUNTER — Encounter: Payer: Medicare Other | Admitting: *Deleted

## 2018-04-09 DIAGNOSIS — N179 Acute kidney failure, unspecified: Secondary | ICD-10-CM | POA: Diagnosis not present

## 2018-04-09 DIAGNOSIS — G9341 Metabolic encephalopathy: Secondary | ICD-10-CM | POA: Diagnosis not present

## 2018-04-09 DIAGNOSIS — N390 Urinary tract infection, site not specified: Secondary | ICD-10-CM | POA: Diagnosis not present

## 2018-04-10 ENCOUNTER — Telehealth: Payer: Self-pay | Admitting: *Deleted

## 2018-04-10 DIAGNOSIS — I10 Essential (primary) hypertension: Secondary | ICD-10-CM | POA: Diagnosis not present

## 2018-04-10 DIAGNOSIS — N390 Urinary tract infection, site not specified: Secondary | ICD-10-CM | POA: Diagnosis not present

## 2018-04-10 DIAGNOSIS — N189 Chronic kidney disease, unspecified: Secondary | ICD-10-CM | POA: Diagnosis not present

## 2018-04-10 NOTE — Telephone Encounter (Signed)
Deborah Rivers from [facility] aware and verbalizes understanding.

## 2018-04-10 NOTE — Telephone Encounter (Signed)
Yes okay to give verbal order for that, as long as she does not have any falling episodes.

## 2018-04-10 NOTE — Telephone Encounter (Signed)
TC from [place] at [facility] is being admitted today Daughter does not think her Lorazepam 0.5 1/2 tab QHS will be enough, can [facility] get a verbal ok to change order to increase to 1 tab BID Patient has hospital follow up on Monday 04/14/18 Please advise

## 2018-04-14 ENCOUNTER — Ambulatory Visit (INDEPENDENT_AMBULATORY_CARE_PROVIDER_SITE_OTHER): Payer: Medicare Other | Admitting: Family Medicine

## 2018-04-14 ENCOUNTER — Encounter: Payer: Self-pay | Admitting: Family Medicine

## 2018-04-14 VITALS — BP 124/67 | HR 87 | Temp 97.4°F | Ht 63.0 in | Wt 110.6 lb

## 2018-04-14 DIAGNOSIS — R413 Other amnesia: Secondary | ICD-10-CM | POA: Diagnosis not present

## 2018-04-14 DIAGNOSIS — D649 Anemia, unspecified: Secondary | ICD-10-CM | POA: Diagnosis not present

## 2018-04-14 DIAGNOSIS — N3000 Acute cystitis without hematuria: Secondary | ICD-10-CM | POA: Diagnosis not present

## 2018-04-14 LAB — MICROSCOPIC EXAMINATION: Epithelial Cells (non renal): >10 /hpf — AB (ref 0–10)

## 2018-04-14 LAB — URINALYSIS, COMPLETE
Bilirubin, UA: NEGATIVE
Glucose, UA: NEGATIVE
Nitrite, UA: NEGATIVE
RBC, UA: NEGATIVE
Specific Gravity, UA: 1.02 (ref 1.005–1.030)
Urobilinogen, Ur: 0.2 mg/dL (ref 0.2–1.0)
pH, UA: 6 (ref 5.0–7.5)

## 2018-04-14 NOTE — Progress Notes (Signed)
 BP 124/67   Pulse 87   Temp (!) 97.4 F (36.3 C) (Oral)   Ht 5' 3" (1.6 m)   Wt 110 lb 9.6 oz (50.2 kg)   BMI 19.59 kg/m    Subjective:    Patient ID: Deborah Rivers, female    DOB: 08/31/1924, 85 y.o.   MRN: [id number]  HPI: Deborah Rivers is a 85 y.o. female presenting on 04/14/2018 for [facility] rehab follow up (Patient is now at [facility] since thurday )   HPI Dysuria and blood in her urine  Patient is coming in today for a visit for difficulty with memory changes and increased urinary frequency and hematuria.  She has been at [facility] since leaving the hospital and they are concerned that her memory has not been the same as it was prior to the hospitalization.  She was hospitalized on 03/21/2018 and discharged on 03/26/2020 [facility].  She was admitted for a urinary tract infection and altered mental status and a treated urinary tract infection which improved but her mental status did not fully returned to normal.  Patient was also found to be somewhat anemic during the hospitalization and is coming in for recheck of that as well.  She has had some memory issues before but she is definitely not at baseline with memory prior to when I had seen her before.  They deny him having any fevers or chills.  Patient's memory is not the greatest today and so a lot of this information is provided by a staff person from [facility]  Relevant past medical, surgical, family and social history reviewed and updated as indicated. Interim medical history since our last visit reviewed. Allergies and medications reviewed and updated.  Review of Systems  Constitutional: Negative for chills and fever.  Eyes: Negative for visual disturbance.  Respiratory: Negative for chest tightness and shortness of breath.   Cardiovascular: Negative for chest pain and leg swelling.  Genitourinary: Positive for frequency and hematuria. Negative for decreased urine volume, difficulty urinating, dysuria and  urgency.  Musculoskeletal: Negative for back pain and gait problem.  Skin: Negative for rash.  Neurological: Negative for light-headedness and headaches.  Psychiatric/Behavioral: Negative for agitation and behavioral problems.  All other systems reviewed and are negative.   Per HPI unless specifically indicated above   Allergies as of 04/14/2018      Reactions   Sulfa Antibiotics Nausea Only, Anxiety, Rash      Medication List       Accurate as of April 14, 2018  1:41 PM. Always use your most recent med list.        amLODipine 5 MG tablet Commonly known as:  NORVASC Take 1 tablet (5 mg total) by mouth daily.   aspirin 81 MG tablet Take 1 tablet (81 mg total) by mouth daily.   atorvastatin 20 MG tablet Commonly known as:  LIPITOR Take 1 tablet (20 mg total) by mouth daily at 6 PM.   CENTRUM SILVER PO Take 1 tablet by mouth daily.   ergocalciferol 1.25 MG (50000 UT) capsule Commonly known as:  VITAMIN D2 Take 50,000 Units by mouth once a week. Mon   fluticasone 50 MCG/ACT nasal spray Commonly known as:  FLONASE Place 2 sprays into both nostrils daily.   hydrocortisone cream 1 % Apply topically 3 (three) times daily as needed for itching.   LORazepam 0.5 MG tablet Commonly known as:  ATIVAN TAKE 1/2 (ONE-HALF) TABLET BY MOUTH AT BEDTIME   megestrol 20   MG tablet Commonly known as:  MEGACE Take 20 mg by mouth daily.   sertraline 50 MG tablet Commonly known as:  ZOLOFT Take 1 tablet (50 mg total) by mouth daily.   SYNTHROID 50 MCG tablet Generic drug:  levothyroxine Take 1 tablet (50 mcg total) by mouth daily.          Objective:    BP 124/67   Pulse 87   Temp (!) 97.4 F (36.3 C) (Oral)   Ht 5' 3" (1.6 m)   Wt 110 lb 9.6 oz (50.2 kg)   BMI 19.59 kg/m   Wt Readings from Last 3 Encounters:  04/14/18 110 lb 9.6 oz (50.2 kg)  03/21/18 105 lb 9.6 oz (47.9 kg)  03/03/18 111 lb 12.8 oz (50.7 kg)    Physical Exam Vitals signs and nursing note  reviewed.  Constitutional:      General: She is not in acute distress.    Appearance: She is well-developed. She is not diaphoretic.  Eyes:     Conjunctiva/sclera: Conjunctivae normal.  Cardiovascular:     Rate and Rhythm: Normal rate and regular rhythm.     Heart sounds: Normal heart sounds. No murmur.  Pulmonary:     Effort: Pulmonary effort is normal. No respiratory distress.     Breath sounds: Normal breath sounds. No wheezing.  Abdominal:     General: Abdomen is flat. Bowel sounds are normal. There is no distension.     Tenderness: There is no abdominal tenderness. There is no right CVA tenderness, left CVA tenderness or guarding.  Skin:    General: Skin is warm and dry.     Findings: No rash.  Neurological:     Mental Status: She is alert and oriented to person, place, and time.     Coordination: Coordination normal.  Psychiatric:        Behavior: Behavior normal.        Cognition and Memory: Cognition is impaired. Memory is impaired.    Urinalysis trace ketones and trace leukocytes and greater than 10 epithelial cells and few bacteria, does not look like she has signs of infection today     Assessment & Plan:   Problem List Items Addressed This Visit      Genitourinary   Acute cystitis without hematuria - Primary   Relevant Orders   Urinalysis, Complete (Completed)     Other   Anemia   Relevant Orders   CBC with Differential/Platelet (Completed)   CMP14+EGFR (Completed)    Other Visit Diagnoses    Memory changes          Memory changes could likely be because of being in a new facility or new place but also could be correlated to a recurrent or new infection, will test urine blood work today.  Does not look like signs of infection on urine today, will run blood work as well to see where her white count is, some of the memory changes could just be the fact that she is not at home and she is in facility and has worsened her dementia Follow up plan: Return if  symptoms worsen or fail to improve.  Counseling provided for all of the vaccine components Orders Placed This Encounter  Procedures  . CBC with Differential/Platelet  . CMP14+EGFR  . Urinalysis, Complete    Caryl Pina, MD [facility] 04/14/2018, 1:41 PM

## 2018-04-15 DIAGNOSIS — N183 Chronic kidney disease, stage 3 (moderate): Secondary | ICD-10-CM | POA: Diagnosis not present

## 2018-04-15 DIAGNOSIS — G9341 Metabolic encephalopathy: Secondary | ICD-10-CM | POA: Diagnosis not present

## 2018-04-15 DIAGNOSIS — Z7982 Long term (current) use of aspirin: Secondary | ICD-10-CM | POA: Diagnosis not present

## 2018-04-15 DIAGNOSIS — Z87440 Personal history of urinary (tract) infections: Secondary | ICD-10-CM | POA: Diagnosis not present

## 2018-04-15 DIAGNOSIS — M6281 Muscle weakness (generalized): Secondary | ICD-10-CM | POA: Diagnosis not present

## 2018-04-15 DIAGNOSIS — I2510 Atherosclerotic heart disease of native coronary artery without angina pectoris: Secondary | ICD-10-CM | POA: Diagnosis not present

## 2018-04-15 DIAGNOSIS — I129 Hypertensive chronic kidney disease with stage 1 through stage 4 chronic kidney disease, or unspecified chronic kidney disease: Secondary | ICD-10-CM | POA: Diagnosis not present

## 2018-04-15 DIAGNOSIS — N3000 Acute cystitis without hematuria: Secondary | ICD-10-CM | POA: Diagnosis not present

## 2018-04-15 DIAGNOSIS — Z9181 History of falling: Secondary | ICD-10-CM | POA: Diagnosis not present

## 2018-04-15 DIAGNOSIS — E782 Mixed hyperlipidemia: Secondary | ICD-10-CM | POA: Diagnosis not present

## 2018-04-15 LAB — CMP14+EGFR
ALT: 15 IU/L (ref 0–32)
AST: 15 IU/L (ref 0–40)
Albumin/Globulin Ratio: 2.1 (ref 1.2–2.2)
Albumin: 4 g/dL (ref 3.5–4.6)
Alkaline Phosphatase: 71 IU/L (ref 39–117)
BUN/Creatinine Ratio: 22 (ref 12–28)
BUN: 28 mg/dL (ref 10–36)
Bilirubin Total: 0.3 mg/dL (ref 0.0–1.2)
CO2: 20 mmol/L (ref 20–29)
CREATININE: 1.26 mg/dL — AB (ref 0.57–1.00)
Calcium: 9.4 mg/dL (ref 8.7–10.3)
Chloride: 105 mmol/L (ref 96–106)
GFR calc Af Amer: 43 mL/min/{1.73_m2} — ABNORMAL LOW (ref >59)
GFR calc non Af Amer: 37 mL/min/{1.73_m2} — ABNORMAL LOW (ref >59)
Globulin, Total: 1.9 g/dL (ref 1.5–4.5)
Glucose: 128 mg/dL — ABNORMAL HIGH (ref 65–99)
Potassium: 3.9 mmol/L (ref 3.5–5.2)
Sodium: 143 mmol/L (ref 134–144)
Total Protein: 5.9 g/dL — ABNORMAL LOW (ref 6.0–8.5)

## 2018-04-15 LAB — CBC WITH DIFFERENTIAL/PLATELET
Basophils Absolute: 0 10*3/uL (ref 0.0–0.2)
Basos: 0 %
EOS (ABSOLUTE): 0.2 10*3/uL (ref 0.0–0.4)
Eos: 2 %
Hematocrit: 31.8 % — ABNORMAL LOW (ref 34.0–46.6)
Hemoglobin: 10.6 g/dL — ABNORMAL LOW (ref 11.1–15.9)
IMMATURE GRANULOCYTES: 0 %
Immature Grans (Abs): 0 10*3/uL (ref 0.0–0.1)
Lymphocytes Absolute: 2 10*3/uL (ref 0.7–3.1)
Lymphs: 24 %
MCH: 29.6 pg (ref 26.6–33.0)
MCHC: 33.3 g/dL (ref 31.5–35.7)
MCV: 89 fL (ref 79–97)
Monocytes Absolute: 0.8 10*3/uL (ref 0.1–0.9)
Monocytes: 10 %
NEUTROS PCT: 64 %
Neutrophils Absolute: 5.3 10*3/uL (ref 1.4–7.0)
Platelets: 352 10*3/uL (ref 150–450)
RBC: 3.58 x10E6/uL — ABNORMAL LOW (ref 3.77–5.28)
RDW: 13.6 % (ref 11.7–15.4)
WBC: 8.3 10*3/uL (ref 3.4–10.8)

## 2018-04-18 DIAGNOSIS — Z87440 Personal history of urinary (tract) infections: Secondary | ICD-10-CM | POA: Diagnosis not present

## 2018-04-18 DIAGNOSIS — M6281 Muscle weakness (generalized): Secondary | ICD-10-CM | POA: Diagnosis not present

## 2018-04-18 DIAGNOSIS — N183 Chronic kidney disease, stage 3 (moderate): Secondary | ICD-10-CM | POA: Diagnosis not present

## 2018-04-18 DIAGNOSIS — Z9181 History of falling: Secondary | ICD-10-CM | POA: Diagnosis not present

## 2018-04-18 DIAGNOSIS — N3000 Acute cystitis without hematuria: Secondary | ICD-10-CM | POA: Diagnosis not present

## 2018-04-18 DIAGNOSIS — Z7982 Long term (current) use of aspirin: Secondary | ICD-10-CM | POA: Diagnosis not present

## 2018-04-18 DIAGNOSIS — G9341 Metabolic encephalopathy: Secondary | ICD-10-CM | POA: Diagnosis not present

## 2018-04-18 DIAGNOSIS — I129 Hypertensive chronic kidney disease with stage 1 through stage 4 chronic kidney disease, or unspecified chronic kidney disease: Secondary | ICD-10-CM | POA: Diagnosis not present

## 2018-04-18 DIAGNOSIS — E782 Mixed hyperlipidemia: Secondary | ICD-10-CM | POA: Diagnosis not present

## 2018-04-18 DIAGNOSIS — I2510 Atherosclerotic heart disease of native coronary artery without angina pectoris: Secondary | ICD-10-CM | POA: Diagnosis not present

## 2018-04-21 DIAGNOSIS — N183 Chronic kidney disease, stage 3 (moderate): Secondary | ICD-10-CM | POA: Diagnosis not present

## 2018-04-21 DIAGNOSIS — I2510 Atherosclerotic heart disease of native coronary artery without angina pectoris: Secondary | ICD-10-CM | POA: Diagnosis not present

## 2018-04-21 DIAGNOSIS — Z87440 Personal history of urinary (tract) infections: Secondary | ICD-10-CM | POA: Diagnosis not present

## 2018-04-21 DIAGNOSIS — I129 Hypertensive chronic kidney disease with stage 1 through stage 4 chronic kidney disease, or unspecified chronic kidney disease: Secondary | ICD-10-CM | POA: Diagnosis not present

## 2018-04-21 DIAGNOSIS — Z9181 History of falling: Secondary | ICD-10-CM | POA: Diagnosis not present

## 2018-04-21 DIAGNOSIS — M6281 Muscle weakness (generalized): Secondary | ICD-10-CM | POA: Diagnosis not present

## 2018-04-21 DIAGNOSIS — E782 Mixed hyperlipidemia: Secondary | ICD-10-CM | POA: Diagnosis not present

## 2018-04-21 DIAGNOSIS — G9341 Metabolic encephalopathy: Secondary | ICD-10-CM | POA: Diagnosis not present

## 2018-04-21 DIAGNOSIS — Z7982 Long term (current) use of aspirin: Secondary | ICD-10-CM | POA: Diagnosis not present

## 2018-04-21 DIAGNOSIS — N3000 Acute cystitis without hematuria: Secondary | ICD-10-CM | POA: Diagnosis not present

## 2018-04-23 ENCOUNTER — Ambulatory Visit (INDEPENDENT_AMBULATORY_CARE_PROVIDER_SITE_OTHER): Payer: Medicare Other

## 2018-04-23 DIAGNOSIS — M6281 Muscle weakness (generalized): Secondary | ICD-10-CM | POA: Diagnosis not present

## 2018-04-23 DIAGNOSIS — F339 Major depressive disorder, recurrent, unspecified: Secondary | ICD-10-CM

## 2018-04-23 DIAGNOSIS — Z87440 Personal history of urinary (tract) infections: Secondary | ICD-10-CM

## 2018-04-23 DIAGNOSIS — R41841 Cognitive communication deficit: Secondary | ICD-10-CM

## 2018-04-23 DIAGNOSIS — N3000 Acute cystitis without hematuria: Secondary | ICD-10-CM | POA: Diagnosis not present

## 2018-04-23 DIAGNOSIS — I2510 Atherosclerotic heart disease of native coronary artery without angina pectoris: Secondary | ICD-10-CM

## 2018-04-23 DIAGNOSIS — E782 Mixed hyperlipidemia: Secondary | ICD-10-CM

## 2018-04-23 DIAGNOSIS — Z9181 History of falling: Secondary | ICD-10-CM

## 2018-04-23 DIAGNOSIS — F419 Anxiety disorder, unspecified: Secondary | ICD-10-CM

## 2018-04-23 DIAGNOSIS — I129 Hypertensive chronic kidney disease with stage 1 through stage 4 chronic kidney disease, or unspecified chronic kidney disease: Secondary | ICD-10-CM | POA: Diagnosis not present

## 2018-04-23 DIAGNOSIS — Z7982 Long term (current) use of aspirin: Secondary | ICD-10-CM | POA: Diagnosis not present

## 2018-04-23 DIAGNOSIS — G9341 Metabolic encephalopathy: Secondary | ICD-10-CM

## 2018-04-23 DIAGNOSIS — N183 Chronic kidney disease, stage 3 (moderate): Secondary | ICD-10-CM

## 2018-04-30 DIAGNOSIS — M6281 Muscle weakness (generalized): Secondary | ICD-10-CM | POA: Diagnosis not present

## 2018-04-30 DIAGNOSIS — Z9181 History of falling: Secondary | ICD-10-CM | POA: Diagnosis not present

## 2018-04-30 DIAGNOSIS — I129 Hypertensive chronic kidney disease with stage 1 through stage 4 chronic kidney disease, or unspecified chronic kidney disease: Secondary | ICD-10-CM | POA: Diagnosis not present

## 2018-04-30 DIAGNOSIS — Z87440 Personal history of urinary (tract) infections: Secondary | ICD-10-CM | POA: Diagnosis not present

## 2018-04-30 DIAGNOSIS — I2510 Atherosclerotic heart disease of native coronary artery without angina pectoris: Secondary | ICD-10-CM | POA: Diagnosis not present

## 2018-04-30 DIAGNOSIS — G9341 Metabolic encephalopathy: Secondary | ICD-10-CM | POA: Diagnosis not present

## 2018-04-30 DIAGNOSIS — Z7982 Long term (current) use of aspirin: Secondary | ICD-10-CM | POA: Diagnosis not present

## 2018-04-30 DIAGNOSIS — N3000 Acute cystitis without hematuria: Secondary | ICD-10-CM | POA: Diagnosis not present

## 2018-04-30 DIAGNOSIS — N183 Chronic kidney disease, stage 3 (moderate): Secondary | ICD-10-CM | POA: Diagnosis not present

## 2018-04-30 DIAGNOSIS — E782 Mixed hyperlipidemia: Secondary | ICD-10-CM | POA: Diagnosis not present

## 2018-05-02 ENCOUNTER — Telehealth: Payer: Self-pay

## 2018-05-02 DIAGNOSIS — I2510 Atherosclerotic heart disease of native coronary artery without angina pectoris: Secondary | ICD-10-CM | POA: Diagnosis not present

## 2018-05-02 DIAGNOSIS — G9341 Metabolic encephalopathy: Secondary | ICD-10-CM | POA: Diagnosis not present

## 2018-05-02 DIAGNOSIS — I129 Hypertensive chronic kidney disease with stage 1 through stage 4 chronic kidney disease, or unspecified chronic kidney disease: Secondary | ICD-10-CM | POA: Diagnosis not present

## 2018-05-02 DIAGNOSIS — Z7982 Long term (current) use of aspirin: Secondary | ICD-10-CM | POA: Diagnosis not present

## 2018-05-02 DIAGNOSIS — N3000 Acute cystitis without hematuria: Secondary | ICD-10-CM | POA: Diagnosis not present

## 2018-05-02 DIAGNOSIS — Z9181 History of falling: Secondary | ICD-10-CM | POA: Diagnosis not present

## 2018-05-02 DIAGNOSIS — M6281 Muscle weakness (generalized): Secondary | ICD-10-CM | POA: Diagnosis not present

## 2018-05-02 DIAGNOSIS — N183 Chronic kidney disease, stage 3 (moderate): Secondary | ICD-10-CM | POA: Diagnosis not present

## 2018-05-02 DIAGNOSIS — Z87440 Personal history of urinary (tract) infections: Secondary | ICD-10-CM | POA: Diagnosis not present

## 2018-05-02 DIAGNOSIS — E782 Mixed hyperlipidemia: Secondary | ICD-10-CM | POA: Diagnosis not present

## 2018-05-02 NOTE — Telephone Encounter (Signed)
Want to know when we can pick up [place] papers I dropped off a week ago

## 2018-05-05 ENCOUNTER — Telehealth: Payer: Self-pay | Admitting: Family Medicine

## 2018-05-05 NOTE — Telephone Encounter (Signed)
Spoke with [place] at [facility].  She will let Ms. Koehne know we can see her every 3 months instead of 6 and that she should call to make an appointment to be seen in May.

## 2018-05-05 NOTE — Telephone Encounter (Signed)
Pt aware ppw in process, will be in next 2 days

## 2018-05-05 NOTE — Telephone Encounter (Signed)
That is fine we can go ahead and move her up to 3 months and we will see her at 3 months instead.

## 2018-05-07 ENCOUNTER — Telehealth: Payer: Self-pay | Admitting: Family Medicine

## 2018-05-07 NOTE — Telephone Encounter (Signed)
Have her get some over-the-counter moisturizing eyedrops, if it worsens and she starts getting pus or not improving then have her come see [place].

## 2018-05-07 NOTE — Telephone Encounter (Signed)
No discharge or itching

## 2018-05-07 NOTE — Telephone Encounter (Signed)
[facility] aware.  

## 2018-05-09 DIAGNOSIS — M6281 Muscle weakness (generalized): Secondary | ICD-10-CM | POA: Diagnosis not present

## 2018-05-09 DIAGNOSIS — G9341 Metabolic encephalopathy: Secondary | ICD-10-CM | POA: Diagnosis not present

## 2018-05-09 DIAGNOSIS — R1032 Left lower quadrant pain: Secondary | ICD-10-CM | POA: Diagnosis not present

## 2018-05-12 ENCOUNTER — Telehealth: Payer: Self-pay | Admitting: *Deleted

## 2018-05-12 DIAGNOSIS — R300 Dysuria: Secondary | ICD-10-CM

## 2018-05-12 NOTE — Telephone Encounter (Signed)
Okay I agree, let us know when they get the results

## 2018-05-12 NOTE — Telephone Encounter (Signed)
Would like order for UA Cx Pt is more confused, c/o dysuria, and LBP Order placed

## 2018-05-13 ENCOUNTER — Other Ambulatory Visit: Payer: Medicare Other

## 2018-05-13 ENCOUNTER — Other Ambulatory Visit: Payer: Self-pay

## 2018-05-13 DIAGNOSIS — R300 Dysuria: Secondary | ICD-10-CM

## 2018-05-13 LAB — MICROSCOPIC EXAMINATION

## 2018-05-13 LAB — URINALYSIS, COMPLETE
Bilirubin, UA: NEGATIVE
Glucose, UA: NEGATIVE
Ketones, UA: NEGATIVE
Leukocytes, UA: NEGATIVE
Nitrite, UA: NEGATIVE
PH UA: 7.5 (ref 5.0–7.5)
Protein, UA: NEGATIVE
Specific Gravity, UA: 1.02 (ref 1.005–1.030)
Urobilinogen, Ur: 0.2 mg/dL (ref 0.2–1.0)

## 2018-05-14 LAB — URINE CULTURE

## 2018-05-19 ENCOUNTER — Telehealth: Payer: Self-pay | Admitting: Family Medicine

## 2018-05-19 MED ORDER — DICLOFENAC SODIUM 1 % TD GEL: 2.0000 g | Freq: Four times a day (QID) | TRANSDERMAL | 6 refills | Status: DC

## 2018-05-19 NOTE — Telephone Encounter (Signed)
Diclofenac topical gel Prescription sent to pharmacy

## 2018-05-19 NOTE — Telephone Encounter (Signed)
I did try to call 3 times but was never able to get through, will try again tomorrow, please verify the number because when I did call it went to a generic answering machine without a name.

## 2018-05-19 NOTE — Telephone Encounter (Signed)
[facility] aware of script.

## 2018-05-19 NOTE — Telephone Encounter (Signed)
Left message for daughter, Dr. Warrick Parisian will call again tomorrow.  He will call [phone].  Please call us back if this is not the correct number for him to call.

## 2018-05-19 NOTE — Telephone Encounter (Signed)
FYI--All contact information has been updated. Jeannie(daughter) is the point of contact since pt is at [facility].

## 2018-05-20 ENCOUNTER — Telehealth: Payer: Self-pay | Admitting: Family Medicine

## 2018-05-20 DIAGNOSIS — R300 Dysuria: Secondary | ICD-10-CM

## 2018-05-20 MED ORDER — CEPHALEXIN 500 MG PO CAPS: 500.0000 mg | ORAL_CAPSULE | Freq: Four times a day (QID) | ORAL | 0 refills | Status: DC

## 2018-05-20 NOTE — Telephone Encounter (Signed)
Spoke with her about all of her concerns Caryl Pina, MD [facility] 05/20/2018, 12:56 PM

## 2018-05-20 NOTE — Telephone Encounter (Signed)
Can we send in keflex 500 qid for 7 days for Deborah Rivers for possible uti, I sent it to the [facility]. Caryl Pina, MD [facility] 05/20/2018, 12:55 PM

## 2018-05-20 NOTE — Telephone Encounter (Signed)
Aware paperwork finished

## 2018-06-02 ENCOUNTER — Telehealth: Payer: Self-pay

## 2018-06-02 NOTE — Telephone Encounter (Signed)
Aware and verbalizes understanding.  

## 2018-06-02 NOTE — Telephone Encounter (Signed)
[place] called from [facility] and states that patient has been having bilateral foot swelling from ankle down x 1 week. Wanting to know if she should try ted hose? Please advise

## 2018-06-02 NOTE — Telephone Encounter (Signed)
Yes okay to go ahead and try TED hose or compression stockings

## 2018-06-04 ENCOUNTER — Encounter: Payer: Self-pay | Admitting: Family Medicine

## 2018-06-04 ENCOUNTER — Encounter (HOSPITAL_COMMUNITY): Payer: Self-pay | Admitting: Emergency Medicine

## 2018-06-04 ENCOUNTER — Emergency Department (HOSPITAL_COMMUNITY): Payer: Medicare Other

## 2018-06-04 ENCOUNTER — Other Ambulatory Visit: Payer: Self-pay

## 2018-06-04 ENCOUNTER — Ambulatory Visit (INDEPENDENT_AMBULATORY_CARE_PROVIDER_SITE_OTHER): Payer: Medicare Other | Admitting: Family Medicine

## 2018-06-04 ENCOUNTER — Telehealth: Payer: Self-pay | Admitting: Family Medicine

## 2018-06-04 ENCOUNTER — Emergency Department (HOSPITAL_COMMUNITY)
Admission: EM | Admit: 2018-06-04 | Discharge: 2018-06-04 | Disposition: A | Discharge status: Home / Self Care | Payer: Medicare Other | Attending: Emergency Medicine | Admitting: Emergency Medicine

## 2018-06-04 DIAGNOSIS — Z79899 Other long term (current) drug therapy: Secondary | ICD-10-CM | POA: Insufficient documentation

## 2018-06-04 DIAGNOSIS — S5011XA Contusion of right forearm, initial encounter: Secondary | ICD-10-CM | POA: Insufficient documentation

## 2018-06-04 DIAGNOSIS — F0390 Unspecified dementia without behavioral disturbance: Secondary | ICD-10-CM | POA: Diagnosis not present

## 2018-06-04 DIAGNOSIS — E039 Hypothyroidism, unspecified: Secondary | ICD-10-CM | POA: Insufficient documentation

## 2018-06-04 DIAGNOSIS — I129 Hypertensive chronic kidney disease with stage 1 through stage 4 chronic kidney disease, or unspecified chronic kidney disease: Secondary | ICD-10-CM | POA: Diagnosis not present

## 2018-06-04 DIAGNOSIS — Y929 Unspecified place or not applicable: Secondary | ICD-10-CM | POA: Insufficient documentation

## 2018-06-04 DIAGNOSIS — T6391XA Toxic effect of contact with unspecified venomous animal, accidental (unintentional), initial encounter: Secondary | ICD-10-CM | POA: Diagnosis not present

## 2018-06-04 DIAGNOSIS — Z7982 Long term (current) use of aspirin: Secondary | ICD-10-CM | POA: Diagnosis not present

## 2018-06-04 DIAGNOSIS — N183 Chronic kidney disease, stage 3 (moderate): Secondary | ICD-10-CM | POA: Diagnosis not present

## 2018-06-04 DIAGNOSIS — M7989 Other specified soft tissue disorders: Secondary | ICD-10-CM | POA: Diagnosis not present

## 2018-06-04 DIAGNOSIS — Z7689 Persons encountering health services in other specified circumstances: Secondary | ICD-10-CM | POA: Diagnosis not present

## 2018-06-04 DIAGNOSIS — X58XXXA Exposure to other specified factors, initial encounter: Secondary | ICD-10-CM | POA: Insufficient documentation

## 2018-06-04 DIAGNOSIS — S40021A Contusion of right upper arm, initial encounter: Secondary | ICD-10-CM

## 2018-06-04 DIAGNOSIS — R52 Pain, unspecified: Secondary | ICD-10-CM | POA: Diagnosis not present

## 2018-06-04 DIAGNOSIS — Y999 Unspecified external cause status: Secondary | ICD-10-CM | POA: Diagnosis not present

## 2018-06-04 DIAGNOSIS — S4991XA Unspecified injury of right shoulder and upper arm, initial encounter: Secondary | ICD-10-CM | POA: Diagnosis present

## 2018-06-04 DIAGNOSIS — R404 Transient alteration of awareness: Secondary | ICD-10-CM | POA: Diagnosis not present

## 2018-06-04 DIAGNOSIS — Y939 Activity, unspecified: Secondary | ICD-10-CM | POA: Diagnosis not present

## 2018-06-04 LAB — PROTIME-INR
INR: 1 (ref 0.8–1.2)
Prothrombin Time: 13.1 seconds (ref 11.4–15.2)

## 2018-06-04 LAB — BASIC METABOLIC PANEL
Anion gap: 9 (ref 5–15)
BUN: 32 mg/dL — ABNORMAL HIGH (ref 8–23)
CO2: 22 mmol/L (ref 22–32)
Calcium: 9 mg/dL (ref 8.9–10.3)
Chloride: 109 mmol/L (ref 98–111)
Creatinine, Ser: 1.37 mg/dL — ABNORMAL HIGH (ref 0.44–1.00)
GFR calc Af Amer: 39 mL/min — ABNORMAL LOW (ref >60)
GFR calc non Af Amer: 34 mL/min — ABNORMAL LOW (ref >60)
Glucose, Bld: 101 mg/dL — ABNORMAL HIGH (ref 70–99)
Potassium: 3.3 mmol/L — ABNORMAL LOW (ref 3.5–5.1)
Sodium: 140 mmol/L (ref 135–145)

## 2018-06-04 LAB — CBC
HCT: 30.1 % — ABNORMAL LOW (ref 36.0–46.0)
Hemoglobin: 9.5 g/dL — ABNORMAL LOW (ref 12.0–15.0)
MCH: 28.9 pg (ref 26.0–34.0)
MCHC: 31.6 g/dL (ref 30.0–36.0)
MCV: 91.5 fL (ref 80.0–100.0)
Platelets: 247 10*3/uL (ref 150–400)
RBC: 3.29 MIL/uL — ABNORMAL LOW (ref 3.87–5.11)
RDW: 14.3 % (ref 11.5–15.5)
WBC: 9.9 10*3/uL (ref 4.0–10.5)
nRBC: 0 % (ref 0.0–0.2)

## 2018-06-04 NOTE — Progress Notes (Signed)
Virtual visit  Subjective: CC: ?spider bite PCP: Dettinger, Fransisca Kaufmann, MD [id number] B Deborah Rivers is a 85 y.o. female calls for telephone consult today. Patient provides verbal consent for consult held via phone.  Location of patient: [place] office Location of provider: [facility] Others present for call: nurse at [facility] visit using [place]  1. ?Spider bite Last evening, patient sustained what she thought may have been a spider bite.  She had rolled over in her bed and felt a stinging, clamping sensation along the right forearm.  She subsequently had swelling and discoloration.  The swelling and discoloration had progressed quite a bit overnight and extends now to the entire forearm.  She had minimally increased warmth but quite a bit of bruising and tightness in the arm.  She never visualize an insect or the etiology of what caused her symptoms.  She is accompanied today by a nurse at [facility] who also had Dopplers obtained which she reports were normal.  Patient does report discomfort and some pain with movement of the arm.  The swelling and discoloration has not progressed further since this morning.   ROS: Per HPI  Allergies  Allergen Reactions  . Sulfa Antibiotics Nausea Only, Anxiety and Rash   Past Medical History:  Diagnosis Date  . Allergy   . Anemia   . Anxiety   . Cataract   . Depression   . Diverticulitis   . Essential hypertension   . GERD (gastroesophageal reflux disease)   . Hyperlipidemia   . Hypothyroidism   . Recurrent UTI     Current Outpatient Medications:  .  amLODipine (NORVASC) 5 MG tablet, Take 1 tablet (5 mg total) by mouth daily., Disp: 90 tablet, Rfl: 3 .  aspirin 81 MG tablet, Take 1 tablet (81 mg total) by mouth daily., Disp: 90 tablet, Rfl: 1 .  atorvastatin (LIPITOR) 20 MG tablet, Take 1 tablet (20 mg total) by mouth daily at 6 PM., Disp: 90 tablet, Rfl: 3 .  diclofenac sodium (VOLTAREN) 1 % GEL, Apply 2 g  topically 4 (four) times daily., Disp: 100 g, Rfl: 6 .  ergocalciferol (VITAMIN D2) 50000 UNITS capsule, Take 50,000 Units by mouth once a week. Mon, Disp: , Rfl:  .  fluticasone (FLONASE) 50 MCG/ACT nasal spray, Place 2 sprays into both nostrils daily. (Patient taking differently: Place 2 sprays into both nostrils daily as needed for allergies. ), Disp: 16 g, Rfl: 6 .  hydrocortisone cream 1 %, Apply topically 3 (three) times daily as needed for itching., Disp: 30 g, Rfl: 0 .  LORazepam (ATIVAN) 0.5 MG tablet, TAKE 1/2 (ONE-HALF) TABLET BY MOUTH AT BEDTIME (Patient taking differently: Take 0.5 mg by mouth 2 (two) times daily. TAKE 1/2 (ONE-HALF) TABLET BY MOUTH AT BEDTIME), Disp: 30 tablet, Rfl: 0 .  megestrol (MEGACE) 20 MG tablet, Take 20 mg by mouth daily., Disp: , Rfl:  .  Multiple Vitamins-Minerals (CENTRUM SILVER PO), Take 1 tablet by mouth daily., Disp: , Rfl:  .  sertraline (ZOLOFT) 50 MG tablet, Take 1 tablet (50 mg total) by mouth daily., Disp: 30 tablet, Rfl: 5 .  SYNTHROID 50 MCG tablet, Take 1 tablet (50 mcg total) by mouth daily., Disp: 90 tablet, Rfl: 3  Gen: well appearing elderly female. No acute distress. MSK: Range of motion seems fairly preserved but she does have pain with active range of motion. Skin: Moderate amounts of soft tissue swelling particularly around the dorsal aspect of the forearm near  the elbow with associated ecchymosis and discoloration.  There was no overt skin breakdown appreciated.  I could not appreciate a punctum.  No active drainage.    Assessment/ Plan: 85 y.o. female   1. Swelling of soft tissue of forearm She had gross swelling and discoloration of her forearm.  It appears from the nurses report that a DVT has been ruled out by Dopplers at the facility.  However, I cannot rule out more significant pathology including necrosis or vitamin a spider bite solely by virtual visit.  I am recommending given the extent of the swelling and discoloration that  she seek evaluation emergency department.  She may need antibiotics.  Differential diagnoses also considered includes hematoma though no preceding injury.  Both the patient and the nurse at [facility] voiced good understanding and appreciation for the phone call.   Start time: 3:29pm End time: 3:39pm  Total time spent on patient care (including telephone call/ virtual visit): 15 minutes  [place], [place] [phone]

## 2018-06-04 NOTE — ED Triage Notes (Signed)
The patient is from [place] facility. She was in bed last night and felt a sharp pain in her right arm. She got up to see if there was an insect in her bed but was unable to see anything. Redness and swelling has grown worse throughout the day.

## 2018-06-04 NOTE — Telephone Encounter (Signed)
Ok to order doppler. I am unsure how to order this since they will come to the nursing home to do it.

## 2018-06-04 NOTE — Telephone Encounter (Signed)
Order is being faxed over and needs any providers signature - states they do not have to have it signed before the come do the doppler.

## 2018-06-04 NOTE — Discharge Instructions (Addendum)
Use the ace wrap to apply light pressure to help with the swelling, you can also apply ice, the bruising should slowly resolved over the next few weeks

## 2018-06-04 NOTE — ED Provider Notes (Signed)
[facility] EMERGENCY DEPARTMENT Provider Note   CSN: [id number] Arrival date & time: 06/04/18  1646    History   Chief Complaint Chief Complaint  Patient presents with  . Arm Swelling    HPI Deborah Rivers is a 85 y.o. female.     HPI Patient presents to the ED for evaluation of right arm swelling.  Patient resides at [facility] facility.  Patient states she felt a sharp pain in her right arm last evening.  She almost felt like she was pinched or something bit her.  Today she noticed redness and swelling of her right forearm.  Patient had a telehealth visit with a provider today.  Patient had outpatient Doppler studies that were negative.  Patient was instructed to come to the ED for further evaluation based on the swelling that she had.  Patient denies any fevers or chills.  No cough or shortness of breath. Past Medical History:  Diagnosis Date  . Allergy   . Anemia   . Anxiety   . Cataract   . Depression   . Diverticulitis   . Essential hypertension   . GERD (gastroesophageal reflux disease)   . Hyperlipidemia   . Hypothyroidism   . Recurrent UTI     Patient Active Problem List   Diagnosis Date Noted  . Abdominal distension   . Acute cystitis without hematuria   . Acute renal failure (ARF) ([place]) 03/21/2018  . Acute metabolic encephalopathy 19/50/9326  . Acute lower UTI 03/21/2018  . Hypokalemia 03/21/2018  . Leg cramp 08/10/2017  . Fatigue 08/10/2017  . Hypothyroidism   . Recurrent UTI   . GERD (gastroesophageal reflux disease)   . Depression   . Cataract   . Anxiety   . Abnormal CT scan, colon 12/16/2015  . Mood disorder ([place]) 11/20/2015  . Dementia ([place]) 11/20/2015  . CKD (chronic kidney disease), stage III ([place]) 11/20/2015  . Aortic calcification ([place]) 02/26/2014  . Essential hypertension 02/26/2014  . Mixed hyperlipidemia 02/26/2014  . Anemia 07/20/2013    Past Surgical History:  Procedure Laterality Date  . APPENDECTOMY    . EYE SURGERY     . HERNIA REPAIR    . TOTAL VAGINAL HYSTERECTOMY       OB History   No obstetric history on file.      Home Medications    Prior to Admission medications   Medication Sig Start Date End Date Taking? Authorizing Provider  amLODipine (NORVASC) 5 MG tablet Take 1 tablet (5 mg total) by mouth daily. 11/04/17   Dettinger, Fransisca Kaufmann, MD  aspirin 81 MG tablet Take 1 tablet (81 mg total) by mouth daily. 03/19/18   Dettinger, Fransisca Kaufmann, MD  atorvastatin (LIPITOR) 20 MG tablet Take 1 tablet (20 mg total) by mouth daily at 6 PM. 11/04/17   Dettinger, Fransisca Kaufmann, MD  diclofenac sodium (VOLTAREN) 1 % GEL Apply 2 g topically 4 (four) times daily. 05/19/18   Evelina Dun A, FNP  ergocalciferol (VITAMIN D2) 50000 UNITS capsule Take 50,000 Units by mouth once a week. Mon    [provider]  fluticasone (FLONASE) 50 MCG/ACT nasal spray Place 2 sprays into both nostrils daily. Patient taking differently: Place 2 sprays into both nostrils daily as needed for allergies.  08/02/17   Timmothy Euler, MD  hydrocortisone cream 1 % Apply topically 3 (three) times daily as needed for itching. 03/25/18   Georgette Shell, MD  LORazepam (ATIVAN) 0.5 MG tablet TAKE 1/2 (ONE-HALF) TABLET  BY MOUTH AT BEDTIME Patient taking differently: Take 0.5 mg by mouth 2 (two) times daily. TAKE 1/2 (ONE-HALF) TABLET BY MOUTH AT BEDTIME 03/25/18   Georgette Shell, MD  megestrol (MEGACE) 20 MG tablet Take 20 mg by mouth daily. 03/17/18   [provider]  Multiple Vitamins-Minerals (CENTRUM SILVER PO) Take 1 tablet by mouth daily.    [provider]  sertraline (ZOLOFT) 50 MG tablet Take 1 tablet (50 mg total) by mouth daily. 12/26/17   Dettinger, Fransisca Kaufmann, MD  SYNTHROID 50 MCG tablet Take 1 tablet (50 mcg total) by mouth daily. 11/04/17   Dettinger, Fransisca Kaufmann, MD    Family History Family History  Problem Relation Age of Onset  . Stroke Mother   . Miscarriages / [place] Mother   . Hyperlipidemia  Mother   . Hypertension Mother   . Heart attack Father   . Hyperlipidemia Father   . Hypertension Father   . Hearing loss Father   . Miscarriages / Stillbirths Sister   . Heart attack Maternal Grandmother   . Heart attack Maternal Grandfather   . Heart attack Paternal Grandmother   . Heart attack Paternal Grandfather   . Diabetes Other   . Heart disease Other   . Kidney disease Other   . Heart attack Brother   . Cancer Sister     Social History Social History   Tobacco Use  . Smoking status: Never Smoker  . Smokeless tobacco: Never Used  Substance Use Topics  . Alcohol use: No    Alcohol/week: 0.0 standard drinks  . Drug use: No     Allergies   Sulfa antibiotics   Review of Systems Review of Systems  All other systems reviewed and are negative.    Physical Exam Updated Vital Signs BP (!) 181/87 (BP Location: Left Arm)   Pulse 82   Temp 98.3 F (36.8 C) (Oral)   Resp 20   Ht 1.524 m (5')   Wt 50.8 kg   SpO2 100%   BMI 21.87 kg/m   Physical Exam Vitals signs and nursing note reviewed.  Constitutional:      General: She is not in acute distress.    Appearance: She is well-developed.  HENT:     Head: Normocephalic and atraumatic.     Right Ear: External ear normal.     Left Ear: External ear normal.  Eyes:     General: No scleral icterus.       Right eye: No discharge.        Left eye: No discharge.     Conjunctiva/sclera: Conjunctivae normal.  Neck:     Musculoskeletal: Neck supple.     Trachea: No tracheal deviation.  Cardiovascular:     Rate and Rhythm: Normal rate and regular rhythm.  Pulmonary:     Effort: Pulmonary effort is normal. No respiratory distress.     Breath sounds: Normal breath sounds. No stridor. No wheezing or rales.  Abdominal:     General: Bowel sounds are normal. There is no distension.     Palpations: Abdomen is soft.     Tenderness: There is no abdominal tenderness. There is no guarding or rebound.  Musculoskeletal:         General: Swelling present. No tenderness.     Comments: Large superficial hematoma extending from the elbow down to the distal forearm, area is soft, not significantly tender, no erythema or increased warmth  Skin:    General: Skin is warm and  dry.     Findings: No rash.  Neurological:     Mental Status: She is alert.     Cranial Nerves: No cranial nerve deficit (no facial droop, extraocular movements intact, no slurred speech).     Sensory: No sensory deficit.     Motor: No abnormal muscle tone or seizure activity.     Coordination: Coordination normal.      ED Treatments / Results  Labs (all labs ordered are listed, but only abnormal results are displayed) Labs Reviewed  CBC - Abnormal; Notable for the following components:      Result Value   RBC 3.29 (*)    Hemoglobin 9.5 (*)    HCT 30.1 (*)    All other components within normal limits  BASIC METABOLIC PANEL - Abnormal; Notable for the following components:   Potassium 3.3 (*)    Glucose, Bld 101 (*)    BUN 32 (*)    Creatinine, Ser 1.37 (*)    GFR calc non Af Amer 34 (*)    GFR calc Af Amer 39 (*)    All other components within normal limits  PROTIME-INR    Radiology Dg Forearm Right  Result Date: 06/04/2018 CLINICAL DATA:  Right arm redness and swelling. EXAM: RIGHT FOREARM - 2 VIEW COMPARISON:  None. FINDINGS: Soft tissue swelling is noted without evidence of emphysema or radiopaque foreign body. Prominent degenerative changes are noted at the first [facility] joint. No fracture or destructive changes are seen involving the radius or ulna. The wrist and elbow are grossly located. IMPRESSION: Soft tissue swelling without acute osseous abnormality identified. Electronically Signed   By: Logan Bores M.D.   On: 06/04/2018 17:38    Procedures Procedures (including critical care time)  Medications Ordered in ED Medications - No data to display   Initial Impression / Assessment and Plan / ED Course  I have reviewed  the triage vital signs and the nursing notes.  Pertinent labs & imaging results that were available during my care of the patient were reviewed by me and considered in my medical decision making (see chart for details).  Clinical Course as of Jun 04 1850  Wed Jun 04, 2018  1812 Hemoglobin is decreased one-point from a month ago.  Patient does not have any active bleeding.   [JK]  1812 Electrolyte panel is unremarkable.   [JK]    Clinical Course User Index [JK] Dorie Rank, MD     Patient's exam is consistent with a hematoma.  I am not sure if she had spontaneous rupture of a small blood vessel or she had some type of trauma that she does not recall.  Patient swelling is all soft tissue hematoma.  There is no evidence of fracture.  Findings are not concerning for infection.  Discussed use of Ace wrap and ice.  Swelling should slowly resolve over the next several weeks.  Final Clinical Impressions(s) / ED Diagnoses   Final diagnoses:  Contusion of right upper extremity, initial encounter    ED Discharge Orders    None       Dorie Rank, MD 06/04/18 [id number]

## 2018-06-04 NOTE — ED Notes (Addendum)
Report given to [facility] the resident care coordinator at [place]. Advised her at that time that the patient would be discharged back to the facility. She stated that no one was available to pick up the patient and she would need to come via ambulance. I advised that the facility may be charged for the ambulance ride since the patient is ambulatory and does not have a medical necessity for transport.

## 2018-06-09 DIAGNOSIS — R1032 Left lower quadrant pain: Secondary | ICD-10-CM | POA: Diagnosis not present

## 2018-06-09 DIAGNOSIS — G9341 Metabolic encephalopathy: Secondary | ICD-10-CM | POA: Diagnosis not present

## 2018-06-09 DIAGNOSIS — M6281 Muscle weakness (generalized): Secondary | ICD-10-CM | POA: Diagnosis not present

## 2018-06-11 ENCOUNTER — Telehealth: Payer: Self-pay | Admitting: Family Medicine

## 2018-06-11 NOTE — Telephone Encounter (Signed)
What symptoms do you have? Severe back pain on the right lower side. Tylenol is not helping  How long have you been sick? Today  Have you been seen for this problem? Don't think so  If your provider decides to give you a prescription, which pharmacy would you like for it to be sent to? [facility]   Patient informed that this information will be sent to the clinical staff for review and that they should receive a follow up call.

## 2018-06-11 NOTE — Telephone Encounter (Signed)
Schedule her for a phone visit for tomorrow

## 2018-06-12 ENCOUNTER — Other Ambulatory Visit: Payer: Self-pay

## 2018-06-12 ENCOUNTER — Encounter: Payer: Self-pay | Admitting: Family Medicine

## 2018-06-12 ENCOUNTER — Ambulatory Visit (INDEPENDENT_AMBULATORY_CARE_PROVIDER_SITE_OTHER): Payer: Medicare Other | Admitting: Family Medicine

## 2018-06-12 DIAGNOSIS — S39012A Strain of muscle, fascia and tendon of lower back, initial encounter: Secondary | ICD-10-CM | POA: Diagnosis not present

## 2018-06-12 MED ORDER — DICLOFENAC SODIUM 1 % TD GEL: 2.0000 g | Freq: Four times a day (QID) | TRANSDERMAL | 6 refills | Status: DC

## 2018-06-12 MED ORDER — DICLOFENAC SODIUM 1 % TD GEL: 2.0000 g | Freq: Four times a day (QID) | TRANSDERMAL | 6 refills | Status: AC

## 2018-06-12 NOTE — Progress Notes (Signed)
Virtual Visit via telephone Note  I connected with Deborah Rivers on 06/12/18 at 1359 by telephone and verified that I am speaking with the correct person using two identifiers. Deborah Rivers is currently located at [facility] [employer] and nurse at [employer], Gabriel Cirri are currently with her during visit. The provider, Fransisca Kaufmann Akemi Overholser, MD is located in their office at time of visit.  Call ended at 1419  I discussed the limitations, risks, security and privacy concerns of performing an evaluation and management service by telephone and the availability of in person appointments. I also discussed with the patient that there may be a patient responsible charge related to this service. The patient expressed understanding and agreed to proceed.   History and Present Illness: Patient is complains of back pain since 06/04/2018 when she fell. She landed at her back and her neck and her back has been hurting since that time. She says that her back hurts mostly on the right side of low back.  It has strained her back because she can't use her right arm as much.  She does feel like her back bothering her a lot more recently.  She denies any numbness or weakness down either of her legs.  She denies any fevers or chills or redness or warmth.  No diagnosis found.  Outpatient Encounter Medications as of 06/12/2018  Medication Sig  . amLODipine (NORVASC) 5 MG tablet Take 1 tablet (5 mg total) by mouth daily.  Marland Kitchen aspirin 81 MG tablet Take 1 tablet (81 mg total) by mouth daily.  Marland Kitchen atorvastatin (LIPITOR) 20 MG tablet Take 1 tablet (20 mg total) by mouth daily at 6 PM.  . diclofenac sodium (VOLTAREN) 1 % GEL Apply 2 g topically 4 (four) times daily.  . ergocalciferol (VITAMIN D2) 50000 UNITS capsule Take 50,000 Units by mouth once a week. Mon  . fluticasone (FLONASE) 50 MCG/ACT nasal spray Place 2 sprays into both nostrils daily. (Patient taking differently: Place 2 sprays into both nostrils daily as needed for  allergies. )  . hydrocortisone cream 1 % Apply topically 3 (three) times daily as needed for itching.  Marland Kitchen LORazepam (ATIVAN) 0.5 MG tablet TAKE 1/2 (ONE-HALF) TABLET BY MOUTH AT BEDTIME (Patient taking differently: Take 0.5 mg by mouth 2 (two) times daily. TAKE 1/2 (ONE-HALF) TABLET BY MOUTH AT BEDTIME)  . megestrol (MEGACE) 20 MG tablet Take 20 mg by mouth daily.  . Multiple Vitamins-Minerals (CENTRUM SILVER PO) Take 1 tablet by mouth daily.  . sertraline (ZOLOFT) 50 MG tablet Take 1 tablet (50 mg total) by mouth daily.  Marland Kitchen SYNTHROID 50 MCG tablet Take 1 tablet (50 mcg total) by mouth daily.   No facility-administered encounter medications on file as of 06/12/2018.     Review of Systems  Constitutional: Negative for chills and fever.  Eyes: Negative for visual disturbance.  Respiratory: Negative for chest tightness and shortness of breath.   Cardiovascular: Negative for chest pain and leg swelling.  Musculoskeletal: Positive for back pain. Negative for arthralgias and gait problem.  Skin: Negative for rash.  Neurological: Negative for weakness, light-headedness, numbness and headaches.  Psychiatric/Behavioral: Negative for agitation and behavioral problems.  All other systems reviewed and are negative.   Observations/Objective: Patient sounds comfortable and in no acute distress, was trying to do exercises and trying to explain over the phone how to do some of the exercises.  Assessment and Plan: Problem List Items Addressed This Visit    None  Visit Diagnoses    Strain of lumbar region, initial encounter    -  Primary   Relevant Medications   diclofenac sodium (VOLTAREN) 1 % GEL       Follow Up Instructions:  Recommended stretching and exercises    I discussed the assessment and treatment plan with the patient. The patient was provided an opportunity to ask questions and all were answered. The patient agreed with the plan and demonstrated an understanding of the  instructions.   The patient was advised to call back or seek an in-person evaluation if the symptoms worsen or if the condition fails to improve as anticipated.  The above assessment and management plan was discussed with the patient. The patient verbalized understanding of and has agreed to the management plan. Patient is aware to call the clinic if symptoms persist or worsen. Patient is aware when to return to the clinic for a follow-up visit. Patient educated on when it is appropriate to go to the emergency department.    I provided 21 minutes of non-face-to-face time during this encounter.    Worthy Rancher, MD

## 2018-06-12 NOTE — Telephone Encounter (Signed)
televisit scheduled for today

## 2018-07-09 DIAGNOSIS — M6281 Muscle weakness (generalized): Secondary | ICD-10-CM | POA: Diagnosis not present

## 2018-07-09 DIAGNOSIS — G9341 Metabolic encephalopathy: Secondary | ICD-10-CM | POA: Diagnosis not present

## 2018-07-09 DIAGNOSIS — R1032 Left lower quadrant pain: Secondary | ICD-10-CM | POA: Diagnosis not present

## 2018-07-16 ENCOUNTER — Other Ambulatory Visit: Payer: Medicare Other

## 2018-07-16 ENCOUNTER — Other Ambulatory Visit: Payer: Self-pay

## 2018-07-16 ENCOUNTER — Other Ambulatory Visit: Payer: Self-pay | Admitting: *Deleted

## 2018-07-16 DIAGNOSIS — R399 Unspecified symptoms and signs involving the genitourinary system: Secondary | ICD-10-CM

## 2018-07-16 LAB — URINALYSIS, COMPLETE
Bilirubin, UA: NEGATIVE
Glucose, UA: NEGATIVE
Nitrite, UA: NEGATIVE
Protein,UA: NEGATIVE
Specific Gravity, UA: 1.02 (ref 1.005–1.030)
Urobilinogen, Ur: 0.2 mg/dL (ref 0.2–1.0)
pH, UA: 6 (ref 5.0–7.5)

## 2018-07-16 LAB — MICROSCOPIC EXAMINATION: Renal Epithel, UA: NONE SEEN /hpf

## 2018-07-17 LAB — URINE CULTURE

## 2018-07-24 ENCOUNTER — Emergency Department (HOSPITAL_COMMUNITY): Payer: Medicare Other

## 2018-07-24 ENCOUNTER — Encounter (HOSPITAL_COMMUNITY): Payer: Self-pay | Admitting: Emergency Medicine

## 2018-07-24 ENCOUNTER — Emergency Department (HOSPITAL_COMMUNITY)
Admission: EM | Admit: 2018-07-24 | Discharge: 2018-07-25 | Disposition: A | Discharge status: Home / Self Care | Payer: Medicare Other | Attending: Emergency Medicine | Admitting: Emergency Medicine

## 2018-07-24 ENCOUNTER — Other Ambulatory Visit: Payer: Self-pay

## 2018-07-24 DIAGNOSIS — Z79899 Other long term (current) drug therapy: Secondary | ICD-10-CM | POA: Diagnosis not present

## 2018-07-24 DIAGNOSIS — R404 Transient alteration of awareness: Secondary | ICD-10-CM | POA: Diagnosis not present

## 2018-07-24 DIAGNOSIS — F0390 Unspecified dementia without behavioral disturbance: Secondary | ICD-10-CM | POA: Insufficient documentation

## 2018-07-24 DIAGNOSIS — R4182 Altered mental status, unspecified: Secondary | ICD-10-CM | POA: Diagnosis present

## 2018-07-24 DIAGNOSIS — Z7982 Long term (current) use of aspirin: Secondary | ICD-10-CM | POA: Insufficient documentation

## 2018-07-24 DIAGNOSIS — I129 Hypertensive chronic kidney disease with stage 1 through stage 4 chronic kidney disease, or unspecified chronic kidney disease: Secondary | ICD-10-CM | POA: Insufficient documentation

## 2018-07-24 DIAGNOSIS — E039 Hypothyroidism, unspecified: Secondary | ICD-10-CM | POA: Diagnosis not present

## 2018-07-24 DIAGNOSIS — N183 Chronic kidney disease, stage 3 (moderate): Secondary | ICD-10-CM | POA: Insufficient documentation

## 2018-07-24 DIAGNOSIS — R410 Disorientation, unspecified: Secondary | ICD-10-CM | POA: Diagnosis not present

## 2018-07-24 DIAGNOSIS — R0689 Other abnormalities of breathing: Secondary | ICD-10-CM | POA: Diagnosis not present

## 2018-07-24 DIAGNOSIS — R4020 Unspecified coma: Secondary | ICD-10-CM | POA: Diagnosis not present

## 2018-07-24 DIAGNOSIS — E876 Hypokalemia: Secondary | ICD-10-CM | POA: Insufficient documentation

## 2018-07-24 LAB — CBC WITH DIFFERENTIAL/PLATELET
Abs Immature Granulocytes: 0.05 10*3/uL (ref 0.00–0.07)
Basophils Absolute: 0.1 10*3/uL (ref 0.0–0.1)
Basophils Relative: 1 %
Eosinophils Absolute: 0.1 10*3/uL (ref 0.0–0.5)
Eosinophils Relative: 1 %
HCT: 35.8 % — ABNORMAL LOW (ref 36.0–46.0)
Hemoglobin: 11.6 g/dL — ABNORMAL LOW (ref 12.0–15.0)
Immature Granulocytes: 0 %
Lymphocytes Relative: 5 %
Lymphs Abs: 0.6 10*3/uL — ABNORMAL LOW (ref 0.7–4.0)
MCH: 29.1 pg (ref 26.0–34.0)
MCHC: 32.4 g/dL (ref 30.0–36.0)
MCV: 89.9 fL (ref 80.0–100.0)
Monocytes Absolute: 1 10*3/uL (ref 0.1–1.0)
Monocytes Relative: 8 %
Neutro Abs: 11.1 10*3/uL — ABNORMAL HIGH (ref 1.7–7.7)
Neutrophils Relative %: 85 %
Platelets: 275 10*3/uL (ref 150–400)
RBC: 3.98 MIL/uL (ref 3.87–5.11)
RDW: 14.2 % (ref 11.5–15.5)
WBC: 13 10*3/uL — ABNORMAL HIGH (ref 4.0–10.5)
nRBC: 0 % (ref 0.0–0.2)

## 2018-07-24 LAB — URINALYSIS, ROUTINE W REFLEX MICROSCOPIC
Bacteria, UA: NONE SEEN
Bilirubin Urine: NEGATIVE
Glucose, UA: NEGATIVE mg/dL
Ketones, ur: 5 mg/dL — AB
Leukocytes,Ua: NEGATIVE
Nitrite: NEGATIVE
Protein, ur: 100 mg/dL — AB
Specific Gravity, Urine: 1.019 (ref 1.005–1.030)
pH: 6 (ref 5.0–8.0)

## 2018-07-24 LAB — COMPREHENSIVE METABOLIC PANEL
ALT: 17 U/L (ref 0–44)
AST: 24 U/L (ref 15–41)
Albumin: 4 g/dL (ref 3.5–5.0)
Alkaline Phosphatase: 56 U/L (ref 38–126)
Anion gap: 13 (ref 5–15)
BUN: 30 mg/dL — ABNORMAL HIGH (ref 8–23)
CO2: 24 mmol/L (ref 22–32)
Calcium: 9.1 mg/dL (ref 8.9–10.3)
Chloride: 105 mmol/L (ref 98–111)
Creatinine, Ser: 1.1 mg/dL — ABNORMAL HIGH (ref 0.44–1.00)
GFR calc Af Amer: 50 mL/min — ABNORMAL LOW (ref >60)
GFR calc non Af Amer: 44 mL/min — ABNORMAL LOW (ref >60)
Glucose, Bld: 97 mg/dL (ref 70–99)
Potassium: 2.9 mmol/L — ABNORMAL LOW (ref 3.5–5.1)
Sodium: 142 mmol/L (ref 135–145)
Total Bilirubin: 0.8 mg/dL (ref 0.3–1.2)
Total Protein: 6.6 g/dL (ref 6.5–8.1)

## 2018-07-24 LAB — VALPROIC ACID LEVEL: Valproic Acid Lvl: 20 ug/mL — ABNORMAL LOW (ref 50.0–100.0)

## 2018-07-24 MED ORDER — POTASSIUM CHLORIDE CRYS ER 20 MEQ PO TBCR
20.0000 meq | EXTENDED_RELEASE_TABLET | Freq: Once | ORAL | Status: AC
  Administered 2018-07-24: 20 meq via ORAL
  Filled 2018-07-24: qty 1

## 2018-07-24 MED ORDER — LORAZEPAM 0.5 MG PO TABS
0.2500 mg | ORAL_TABLET | Freq: Once | ORAL | Status: AC
  Administered 2018-07-24: 0.25 mg via ORAL
  Filled 2018-07-24: qty 1

## 2018-07-24 MED ORDER — POTASSIUM CHLORIDE CRYS ER 20 MEQ PO TBCR: 20.0000 meq | EXTENDED_RELEASE_TABLET | Freq: Two times a day (BID) | ORAL | 0 refills | Status: DC

## 2018-07-24 NOTE — ED Notes (Signed)
Patient transported to CT 

## 2018-07-24 NOTE — ED Notes (Signed)
Ambulated with contact guard very slowly to the bathroom with walker. Pt was frequently distracted yet easily redirected. Ambulated back to bed

## 2018-07-24 NOTE — Discharge Instructions (Addendum)
Your potassium level is low tonight and is being replenished with the prescription given.  Your labs and CT imaging is otherwise ok with no obvious cause for tonights event.  Get rechecked by your doctor or by returning here for any return of or worsening symptoms.

## 2018-07-24 NOTE — ED Triage Notes (Signed)
EMS states staff report history of seizure, Not noted in our history or paperwork brought to the ED

## 2018-07-24 NOTE — ED Provider Notes (Signed)
[facility] EMERGENCY DEPARTMENT Provider Note   CSN: [id number] Arrival date & time: 07/24/18  1747    History   Chief Complaint Chief Complaint  Patient presents with  . Altered Mental Status    HPI Deborah Rivers is a 85 y.o. female with a history of HTN, gerd, recurrent uti's, most recently 2/20 requiring hospitalization secondary to metabolic encephalopathy, chronic kidney disease and dementia presenting from her local skilled nursing facility with an episode of reduced responsiveness.  She was sitting in her wheelchair, slumped over and was drooling when found by nursing staff who used ammonia to arouse the patient but was then felt to be more confused than her baseline responsiveness.  Upon arrival here,  She is tearful, aware of person and place but not time, but is otherwise without complaint.   Level 5 caveat given dementia and confusion with answering questions.   Spoke with dg Jeannie per phone who states pt has increasing dementia since placement in SNF February after hospitalization for uti/encephalopathy. Endorses she is on depakote for psych reasons, she does not have seizure disorder.     The history is provided by the patient, the nursing home and a relative. The history is limited by the condition of the patient.    Past Medical History:  Diagnosis Date  . Allergy   . Anemia   . Anxiety   . Cataract   . Depression   . Diverticulitis   . Essential hypertension   . GERD (gastroesophageal reflux disease)   . Hyperlipidemia   . Hypothyroidism   . Recurrent UTI     Patient Active Problem List   Diagnosis Date Noted  . Abdominal distension   . Acute cystitis without hematuria   . Acute renal failure (ARF) ([place]) 03/21/2018  . Acute metabolic encephalopathy 02/77/4128  . Acute lower UTI 03/21/2018  . Hypokalemia 03/21/2018  . Leg cramp 08/10/2017  . Fatigue 08/10/2017  . Hypothyroidism   . Recurrent UTI   . GERD (gastroesophageal reflux disease)   .  Depression   . Cataract   . Anxiety   . Abnormal CT scan, colon 12/16/2015  . Mood disorder ([place]) 11/20/2015  . Dementia ([place]) 11/20/2015  . CKD (chronic kidney disease), stage III ([place]) 11/20/2015  . Aortic calcification ([place]) 02/26/2014  . Essential hypertension 02/26/2014  . Mixed hyperlipidemia 02/26/2014  . Anemia 07/20/2013    Past Surgical History:  Procedure Laterality Date  . APPENDECTOMY    . EYE SURGERY    . HERNIA REPAIR    . TOTAL VAGINAL HYSTERECTOMY       OB History   No obstetric history on file.      Home Medications    Prior to Admission medications   Medication Sig Start Date End Date Taking? Authorizing Provider  amLODipine (NORVASC) 5 MG tablet Take 1 tablet (5 mg total) by mouth daily. 11/04/17   Dettinger, Fransisca Kaufmann, MD  aspirin 81 MG tablet Take 1 tablet (81 mg total) by mouth daily. 03/19/18   Dettinger, Fransisca Kaufmann, MD  atorvastatin (LIPITOR) 20 MG tablet Take 1 tablet (20 mg total) by mouth daily at 6 PM. 11/04/17   Dettinger, Fransisca Kaufmann, MD  diclofenac sodium (VOLTAREN) 1 % GEL Apply 2 g topically 4 (four) times daily. 06/12/18   Dettinger, Fransisca Kaufmann, MD  ergocalciferol (VITAMIN D2) 50000 UNITS capsule Take 50,000 Units by mouth once a week. Mon    [provider]  fluticasone (FLONASE) 50 MCG/ACT nasal spray Place  2 sprays into both nostrils daily. Patient taking differently: Place 2 sprays into both nostrils daily as needed for allergies.  08/02/17   Timmothy Euler, MD  hydrocortisone cream 1 % Apply topically 3 (three) times daily as needed for itching. 03/25/18   Georgette Shell, MD  LORazepam (ATIVAN) 0.5 MG tablet TAKE 1/2 (ONE-HALF) TABLET BY MOUTH AT BEDTIME Patient taking differently: Take 0.5 mg by mouth 2 (two) times daily. TAKE 1/2 (ONE-HALF) TABLET BY MOUTH AT BEDTIME 03/25/18   Georgette Shell, MD  megestrol (MEGACE) 20 MG tablet Take 20 mg by mouth daily. 03/17/18   [provider]  Multiple Vitamins-Minerals (CENTRUM  SILVER PO) Take 1 tablet by mouth daily.    [provider]  potassium chloride SA (K-DUR) 20 MEQ tablet Take 1 tablet (20 mEq total) by mouth 2 (two) times daily. 07/24/18   Evalee Jefferson, PA-C  sertraline (ZOLOFT) 50 MG tablet Take 1 tablet (50 mg total) by mouth daily. 12/26/17   Dettinger, Fransisca Kaufmann, MD  SYNTHROID 50 MCG tablet Take 1 tablet (50 mcg total) by mouth daily. 11/04/17   Dettinger, Fransisca Kaufmann, MD    Family History Family History  Problem Relation Age of Onset  . Stroke Mother   . Miscarriages / [place] Mother   . Hyperlipidemia Mother   . Hypertension Mother   . Heart attack Father   . Hyperlipidemia Father   . Hypertension Father   . Hearing loss Father   . Miscarriages / Stillbirths Sister   . Heart attack Maternal Grandmother   . Heart attack Maternal Grandfather   . Heart attack Paternal Grandmother   . Heart attack Paternal Grandfather   . Diabetes Other   . Heart disease Other   . Kidney disease Other   . Heart attack Brother   . Cancer Sister     Social History Social History   Tobacco Use  . Smoking status: Never Smoker  . Smokeless tobacco: Never Used  Substance Use Topics  . Alcohol use: No    Alcohol/week: 0.0 standard drinks  . Drug use: No     Allergies   Sulfa antibiotics   Review of Systems Review of Systems  Unable to perform ROS: Dementia     Physical Exam Updated Vital Signs BP (!) 161/72   Pulse 83   Temp 98.3 F (36.8 C) (Axillary)   Resp 18   Wt 50.8 kg   SpO2 99%   BMI 21.87 kg/m   Physical Exam Vitals signs and nursing note reviewed.  Constitutional:      General: She is not in acute distress.    Appearance: Normal appearance. She is well-developed. She is not ill-appearing.  HENT:     Head: Normocephalic and atraumatic.     Mouth/Throat:     Mouth: Mucous membranes are moist.  Eyes:     Conjunctiva/sclera: Conjunctivae normal.  Neck:     Musculoskeletal: Normal range of motion.  Cardiovascular:      Rate and Rhythm: Normal rate and regular rhythm.     Heart sounds: Normal heart sounds.  Pulmonary:     Effort: Pulmonary effort is normal.     Breath sounds: Normal breath sounds. No wheezing.  Abdominal:     General: Bowel sounds are normal. There is no distension.     Palpations: Abdomen is soft.     Tenderness: There is no abdominal tenderness. There is no guarding.  Musculoskeletal: Normal range of motion.  Right lower leg: No edema.     Left lower leg: No edema.  Skin:    General: Skin is warm and dry.     Findings: No rash.     Comments: Old bruising noted right dorsal hand.  Neurological:     General: No focal deficit present.     Mental Status: She is alert. She is disoriented.     Cranial Nerves: No cranial nerve deficit, dysarthria or facial asymmetry.     Motor: No weakness.     Gait: Gait is intact.     Comments: Gait intact, but slow, with assistance.      ED Treatments / Results  Labs (all labs ordered are listed, but only abnormal results are displayed) Labs Reviewed  URINALYSIS, ROUTINE W REFLEX MICROSCOPIC - Abnormal; Notable for the following components:      Result Value   Color, Urine AMBER (*)    APPearance HAZY (*)    Hgb urine dipstick SMALL (*)    Ketones, ur 5 (*)    Protein, ur 100 (*)    All other components within normal limits  CBC WITH DIFFERENTIAL/PLATELET - Abnormal; Notable for the following components:   WBC 13.0 (*)    Hemoglobin 11.6 (*)    HCT 35.8 (*)    Neutro Abs 11.1 (*)    Lymphs Abs 0.6 (*)    All other components within normal limits  COMPREHENSIVE METABOLIC PANEL - Abnormal; Notable for the following components:   Potassium 2.9 (*)    BUN 30 (*)    Creatinine, Ser 1.10 (*)    GFR calc non Af Amer 44 (*)    GFR calc Af Amer 50 (*)    All other components within normal limits  VALPROIC ACID LEVEL - Abnormal; Notable for the following components:   Valproic Acid Lvl 20 (*)    All other components within normal  limits    EKG None  Radiology Ct Head Wo Contrast  Result Date: 07/24/2018 CLINICAL DATA:  85 year old female found unresponsive. Confusion. EXAM: CT HEAD WITHOUT CONTRAST TECHNIQUE: Contiguous axial images were obtained from the base of the skull through the vertex without intravenous contrast. COMPARISON:  Head CT 03/21/2018 and earlier. FINDINGS: Brain: Stable cerebral volume. Stable ventricle size and configuration. No midline shift, mass effect, or evidence of intracranial mass lesion. No acute intracranial hemorrhage identified. Stable gray-white matter differentiation throughout the brain. Confluent cerebral white matter hypodensity. Deep gray matter nuclei heterogeneity. No cortically based acute infarct identified. Vascular: Calcified atherosclerosis at the skull base. Intracranial artery tortuosity. No suspicious intracranial vascular hyperdensity. Skull: No acute osseous abnormality identified. Sinuses/Orbits: Paranasal sinuses are clear. Chronic right tympanic cavity and mastoid opacification appears stable. Left tympanic cavity and mastoids remain clear. Other: No acute orbit or scalp soft tissue findings. IMPRESSION: No acute intracranial abnormality. Stable non contrast CT appearance of the brain since January. Electronically Signed   By: Genevie Ann M.D.   On: 07/24/2018 20:59   Dg Chest Portable 1 View  Result Date: 07/24/2018 CLINICAL DATA:  Altered mental status. EXAM: PORTABLE CHEST 1 VIEW COMPARISON:  Chest x-ray dated June 04, 2003. FINDINGS: The heart size and mediastinal contours are within normal limits. Atherosclerotic calcification of the aortic arch. Normal pulmonary vascularity. No focal consolidation, pleural effusion, or pneumothorax. No acute osseous abnormality. IMPRESSION: 1. No active disease. 2.  Aortic atherosclerosis (ICD10-I70.0). Electronically Signed   By: Titus Dubin M.D.   On: 07/24/2018 19:39    Procedures  Procedures (including critical care time)   Medications Ordered in ED Medications  LORazepam (ATIVAN) tablet 0.25 mg (0.25 mg Oral Given 07/24/18 2312)  potassium chloride SA (K-DUR) CR tablet 20 mEq (20 mEq Oral Given 07/24/18 2312)     Initial Impression / Assessment and Plan / ED Course  I have reviewed the triage vital signs and the nursing notes.  Pertinent labs & imaging results that were available during my care of the patient were reviewed by me and considered in my medical decision making (see chart for details).        Altered mental status of unclear etiology. Pt does have an elevated wbc count, but no source of infection on todays workup, possibly stress reaction.  She has some confusion here, no neuro deficit suggesting cva.  Hypokalemia, supplementation provided.  Discussed w/u findings with dg per phone, considered overnight observation given unclear etiology of event.  ekg   Admission not without risks of infection exposure.  Opted for return to the snf with planned recheck here with any return of sx.   Final Clinical Impressions(s) / ED Diagnoses   Final diagnoses:  Altered mental status, unspecified altered mental status type  Hypokalemia    ED Discharge Orders         Ordered    potassium chloride SA (K-DUR) 20 MEQ tablet  2 times daily     07/24/18 2305           Evalee Jefferson, PA-C 07/24/18 2359    Long, Wonda [facility], MD 07/25/18 1344

## 2018-07-24 NOTE — ED Triage Notes (Signed)
Patient from [facility], staff found slumped over in chair unresponsive,  BS 131, used ammonia to arouse, pt confused   per EMS. EKG NSR. Patient is normally walking in hallway. Patient alert to self and place on arrival. Tearful, denies pain

## 2018-07-25 DIAGNOSIS — R0902 Hypoxemia: Secondary | ICD-10-CM | POA: Diagnosis not present

## 2018-07-25 DIAGNOSIS — Z7401 Bed confinement status: Secondary | ICD-10-CM | POA: Diagnosis not present

## 2018-07-25 DIAGNOSIS — I10 Essential (primary) hypertension: Secondary | ICD-10-CM | POA: Diagnosis not present

## 2018-07-27 DIAGNOSIS — W19XXXA Unspecified fall, initial encounter: Secondary | ICD-10-CM | POA: Diagnosis not present

## 2018-07-27 DIAGNOSIS — I10 Essential (primary) hypertension: Secondary | ICD-10-CM | POA: Diagnosis not present

## 2018-07-27 DIAGNOSIS — R410 Disorientation, unspecified: Secondary | ICD-10-CM | POA: Diagnosis not present

## 2018-07-27 DIAGNOSIS — R0902 Hypoxemia: Secondary | ICD-10-CM | POA: Diagnosis not present

## 2018-07-28 ENCOUNTER — Emergency Department (HOSPITAL_COMMUNITY): Payer: Medicare Other

## 2018-07-28 ENCOUNTER — Other Ambulatory Visit: Payer: Self-pay

## 2018-07-28 ENCOUNTER — Emergency Department (HOSPITAL_COMMUNITY)
Admission: EM | Admit: 2018-07-28 | Discharge: 2018-07-28 | Disposition: A | Discharge status: Home / Self Care | Payer: Medicare Other | Attending: Emergency Medicine | Admitting: Emergency Medicine

## 2018-07-28 ENCOUNTER — Encounter (HOSPITAL_COMMUNITY): Payer: Self-pay | Admitting: Emergency Medicine

## 2018-07-28 DIAGNOSIS — Z23 Encounter for immunization: Secondary | ICD-10-CM | POA: Insufficient documentation

## 2018-07-28 DIAGNOSIS — N183 Chronic kidney disease, stage 3 (moderate): Secondary | ICD-10-CM | POA: Diagnosis not present

## 2018-07-28 DIAGNOSIS — Z7982 Long term (current) use of aspirin: Secondary | ICD-10-CM | POA: Insufficient documentation

## 2018-07-28 DIAGNOSIS — S60222A Contusion of left hand, initial encounter: Secondary | ICD-10-CM | POA: Diagnosis not present

## 2018-07-28 DIAGNOSIS — S6992XA Unspecified injury of left wrist, hand and finger(s), initial encounter: Secondary | ICD-10-CM | POA: Diagnosis not present

## 2018-07-28 DIAGNOSIS — S3993XA Unspecified injury of pelvis, initial encounter: Secondary | ICD-10-CM | POA: Diagnosis not present

## 2018-07-28 DIAGNOSIS — S199XXA Unspecified injury of neck, initial encounter: Secondary | ICD-10-CM | POA: Diagnosis not present

## 2018-07-28 DIAGNOSIS — S0990XA Unspecified injury of head, initial encounter: Secondary | ICD-10-CM | POA: Diagnosis not present

## 2018-07-28 DIAGNOSIS — M7989 Other specified soft tissue disorders: Secondary | ICD-10-CM | POA: Diagnosis not present

## 2018-07-28 DIAGNOSIS — S41112A Laceration without foreign body of left upper arm, initial encounter: Secondary | ICD-10-CM | POA: Diagnosis not present

## 2018-07-28 DIAGNOSIS — S59902A Unspecified injury of left elbow, initial encounter: Secondary | ICD-10-CM | POA: Diagnosis not present

## 2018-07-28 DIAGNOSIS — W19XXXA Unspecified fall, initial encounter: Secondary | ICD-10-CM | POA: Diagnosis not present

## 2018-07-28 DIAGNOSIS — Z79899 Other long term (current) drug therapy: Secondary | ICD-10-CM | POA: Insufficient documentation

## 2018-07-28 DIAGNOSIS — S0083XA Contusion of other part of head, initial encounter: Secondary | ICD-10-CM | POA: Insufficient documentation

## 2018-07-28 DIAGNOSIS — Y92129 Unspecified place in nursing home as the place of occurrence of the external cause: Secondary | ICD-10-CM | POA: Diagnosis not present

## 2018-07-28 DIAGNOSIS — T07XXXA Unspecified multiple injuries, initial encounter: Secondary | ICD-10-CM

## 2018-07-28 DIAGNOSIS — I129 Hypertensive chronic kidney disease with stage 1 through stage 4 chronic kidney disease, or unspecified chronic kidney disease: Secondary | ICD-10-CM | POA: Insufficient documentation

## 2018-07-28 DIAGNOSIS — W1830XA Fall on same level, unspecified, initial encounter: Secondary | ICD-10-CM | POA: Insufficient documentation

## 2018-07-28 DIAGNOSIS — S60212A Contusion of left wrist, initial encounter: Secondary | ICD-10-CM | POA: Insufficient documentation

## 2018-07-28 DIAGNOSIS — E039 Hypothyroidism, unspecified: Secondary | ICD-10-CM | POA: Diagnosis not present

## 2018-07-28 DIAGNOSIS — M25521 Pain in right elbow: Secondary | ICD-10-CM | POA: Diagnosis not present

## 2018-07-28 DIAGNOSIS — S51812A Laceration without foreign body of left forearm, initial encounter: Secondary | ICD-10-CM | POA: Diagnosis not present

## 2018-07-28 DIAGNOSIS — R079 Chest pain, unspecified: Secondary | ICD-10-CM | POA: Diagnosis not present

## 2018-07-28 DIAGNOSIS — S4992XA Unspecified injury of left shoulder and upper arm, initial encounter: Secondary | ICD-10-CM | POA: Diagnosis not present

## 2018-07-28 DIAGNOSIS — S40012A Contusion of left shoulder, initial encounter: Secondary | ICD-10-CM | POA: Insufficient documentation

## 2018-07-28 DIAGNOSIS — Z7401 Bed confinement status: Secondary | ICD-10-CM | POA: Diagnosis not present

## 2018-07-28 DIAGNOSIS — S51011A Laceration without foreign body of right elbow, initial encounter: Secondary | ICD-10-CM | POA: Diagnosis not present

## 2018-07-28 DIAGNOSIS — S0993XA Unspecified injury of face, initial encounter: Secondary | ICD-10-CM | POA: Diagnosis not present

## 2018-07-28 DIAGNOSIS — Y939 Activity, unspecified: Secondary | ICD-10-CM | POA: Insufficient documentation

## 2018-07-28 DIAGNOSIS — Y999 Unspecified external cause status: Secondary | ICD-10-CM | POA: Insufficient documentation

## 2018-07-28 DIAGNOSIS — I10 Essential (primary) hypertension: Secondary | ICD-10-CM | POA: Diagnosis not present

## 2018-07-28 DIAGNOSIS — M19042 Primary osteoarthritis, left hand: Secondary | ICD-10-CM | POA: Diagnosis not present

## 2018-07-28 LAB — URINALYSIS, ROUTINE W REFLEX MICROSCOPIC
Bacteria, UA: NONE SEEN
Bilirubin Urine: NEGATIVE
Glucose, UA: NEGATIVE mg/dL
Ketones, ur: NEGATIVE mg/dL
Leukocytes,Ua: NEGATIVE
Nitrite: NEGATIVE
Protein, ur: NEGATIVE mg/dL
Specific Gravity, Urine: 1.02 (ref 1.005–1.030)
pH: 5 (ref 5.0–8.0)

## 2018-07-28 LAB — CBC WITH DIFFERENTIAL/PLATELET
Abs Immature Granulocytes: 0.04 10*3/uL (ref 0.00–0.07)
Basophils Absolute: 0.1 10*3/uL (ref 0.0–0.1)
Basophils Relative: 1 %
Eosinophils Absolute: 0.3 10*3/uL (ref 0.0–0.5)
Eosinophils Relative: 3 %
HCT: 36 % (ref 36.0–46.0)
Hemoglobin: 11.7 g/dL — ABNORMAL LOW (ref 12.0–15.0)
Immature Granulocytes: 0 %
Lymphocytes Relative: 13 %
Lymphs Abs: 1.3 10*3/uL (ref 0.7–4.0)
MCH: 29.3 pg (ref 26.0–34.0)
MCHC: 32.5 g/dL (ref 30.0–36.0)
MCV: 90 fL (ref 80.0–100.0)
Monocytes Absolute: 1 10*3/uL (ref 0.1–1.0)
Monocytes Relative: 10 %
Neutro Abs: 7.3 10*3/uL (ref 1.7–7.7)
Neutrophils Relative %: 73 %
Platelets: 278 10*3/uL (ref 150–400)
RBC: 4 MIL/uL (ref 3.87–5.11)
RDW: 14.2 % (ref 11.5–15.5)
WBC: 9.9 10*3/uL (ref 4.0–10.5)
nRBC: 0 % (ref 0.0–0.2)

## 2018-07-28 LAB — COMPREHENSIVE METABOLIC PANEL
ALT: 21 U/L (ref 0–44)
AST: 29 U/L (ref 15–41)
Albumin: 4.4 g/dL (ref 3.5–5.0)
Alkaline Phosphatase: 66 U/L (ref 38–126)
Anion gap: 12 (ref 5–15)
BUN: 45 mg/dL — ABNORMAL HIGH (ref 8–23)
CO2: 24 mmol/L (ref 22–32)
Calcium: 9.6 mg/dL (ref 8.9–10.3)
Chloride: 106 mmol/L (ref 98–111)
Creatinine, Ser: 1.29 mg/dL — ABNORMAL HIGH (ref 0.44–1.00)
GFR calc Af Amer: 42 mL/min — ABNORMAL LOW (ref >60)
GFR calc non Af Amer: 36 mL/min — ABNORMAL LOW (ref >60)
Glucose, Bld: 101 mg/dL — ABNORMAL HIGH (ref 70–99)
Potassium: 3.6 mmol/L (ref 3.5–5.1)
Sodium: 142 mmol/L (ref 135–145)
Total Bilirubin: 0.8 mg/dL (ref 0.3–1.2)
Total Protein: 7.5 g/dL (ref 6.5–8.1)

## 2018-07-28 LAB — VALPROIC ACID LEVEL: Valproic Acid Lvl: 49 ug/mL — ABNORMAL LOW (ref 50.0–100.0)

## 2018-07-28 MED ORDER — SODIUM CHLORIDE 0.9 % IV BOLUS
500.0000 mL | Freq: Once | INTRAVENOUS | Status: AC
  Administered 2018-07-28: 500 mL via INTRAVENOUS

## 2018-07-28 MED ORDER — TETANUS-DIPHTH-ACELL PERTUSSIS 5-2.5-18.5 LF-MCG/0.5 IM SUSP
0.5000 mL | Freq: Once | INTRAMUSCULAR | Status: AC
  Administered 2018-07-28: 0.5 mL via INTRAMUSCULAR
  Filled 2018-07-28: qty 0.5

## 2018-07-28 NOTE — ED Provider Notes (Signed)
[facility] EMERGENCY DEPARTMENT Provider Note   CSN: [id number] Arrival date & time: 07/28/18  0005    History   Chief Complaint Chief Complaint  Patient presents with   Fall    HPI Deborah Rivers is a 85 y.o. female.     Patient brought by EMS after unwitnessed fall at her living facility.  She was found on the ground with a skin tear to her left arm and bruising of her left shoulder and left arm.  Patient cannot give a history does not know what happened.  She is confused but oriented to person. She believes that [place] is [place] the police [place].  She denies any pain.  Patient was seen in the ED 3 days ago after episode of decreased responsiveness and was discharged back to her facility.  Level 5 caveat for dementia and confusion.  D/w daughter Edmonia Lynch by phone.  She was aware of the fall tonight and she was also called earlier in the day about a previous fall.  Patient is in assisted living and supposed to be in a wheelchair but continues to get up on her own.  Her daughter feels her confusion is at baseline.  The history is provided by the patient and the EMS personnel. The history is limited by the condition of the patient.  Fall  Pertinent negatives include no abdominal pain, no headaches and no shortness of breath.    Past Medical History:  Diagnosis Date   Allergy    Anemia    Anxiety    Cataract    Depression    Diverticulitis    Essential hypertension    GERD (gastroesophageal reflux disease)    Hyperlipidemia    Hypothyroidism    Recurrent UTI     Patient Active Problem List   Diagnosis Date Noted   Abdominal distension    Acute cystitis without hematuria    Acute renal failure (ARF) ([place]) 13/24/4010   Acute metabolic encephalopathy 27/25/3664   Acute lower UTI 03/21/2018   Hypokalemia 03/21/2018   Leg cramp 08/10/2017   Fatigue 08/10/2017   Hypothyroidism    Recurrent UTI    GERD (gastroesophageal reflux disease)    Depression     Cataract    Anxiety    Abnormal CT scan, colon 12/16/2015   Mood disorder ([place]) 11/20/2015   Dementia ([place]) 11/20/2015   CKD (chronic kidney disease), stage III ([place]) 11/20/2015   Aortic calcification ([place]) 02/26/2014   Essential hypertension 02/26/2014   Mixed hyperlipidemia 02/26/2014   Anemia 07/20/2013    Past Surgical History:  Procedure Laterality Date   APPENDECTOMY     EYE SURGERY     HERNIA REPAIR     TOTAL VAGINAL HYSTERECTOMY       OB History   No obstetric history on file.      Home Medications    Prior to Admission medications   Medication Sig Start Date End Date Taking? Authorizing Provider  amLODipine (NORVASC) 5 MG tablet Take 1 tablet (5 mg total) by mouth daily. 11/04/17   Dettinger, Fransisca Kaufmann, MD  aspirin 81 MG tablet Take 1 tablet (81 mg total) by mouth daily. 03/19/18   Dettinger, Fransisca Kaufmann, MD  atorvastatin (LIPITOR) 20 MG tablet Take 1 tablet (20 mg total) by mouth daily at 6 PM. 11/04/17   Dettinger, Fransisca Kaufmann, MD  diclofenac sodium (VOLTAREN) 1 % GEL Apply 2 g topically 4 (four) times daily. 06/12/18   Dettinger, Fransisca Kaufmann, MD  ergocalciferol (VITAMIN  D2) 50000 UNITS capsule Take 50,000 Units by mouth once a week. Mon    [provider]  fluticasone (FLONASE) 50 MCG/ACT nasal spray Place 2 sprays into both nostrils daily. Patient taking differently: Place 2 sprays into both nostrils daily as needed for allergies.  08/02/17   Timmothy Euler, MD  hydrocortisone cream 1 % Apply topically 3 (three) times daily as needed for itching. 03/25/18   Georgette Shell, MD  LORazepam (ATIVAN) 0.5 MG tablet TAKE 1/2 (ONE-HALF) TABLET BY MOUTH AT BEDTIME Patient taking differently: Take 0.5 mg by mouth 2 (two) times daily. TAKE 1/2 (ONE-HALF) TABLET BY MOUTH AT BEDTIME 03/25/18   Georgette Shell, MD  megestrol (MEGACE) 20 MG tablet Take 20 mg by mouth daily. 03/17/18   [provider]  Multiple Vitamins-Minerals (CENTRUM SILVER PO)  Take 1 tablet by mouth daily.    [provider]  potassium chloride SA (K-DUR) 20 MEQ tablet Take 1 tablet (20 mEq total) by mouth 2 (two) times daily. 07/24/18   Evalee Jefferson, PA-C  sertraline (ZOLOFT) 50 MG tablet Take 1 tablet (50 mg total) by mouth daily. 12/26/17   Dettinger, Fransisca Kaufmann, MD  SYNTHROID 50 MCG tablet Take 1 tablet (50 mcg total) by mouth daily. 11/04/17   Dettinger, Fransisca Kaufmann, MD    Family History Family History  Problem Relation Age of Onset   Stroke Mother    Miscarriages / [place] Mother    Hyperlipidemia Mother    Hypertension Mother    Heart attack Father    Hyperlipidemia Father    Hypertension Father    Hearing loss Father    Miscarriages / Stillbirths Sister    Heart attack Maternal Grandmother    Heart attack Maternal Grandfather    Heart attack Paternal Grandmother    Heart attack Paternal Grandfather    Diabetes Other    Heart disease Other    Kidney disease Other    Heart attack Brother    Cancer Sister     Social History Social History   Tobacco Use   Smoking status: Never Smoker   Smokeless tobacco: Never Used  Substance Use Topics   Alcohol use: No    Alcohol/week: 0.0 standard drinks   Drug use: No     Allergies   Sulfa antibiotics   Review of Systems Review of Systems  HENT: Negative for congestion and rhinorrhea.   Respiratory: Negative for cough and shortness of breath.   Gastrointestinal: Negative for abdominal pain, nausea and vomiting.  Musculoskeletal: Positive for arthralgias and myalgias. Negative for back pain and neck pain.  Skin: Positive for wound.  Neurological: Negative for weakness and headaches.   all other systems are negative except as noted in the HPI and PMH.     Physical Exam Updated Vital Signs BP (!) 158/77 (BP Location: Right Arm)    Pulse 81    Temp 98.1 F (36.7 C) (Oral)    Resp 16    Ht 5\' 3"  (1.6 m)    Wt 50 kg    SpO2 98%    BMI 19.53 kg/m   Physical  Exam Vitals signs and nursing note reviewed.  Constitutional:      General: She is not in acute distress.    Appearance: She is well-developed.  HENT:     Head: Normocephalic and atraumatic.     Comments: Ecchymosis to left cheek  No hematomas to scalp.    Mouth/Throat:     Pharynx: No oropharyngeal  exudate.  Eyes:     Conjunctiva/sclera: Conjunctivae normal.     Pupils: Pupils are equal, round, and reactive to light.  Neck:     Musculoskeletal: Normal range of motion and neck supple.     Comments:  No C-spine tenderness Cardiovascular:     Rate and Rhythm: Normal rate and regular rhythm.     Heart sounds: Normal heart sounds. No murmur.  Pulmonary:     Effort: Pulmonary effort is normal. No respiratory distress.     Breath sounds: Normal breath sounds.  Abdominal:     Palpations: Abdomen is soft.     Tenderness: There is no abdominal tenderness. There is no guarding or rebound.  Musculoskeletal: Normal range of motion.        General: Signs of injury present. No tenderness or deformity.     Comments: Full range of motion of hips without pain.  Extensive bruising to anterior and posterior left shoulder.  Large skin tear to left lateral upper arm. Ecchymosis to left hand and wrist.  Hematoma and skin tear to right elbow  Full range of motion of bilateral shoulder, elbow and wrist without pain  No T or L-spine tenderness  Full range of motion of hips without pain  Skin:    General: Skin is warm.     Capillary Refill: Capillary refill takes less than 2 seconds.     Findings: Bruising present.  Neurological:     General: No focal deficit present.     Mental Status: She is alert. Mental status is at baseline.     Cranial Nerves: No cranial nerve deficit.     Motor: No abnormal muscle tone.     Coordination: Coordination normal.     Comments: Oriented to person and place.  She moves all extremities equally.  Psychiatric:        Behavior: Behavior normal.      ED  Treatments / Results  Labs (all labs ordered are listed, but only abnormal results are displayed) Labs Reviewed  CBC WITH DIFFERENTIAL/PLATELET - Abnormal; Notable for the following components:      Result Value   Hemoglobin 11.7 (*)    All other components within normal limits  COMPREHENSIVE METABOLIC PANEL - Abnormal; Notable for the following components:   Glucose, Bld 101 (*)    BUN 45 (*)    Creatinine, Ser 1.29 (*)    GFR calc non Af Amer 36 (*)    GFR calc Af Amer 42 (*)    All other components within normal limits  URINALYSIS, ROUTINE W REFLEX MICROSCOPIC - Abnormal; Notable for the following components:   Hgb urine dipstick SMALL (*)    All other components within normal limits  VALPROIC ACID LEVEL - Abnormal; Notable for the following components:   Valproic Acid Lvl 49 (*)    All other components within normal limits    EKG EKG Interpretation  Date/Time:  Monday July 28 2018 00:29:45 EDT Ventricular Rate:  84 PR Interval:    QRS Duration: 98 QT Interval:  423 QTC Calculation: 482 R Axis:   -28 Text Interpretation:  Normal sinus rhythm Artifact PACs Ventricular premature complex Abnormal R-wave progression, early transition Inferior infarct, old Confirmed by Ezequiel Essex [phone]) on 07/28/2018 1:42:33 AM   Radiology Dg Chest 1 View  Result Date: 07/28/2018 CLINICAL DATA:  85 year old female status post unwitnessed fall with confusion and pain. EXAM: CHEST  1 VIEW COMPARISON:  Portable chest 07/24/2018. FINDINGS: AP upright view at [facility]  hours. Larger lung volumes. Stable cardiac size and mediastinal contours. Extensive Calcified aortic atherosclerosis. Visualized tracheal air column is within normal limits. No pneumothorax, pulmonary edema, pleural effusion or confluent pulmonary opacity. No acute osseous abnormality identified. IMPRESSION: No acute cardiopulmonary abnormality or acute traumatic injury identified. Electronically Signed   By: Genevie Ann M.D.   On:  07/28/2018 01:28   Dg Pelvis 1-2 Views  Result Date: 07/28/2018 CLINICAL DATA:  85 year old female status post unwitnessed fall with confusion and pain. EXAM: PELVIS - 1-2 VIEW COMPARISON:  CT Abdomen and Pelvis 04/09/2016. FINDINGS: Femoral heads are normally located. No proximal femur fracture is evident. No pelvis fracture identified. Negative visible bowel gas pattern. Chronic pelvic phleboliths. IMPRESSION: No acute fracture or dislocation identified about the pelvis. If there is lateralizing hip pain recommend dedicated hip series. Electronically Signed   By: Genevie Ann M.D.   On: 07/28/2018 01:27   Dg Elbow 2 Views Left  Result Date: 07/28/2018 CLINICAL DATA:  85 year old female status post unwitnessed fall with confusion and pain. EXAM: LEFT ELBOW - 2 VIEW COMPARISON:  Left humerus series today. FINDINGS: There is no evidence of fracture, dislocation, or joint effusion. Mild for age degenerative changes. No discrete soft tissue injury. IMPRESSION: No acute fracture or dislocation identified about the left elbow. Electronically Signed   By: Genevie Ann M.D.   On: 07/28/2018 01:22   Dg Elbow Complete Right  Result Date: 07/28/2018 CLINICAL DATA:  85 year old female status post unwitnessed fall with confusion and pain. EXAM: RIGHT ELBOW - COMPLETE 3+ VIEW COMPARISON:  Right forearm 06/04/2018. FINDINGS: Four views. Antecubital fossa IV access incidentally noted. Bone mineralization is within normal limits for age. Preserved joint spaces and alignment. The radial head appears intact. No fracture or dislocation identified. There is soft tissue swelling in the proximal forearm. IMPRESSION: Soft tissue swelling. No acute fracture or dislocation identified about the right elbow. Electronically Signed   By: Genevie Ann M.D.   On: 07/28/2018 01:26   Dg Wrist Complete Left  Result Date: 07/28/2018 CLINICAL DATA:  85 year old female status post unwitnessed fall with confusion and pain. EXAM: LEFT WRIST - COMPLETE 3+  VIEW COMPARISON:  Left wrist series 04/25/2016. FINDINGS: Chronic severe 1st CMC osteoarthritis. Stable joint spaces and alignment. Stable bone mineralization. Increased osteophytosis at the base of the 3rd metatarsal, but no acute fracture or dislocation identified. There is soft tissue swelling over the radial aspect of the distal forearm. IMPRESSION: Soft tissue injury with no acute fracture or dislocation identified about the left wrist. Electronically Signed   By: Genevie Ann M.D.   On: 07/28/2018 01:23   Ct Head Wo Contrast  Result Date: 07/28/2018 CLINICAL DATA:  85 year old female status post unwitnessed fall with confusion and pain. EXAM: CT HEAD WITHOUT CONTRAST CT MAXILLOFACIAL WITHOUT CONTRAST CT CERVICAL SPINE WITHOUT CONTRAST TECHNIQUE: Multidetector CT imaging of the head, cervical spine, and maxillofacial structures were performed using the standard protocol without intravenous contrast. Multiplanar CT image reconstructions of the cervical spine and maxillofacial structures were also generated. COMPARISON:  CT head 07/24/2018 and earlier. Cervical spine radiographs 01/11/2016, MRI 08/10/2015. FINDINGS: CT HEAD FINDINGS Brain: Mild motion artifact today. Mild asymmetric extra-axial CSF along the left convexity appears stable. Stable cerebral volume. Stable ventricle size and configuration. Patchy and confluent hypodensity in the cerebral white matter and deep gray matter nuclei. No cortically based acute infarct identified. No acute intracranial hemorrhage identified. Vascular: Calcified atherosclerosis at the skull base. No suspicious intracranial vascular hyperdensity. Skull:  No calvarium fracture identified. Other: No acute scalp soft tissue finding. CT MAXILLOFACIAL FINDINGS Osseous: Intact mandible. No maxilla or zygoma fracture. Nasal bones appear stable since January. Central skull base appears intact. Orbits: Intact orbital walls. Stable orbits soft tissues. Sinuses: Paranasal sinuses are  stable in clear. Chronic right tympanic cavity and mastoid opacification. Left tympanic cavity and mastoids remain clear. Soft tissues: Negative visible noncontrast larynx, pharynx, parapharyngeal spaces, retropharyngeal space, sublingual space, submandibular spaces, masticator and parotid spaces. CT CERVICAL SPINE FINDINGS Alignment: Stable since 2017. Mild degenerative appearing anterolisthesis at C3-C4, C4-C5, C7-T1. Bilateral posterior element alignment is within normal limits. Skull base and vertebrae: Visualized skull base is intact. No atlanto-occipital dissociation. No acute osseous abnormality identified. The entire anterior C7-T1 junction is not included. Soft tissues and spinal canal: No prevertebral fluid or swelling. No visible canal hematoma. Capacious spinal canal. Negative noncontrast neck soft tissues. Disc levels: Chronic facet ankylosis on the right at C7-T1. Stable degenerative changes since the 2017 MRI. Upper chest: Only a portion of T1 is included, and appears grossly intact. IMPRESSION: 1. No acute intracranial abnormality. Stable non contrast CT appearance of the brain. 2. No acute facial fracture. Chronic right tympanic cavity and mastoid opacification. 3. No acute traumatic injury identified in the cervical spine. Electronically Signed   By: Genevie Ann M.D.   On: 07/28/2018 01:38   Ct Cervical Spine Wo Contrast  Result Date: 07/28/2018 CLINICAL DATA:  85 year old female status post unwitnessed fall with confusion and pain. EXAM: CT HEAD WITHOUT CONTRAST CT MAXILLOFACIAL WITHOUT CONTRAST CT CERVICAL SPINE WITHOUT CONTRAST TECHNIQUE: Multidetector CT imaging of the head, cervical spine, and maxillofacial structures were performed using the standard protocol without intravenous contrast. Multiplanar CT image reconstructions of the cervical spine and maxillofacial structures were also generated. COMPARISON:  CT head 07/24/2018 and earlier. Cervical spine radiographs 01/11/2016, MRI 08/10/2015.  FINDINGS: CT HEAD FINDINGS Brain: Mild motion artifact today. Mild asymmetric extra-axial CSF along the left convexity appears stable. Stable cerebral volume. Stable ventricle size and configuration. Patchy and confluent hypodensity in the cerebral white matter and deep gray matter nuclei. No cortically based acute infarct identified. No acute intracranial hemorrhage identified. Vascular: Calcified atherosclerosis at the skull base. No suspicious intracranial vascular hyperdensity. Skull: No calvarium fracture identified. Other: No acute scalp soft tissue finding. CT MAXILLOFACIAL FINDINGS Osseous: Intact mandible. No maxilla or zygoma fracture. Nasal bones appear stable since January. Central skull base appears intact. Orbits: Intact orbital walls. Stable orbits soft tissues. Sinuses: Paranasal sinuses are stable in clear. Chronic right tympanic cavity and mastoid opacification. Left tympanic cavity and mastoids remain clear. Soft tissues: Negative visible noncontrast larynx, pharynx, parapharyngeal spaces, retropharyngeal space, sublingual space, submandibular spaces, masticator and parotid spaces. CT CERVICAL SPINE FINDINGS Alignment: Stable since 2017. Mild degenerative appearing anterolisthesis at C3-C4, C4-C5, C7-T1. Bilateral posterior element alignment is within normal limits. Skull base and vertebrae: Visualized skull base is intact. No atlanto-occipital dissociation. No acute osseous abnormality identified. The entire anterior C7-T1 junction is not included. Soft tissues and spinal canal: No prevertebral fluid or swelling. No visible canal hematoma. Capacious spinal canal. Negative noncontrast neck soft tissues. Disc levels: Chronic facet ankylosis on the right at C7-T1. Stable degenerative changes since the 2017 MRI. Upper chest: Only a portion of T1 is included, and appears grossly intact. IMPRESSION: 1. No acute intracranial abnormality. Stable non contrast CT appearance of the brain. 2. No acute facial  fracture. Chronic right tympanic cavity and mastoid opacification. 3. No acute traumatic injury identified  in the cervical spine. Electronically Signed   By: Genevie Ann M.D.   On: 07/28/2018 01:38   Dg Shoulder Left  Result Date: 07/28/2018 CLINICAL DATA:  85 year old female status post unwitnessed fall with confusion and pain. EXAM: LEFT SHOULDER - 2+ VIEW COMPARISON:  Chest radiographs 06/04/2003. FINDINGS: Two views. No glenohumeral joint dislocation. Bone mineralization is within normal limits for age. Proximal left humerus, visible left clavicle and scapula appear intact. Negative visible left chest. IMPRESSION: No acute fracture or dislocation identified about the left shoulder. Electronically Signed   By: Genevie Ann M.D.   On: 07/28/2018 01:21   Dg Humerus Left  Result Date: 07/28/2018 CLINICAL DATA:  85 year old female status post unwitnessed fall with confusion and pain. EXAM: LEFT HUMERUS - 2+ VIEW COMPARISON:  Left shoulder series today. FINDINGS: There is no evidence of fracture or other focal bone lesions. No discrete soft tissue injury. Alignment appears preserved at the left elbow. IMPRESSION: No acute fracture or dislocation identified about the left humerus. Electronically Signed   By: Genevie Ann M.D.   On: 07/28/2018 01:21   Dg Hand Complete Left  Result Date: 07/28/2018 CLINICAL DATA:  85 year old female status post unwitnessed fall with confusion and pain. EXAM: LEFT HAND - COMPLETE 3+ VIEW COMPARISON:  Left wrist series today.  Left hand series 04/25/2016. FINDINGS: Proximal metacarpal findings are reported separately today. MCP joints and alignment preserved. Chronic IP osteoarthritis. Osteopenia. No acute fracture or dislocation identified. IMPRESSION: Chronic osteoarthritis with some no acute fracture or dislocation identified. Electronically Signed   By: Genevie Ann M.D.   On: 07/28/2018 01:25   Ct Maxillofacial Wo Contrast  Result Date: 07/28/2018 CLINICAL DATA:  85 year old female status  post unwitnessed fall with confusion and pain. EXAM: CT HEAD WITHOUT CONTRAST CT MAXILLOFACIAL WITHOUT CONTRAST CT CERVICAL SPINE WITHOUT CONTRAST TECHNIQUE: Multidetector CT imaging of the head, cervical spine, and maxillofacial structures were performed using the standard protocol without intravenous contrast. Multiplanar CT image reconstructions of the cervical spine and maxillofacial structures were also generated. COMPARISON:  CT head 07/24/2018 and earlier. Cervical spine radiographs 01/11/2016, MRI 08/10/2015. FINDINGS: CT HEAD FINDINGS Brain: Mild motion artifact today. Mild asymmetric extra-axial CSF along the left convexity appears stable. Stable cerebral volume. Stable ventricle size and configuration. Patchy and confluent hypodensity in the cerebral white matter and deep gray matter nuclei. No cortically based acute infarct identified. No acute intracranial hemorrhage identified. Vascular: Calcified atherosclerosis at the skull base. No suspicious intracranial vascular hyperdensity. Skull: No calvarium fracture identified. Other: No acute scalp soft tissue finding. CT MAXILLOFACIAL FINDINGS Osseous: Intact mandible. No maxilla or zygoma fracture. Nasal bones appear stable since January. Central skull base appears intact. Orbits: Intact orbital walls. Stable orbits soft tissues. Sinuses: Paranasal sinuses are stable in clear. Chronic right tympanic cavity and mastoid opacification. Left tympanic cavity and mastoids remain clear. Soft tissues: Negative visible noncontrast larynx, pharynx, parapharyngeal spaces, retropharyngeal space, sublingual space, submandibular spaces, masticator and parotid spaces. CT CERVICAL SPINE FINDINGS Alignment: Stable since 2017. Mild degenerative appearing anterolisthesis at C3-C4, C4-C5, C7-T1. Bilateral posterior element alignment is within normal limits. Skull base and vertebrae: Visualized skull base is intact. No atlanto-occipital dissociation. No acute osseous  abnormality identified. The entire anterior C7-T1 junction is not included. Soft tissues and spinal canal: No prevertebral fluid or swelling. No visible canal hematoma. Capacious spinal canal. Negative noncontrast neck soft tissues. Disc levels: Chronic facet ankylosis on the right at C7-T1. Stable degenerative changes since the 2017 MRI. Upper chest:  Only a portion of T1 is included, and appears grossly intact. IMPRESSION: 1. No acute intracranial abnormality. Stable non contrast CT appearance of the brain. 2. No acute facial fracture. Chronic right tympanic cavity and mastoid opacification. 3. No acute traumatic injury identified in the cervical spine. Electronically Signed   By: Genevie Ann M.D.   On: 07/28/2018 01:38    Procedures Procedures (including critical care time)  Medications Ordered in ED Medications  Tdap (BOOSTRIX) injection 0.5 mL (has no administration in time range)     Initial Impression / Assessment and Plan / ED Course  I have reviewed the triage vital signs and the nursing notes.  Pertinent labs & imaging results that were available during my care of the patient were reviewed by me and considered in my medical decision making (see chart for details).       Unwitnessed fall with multiple areas of ecchymosis and skin tears.  Confused but this appears to be her baseline. She has multiple areas of bruising to her left arm and right elbow as well as bruising to her face.  Will obtain CT head, face, C-spine as well as extremity x-rays. Labs obtained given her frequent falls.  Labs are reassuring.  Urinalysis is negative for infection. Depakote level therapeutic. Mild elevation of BUN. Small fluid bolus given.  Traumatic work-up is negative with reassuring CT scans and x-rays. No indication for admission to the hospital.  Discussed with patient's daughter by phone.  She agrees that patient should return to her facility.  Discussed that she may need to be evaluated by  physical and Occupational Therapy to determine whether she needs increased nursing care.  Patient able to ambulate with assistance.  She is supposed to be using a wheelchair per her daughter.  She appears stable to return to her facility.  Return precautions discussed Final Clinical Impressions(s) / ED Diagnoses   Final diagnoses:  Fall, initial encounter  Multiple contusions  Skin tear of left upper arm without complication, initial encounter    ED Discharge Orders    None       Zayli Villafuerte, Annie Main, MD 07/28/18 [phone]

## 2018-07-28 NOTE — ED Notes (Signed)
Pt ambulated with contact guard assist to restroom. Tolerated well

## 2018-07-28 NOTE — ED Notes (Signed)
Report given to Santiago Glad - Librarian, academic at [facility]

## 2018-07-28 NOTE — ED Triage Notes (Signed)
Pt arrives via EMS from [place] after she had an unwitnessed fall. Pt with several old bruises. Pt has a new skin tear to left upper arm and bruises to left arm and left hand. No complaints of pain at this time. Pt slightly confused per SNF this is not new.

## 2018-07-28 NOTE — Discharge Instructions (Signed)
X-rays are negative for fractures and CT scans of head, face and neck were negative.  Skin tear is should be kept clean and dry with bacitracin applied twice daily.  Patient may benefit from increased nursing care at her facility as well as physical therapy and Occupational Therapy consults if this is not recently been done. Follow-up with your doctor in 2 days for wound recheck.  Return to the ED with new or worsening symptoms.

## 2018-08-01 DIAGNOSIS — I700 Atherosclerosis of aorta: Secondary | ICD-10-CM | POA: Diagnosis not present

## 2018-08-01 DIAGNOSIS — E039 Hypothyroidism, unspecified: Secondary | ICD-10-CM | POA: Diagnosis not present

## 2018-08-01 DIAGNOSIS — W19XXXD Unspecified fall, subsequent encounter: Secondary | ICD-10-CM | POA: Diagnosis not present

## 2018-08-01 DIAGNOSIS — K219 Gastro-esophageal reflux disease without esophagitis: Secondary | ICD-10-CM | POA: Diagnosis not present

## 2018-08-01 DIAGNOSIS — N183 Chronic kidney disease, stage 3 (moderate): Secondary | ICD-10-CM | POA: Diagnosis not present

## 2018-08-01 DIAGNOSIS — I129 Hypertensive chronic kidney disease with stage 1 through stage 4 chronic kidney disease, or unspecified chronic kidney disease: Secondary | ICD-10-CM | POA: Diagnosis not present

## 2018-08-01 DIAGNOSIS — E785 Hyperlipidemia, unspecified: Secondary | ICD-10-CM | POA: Diagnosis not present

## 2018-08-01 DIAGNOSIS — Z87440 Personal history of urinary (tract) infections: Secondary | ICD-10-CM | POA: Diagnosis not present

## 2018-08-01 DIAGNOSIS — Z4800 Encounter for change or removal of nonsurgical wound dressing: Secondary | ICD-10-CM | POA: Diagnosis not present

## 2018-08-01 DIAGNOSIS — S41012D Laceration without foreign body of left shoulder, subsequent encounter: Secondary | ICD-10-CM | POA: Diagnosis not present

## 2018-08-01 DIAGNOSIS — S5001XD Contusion of right elbow, subsequent encounter: Secondary | ICD-10-CM | POA: Diagnosis not present

## 2018-08-01 DIAGNOSIS — S51011D Laceration without foreign body of right elbow, subsequent encounter: Secondary | ICD-10-CM | POA: Diagnosis not present

## 2018-08-01 DIAGNOSIS — D631 Anemia in chronic kidney disease: Secondary | ICD-10-CM | POA: Diagnosis not present

## 2018-08-01 DIAGNOSIS — Z9181 History of falling: Secondary | ICD-10-CM | POA: Diagnosis not present

## 2018-08-04 DIAGNOSIS — E785 Hyperlipidemia, unspecified: Secondary | ICD-10-CM | POA: Diagnosis not present

## 2018-08-04 DIAGNOSIS — S41012D Laceration without foreign body of left shoulder, subsequent encounter: Secondary | ICD-10-CM | POA: Diagnosis not present

## 2018-08-04 DIAGNOSIS — I129 Hypertensive chronic kidney disease with stage 1 through stage 4 chronic kidney disease, or unspecified chronic kidney disease: Secondary | ICD-10-CM | POA: Diagnosis not present

## 2018-08-04 DIAGNOSIS — K219 Gastro-esophageal reflux disease without esophagitis: Secondary | ICD-10-CM | POA: Diagnosis not present

## 2018-08-04 DIAGNOSIS — Z87440 Personal history of urinary (tract) infections: Secondary | ICD-10-CM | POA: Diagnosis not present

## 2018-08-04 DIAGNOSIS — I700 Atherosclerosis of aorta: Secondary | ICD-10-CM | POA: Diagnosis not present

## 2018-08-04 DIAGNOSIS — W19XXXD Unspecified fall, subsequent encounter: Secondary | ICD-10-CM | POA: Diagnosis not present

## 2018-08-04 DIAGNOSIS — E039 Hypothyroidism, unspecified: Secondary | ICD-10-CM | POA: Diagnosis not present

## 2018-08-04 DIAGNOSIS — Z4800 Encounter for change or removal of nonsurgical wound dressing: Secondary | ICD-10-CM | POA: Diagnosis not present

## 2018-08-04 DIAGNOSIS — S5001XD Contusion of right elbow, subsequent encounter: Secondary | ICD-10-CM | POA: Diagnosis not present

## 2018-08-04 DIAGNOSIS — S51011D Laceration without foreign body of right elbow, subsequent encounter: Secondary | ICD-10-CM | POA: Diagnosis not present

## 2018-08-04 DIAGNOSIS — Z9181 History of falling: Secondary | ICD-10-CM | POA: Diagnosis not present

## 2018-08-04 DIAGNOSIS — N183 Chronic kidney disease, stage 3 (moderate): Secondary | ICD-10-CM | POA: Diagnosis not present

## 2018-08-04 DIAGNOSIS — D631 Anemia in chronic kidney disease: Secondary | ICD-10-CM | POA: Diagnosis not present

## 2018-08-05 ENCOUNTER — Telehealth: Payer: Self-pay | Admitting: *Deleted

## 2018-08-05 NOTE — Telephone Encounter (Signed)
FYI for provider.     Gabriel Cirri from [facility] called to inform provider that patient had a fall but does not appear to have any injuries.  She will continue to observe for any symptoms or signs of pain or discomfort.

## 2018-08-07 DIAGNOSIS — W19XXXD Unspecified fall, subsequent encounter: Secondary | ICD-10-CM | POA: Diagnosis not present

## 2018-08-07 DIAGNOSIS — S21212A Laceration without foreign body of left back wall of thorax without penetration into thoracic cavity, initial encounter: Secondary | ICD-10-CM | POA: Diagnosis not present

## 2018-08-07 DIAGNOSIS — I129 Hypertensive chronic kidney disease with stage 1 through stage 4 chronic kidney disease, or unspecified chronic kidney disease: Secondary | ICD-10-CM | POA: Diagnosis not present

## 2018-08-07 DIAGNOSIS — Z9181 History of falling: Secondary | ICD-10-CM | POA: Diagnosis not present

## 2018-08-07 DIAGNOSIS — Z87440 Personal history of urinary (tract) infections: Secondary | ICD-10-CM | POA: Diagnosis not present

## 2018-08-07 DIAGNOSIS — S41012D Laceration without foreign body of left shoulder, subsequent encounter: Secondary | ICD-10-CM | POA: Diagnosis not present

## 2018-08-07 DIAGNOSIS — S51011D Laceration without foreign body of right elbow, subsequent encounter: Secondary | ICD-10-CM | POA: Diagnosis not present

## 2018-08-07 DIAGNOSIS — E039 Hypothyroidism, unspecified: Secondary | ICD-10-CM | POA: Diagnosis not present

## 2018-08-07 DIAGNOSIS — S51011A Laceration without foreign body of right elbow, initial encounter: Secondary | ICD-10-CM | POA: Diagnosis not present

## 2018-08-07 DIAGNOSIS — S5001XD Contusion of right elbow, subsequent encounter: Secondary | ICD-10-CM | POA: Diagnosis not present

## 2018-08-07 DIAGNOSIS — I700 Atherosclerosis of aorta: Secondary | ICD-10-CM | POA: Diagnosis not present

## 2018-08-07 DIAGNOSIS — Z4800 Encounter for change or removal of nonsurgical wound dressing: Secondary | ICD-10-CM | POA: Diagnosis not present

## 2018-08-07 DIAGNOSIS — E785 Hyperlipidemia, unspecified: Secondary | ICD-10-CM | POA: Diagnosis not present

## 2018-08-07 DIAGNOSIS — N183 Chronic kidney disease, stage 3 (moderate): Secondary | ICD-10-CM | POA: Diagnosis not present

## 2018-08-07 DIAGNOSIS — K219 Gastro-esophageal reflux disease without esophagitis: Secondary | ICD-10-CM | POA: Diagnosis not present

## 2018-08-07 DIAGNOSIS — D631 Anemia in chronic kidney disease: Secondary | ICD-10-CM | POA: Diagnosis not present

## 2018-08-08 ENCOUNTER — Other Ambulatory Visit: Payer: Self-pay

## 2018-08-08 ENCOUNTER — Ambulatory Visit (INDEPENDENT_AMBULATORY_CARE_PROVIDER_SITE_OTHER): Payer: Medicare Other

## 2018-08-08 DIAGNOSIS — S41012D Laceration without foreign body of left shoulder, subsequent encounter: Secondary | ICD-10-CM

## 2018-08-08 DIAGNOSIS — F0280 Dementia in other diseases classified elsewhere without behavioral disturbance: Secondary | ICD-10-CM

## 2018-08-08 DIAGNOSIS — S5001XD Contusion of right elbow, subsequent encounter: Secondary | ICD-10-CM | POA: Diagnosis not present

## 2018-08-08 DIAGNOSIS — W19XXXD Unspecified fall, subsequent encounter: Secondary | ICD-10-CM

## 2018-08-08 DIAGNOSIS — S51011D Laceration without foreign body of right elbow, subsequent encounter: Secondary | ICD-10-CM | POA: Diagnosis not present

## 2018-08-08 DIAGNOSIS — Z9181 History of falling: Secondary | ICD-10-CM

## 2018-08-08 DIAGNOSIS — I700 Atherosclerosis of aorta: Secondary | ICD-10-CM

## 2018-08-08 DIAGNOSIS — K219 Gastro-esophageal reflux disease without esophagitis: Secondary | ICD-10-CM

## 2018-08-08 DIAGNOSIS — Z87440 Personal history of urinary (tract) infections: Secondary | ICD-10-CM

## 2018-08-08 DIAGNOSIS — E039 Hypothyroidism, unspecified: Secondary | ICD-10-CM

## 2018-08-08 DIAGNOSIS — F329 Major depressive disorder, single episode, unspecified: Secondary | ICD-10-CM

## 2018-08-08 DIAGNOSIS — E785 Hyperlipidemia, unspecified: Secondary | ICD-10-CM

## 2018-08-08 DIAGNOSIS — D631 Anemia in chronic kidney disease: Secondary | ICD-10-CM

## 2018-08-08 DIAGNOSIS — I129 Hypertensive chronic kidney disease with stage 1 through stage 4 chronic kidney disease, or unspecified chronic kidney disease: Secondary | ICD-10-CM | POA: Diagnosis not present

## 2018-08-08 DIAGNOSIS — F419 Anxiety disorder, unspecified: Secondary | ICD-10-CM

## 2018-08-08 DIAGNOSIS — Z4800 Encounter for change or removal of nonsurgical wound dressing: Secondary | ICD-10-CM

## 2018-08-08 DIAGNOSIS — N183 Chronic kidney disease, stage 3 (moderate): Secondary | ICD-10-CM

## 2018-08-09 DIAGNOSIS — M6281 Muscle weakness (generalized): Secondary | ICD-10-CM | POA: Diagnosis not present

## 2018-08-09 DIAGNOSIS — R1032 Left lower quadrant pain: Secondary | ICD-10-CM | POA: Diagnosis not present

## 2018-08-09 DIAGNOSIS — G9341 Metabolic encephalopathy: Secondary | ICD-10-CM | POA: Diagnosis not present

## 2018-08-11 ENCOUNTER — Other Ambulatory Visit: Payer: Self-pay

## 2018-08-11 DIAGNOSIS — S5001XD Contusion of right elbow, subsequent encounter: Secondary | ICD-10-CM | POA: Diagnosis not present

## 2018-08-11 DIAGNOSIS — K219 Gastro-esophageal reflux disease without esophagitis: Secondary | ICD-10-CM | POA: Diagnosis not present

## 2018-08-11 DIAGNOSIS — I700 Atherosclerosis of aorta: Secondary | ICD-10-CM | POA: Diagnosis not present

## 2018-08-11 DIAGNOSIS — Z9181 History of falling: Secondary | ICD-10-CM | POA: Diagnosis not present

## 2018-08-11 DIAGNOSIS — W19XXXD Unspecified fall, subsequent encounter: Secondary | ICD-10-CM | POA: Diagnosis not present

## 2018-08-11 DIAGNOSIS — D631 Anemia in chronic kidney disease: Secondary | ICD-10-CM | POA: Diagnosis not present

## 2018-08-11 DIAGNOSIS — S51011D Laceration without foreign body of right elbow, subsequent encounter: Secondary | ICD-10-CM | POA: Diagnosis not present

## 2018-08-11 DIAGNOSIS — I129 Hypertensive chronic kidney disease with stage 1 through stage 4 chronic kidney disease, or unspecified chronic kidney disease: Secondary | ICD-10-CM | POA: Diagnosis not present

## 2018-08-11 DIAGNOSIS — E039 Hypothyroidism, unspecified: Secondary | ICD-10-CM | POA: Diagnosis not present

## 2018-08-11 DIAGNOSIS — N183 Chronic kidney disease, stage 3 (moderate): Secondary | ICD-10-CM | POA: Diagnosis not present

## 2018-08-11 DIAGNOSIS — E785 Hyperlipidemia, unspecified: Secondary | ICD-10-CM | POA: Diagnosis not present

## 2018-08-11 DIAGNOSIS — Z87440 Personal history of urinary (tract) infections: Secondary | ICD-10-CM | POA: Diagnosis not present

## 2018-08-11 DIAGNOSIS — Z4800 Encounter for change or removal of nonsurgical wound dressing: Secondary | ICD-10-CM | POA: Diagnosis not present

## 2018-08-11 DIAGNOSIS — S41012D Laceration without foreign body of left shoulder, subsequent encounter: Secondary | ICD-10-CM | POA: Diagnosis not present

## 2018-08-12 ENCOUNTER — Encounter: Payer: Self-pay | Admitting: Family Medicine

## 2018-08-12 ENCOUNTER — Ambulatory Visit (INDEPENDENT_AMBULATORY_CARE_PROVIDER_SITE_OTHER): Payer: Medicare Other | Admitting: Family Medicine

## 2018-08-12 VITALS — BP 132/62 | HR 83 | Temp 97.6°F | Ht 63.0 in | Wt 112.6 lb

## 2018-08-12 DIAGNOSIS — R4182 Altered mental status, unspecified: Secondary | ICD-10-CM | POA: Diagnosis not present

## 2018-08-12 DIAGNOSIS — R296 Repeated falls: Secondary | ICD-10-CM | POA: Diagnosis not present

## 2018-08-12 NOTE — Progress Notes (Signed)
BP 132/62   Pulse 83   Temp 97.6 F (36.4 C) (Oral)   Ht '5\' 3"'  (1.6 m)   Wt 112 lb 9.6 oz (51.1 kg)   BMI 19.95 kg/m    Subjective:   Patient ID: Deborah Rivers, female    DOB: 1925-01-07, 85 y.o.   MRN: [id number]  HPI: Deborah Rivers is a 85 y.o. female presenting on 08/12/2018 for Hospitalization Follow-up (6/4- altered mental status & 6/8 - fall )   HPI Altered mental status and falls Patient had a fall on 07/24/2018 and was in the ED for altered mental status on 07/28/2018.  She has been having recurring issues with this is why she got placed in [facility] facility.  She says she is struggling very mildly at [facility] socially because of the coronavirus they are all isolated in their own rooms and not able to go out and socialize.  Patient denies any suicidal ideations but says she is done with living at [facility] wants to go back and live on her own.  She is to greater a fall risk at this point to live on her own which is why she was placed at [facility] because of concerns for her health.  Patient denies any lightheadedness or dizziness today.  Relevant past medical, surgical, family and social history reviewed and updated as indicated. Interim medical history since our last visit reviewed. Allergies and medications reviewed and updated.  Review of Systems  Constitutional: Negative for chills and fever.  Eyes: Negative for visual disturbance.  Respiratory: Negative for chest tightness and shortness of breath.   Cardiovascular: Negative for chest pain and leg swelling.  Musculoskeletal: Negative for back pain and gait problem.  Skin: Negative for rash.  Neurological: Negative for dizziness, light-headedness and headaches.  Psychiatric/Behavioral: Positive for dysphoric mood. Negative for agitation, behavioral problems, self-injury, sleep disturbance and suicidal ideas. The patient is nervous/anxious.   All other systems reviewed and are negative.   Per HPI unless  specifically indicated above   Allergies as of 08/12/2018      Reactions   Sulfa Antibiotics Nausea Only, Anxiety, Rash      Medication List       Accurate as of August 12, 2018  2:35 PM. If you have any questions, ask your nurse or doctor.        STOP taking these medications   hydrocortisone cream 1 % Stopped by: Fransisca Kaufmann Linnet Bottari, MD     TAKE these medications   amLODipine 5 MG tablet Commonly known as: NORVASC Take 1 tablet (5 mg total) by mouth daily.   aspirin 81 MG tablet Take 1 tablet (81 mg total) by mouth daily.   atorvastatin 20 MG tablet Commonly known as: LIPITOR Take 1 tablet (20 mg total) by mouth daily at 6 PM.   CENTRUM SILVER PO Take 1 tablet by mouth daily.   diclofenac sodium 1 % Gel Commonly known as: VOLTAREN Apply 2 g topically 4 (four) times daily.   divalproex 125 MG DR tablet Commonly known as: DEPAKOTE Take 125 mg by mouth 3 (three) times daily.   ergocalciferol 1.25 MG (50000 UT) capsule Commonly known as: VITAMIN D2 Take 50,000 Units by mouth once a week. Mon   fluticasone 50 MCG/ACT nasal spray Commonly known as: FLONASE Place 2 sprays into both nostrils daily. What changed:   when to take this  reasons to take this   LORazepam 0.5 MG tablet Commonly known as: ATIVAN TAKE 1/2 (ONE-HALF)  TABLET BY MOUTH AT BEDTIME What changed:   how much to take  how to take this  when to take this   megestrol 20 MG tablet Commonly known as: MEGACE Take 20 mg by mouth daily.   potassium chloride SA 20 MEQ tablet Commonly known as: K-DUR Take 1 tablet (20 mEq total) by mouth 2 (two) times daily.   sertraline 50 MG tablet Commonly known as: Zoloft Take 1 tablet (50 mg total) by mouth daily.   Synthroid 50 MCG tablet Generic drug: levothyroxine Take 1 tablet (50 mcg total) by mouth daily.        Objective:   BP 132/62   Pulse 83   Temp 97.6 F (36.4 C) (Oral)   Ht '5\' 3"'  (1.6 m)   Wt 112 lb 9.6 oz (51.1 kg)   BMI  19.95 kg/m   Wt Readings from Last 3 Encounters:  08/12/18 112 lb 9.6 oz (51.1 kg)  07/28/18 110 lb 3.7 oz (50 kg)  07/24/18 111 lb 15.9 oz (50.8 kg)    Physical Exam Vitals signs and nursing note reviewed.  Constitutional:      General: She is not in acute distress.    Appearance: She is well-developed. She is not diaphoretic.  Eyes:     Conjunctiva/sclera: Conjunctivae normal.     Pupils: Pupils are equal, round, and reactive to light.  Cardiovascular:     Rate and Rhythm: Normal rate and regular rhythm.     Heart sounds: Normal heart sounds. No murmur.  Pulmonary:     Effort: Pulmonary effort is normal. No respiratory distress.     Breath sounds: Normal breath sounds. No wheezing.  Musculoskeletal: Normal range of motion.        General: No tenderness.  Skin:    General: Skin is warm and dry.     Findings: No rash.  Neurological:     Mental Status: She is alert and oriented to person, place, and time.     Coordination: Coordination normal.  Psychiatric:        Behavior: Behavior normal.        Cognition and Memory: Memory is impaired. She exhibits impaired recent memory.      Assessment & Plan:   Problem List Items Addressed This Visit    None    Visit Diagnoses    Altered mental status, unspecified altered mental status type    -  Primary   Relevant Orders   BMP8+EGFR   CBC with Differential/Platelet   Recurrent falls       Relevant Orders   BMP8+EGFR   CBC with Differential/Platelet      Patient is somewhat depressed because of the nursing home right now with the coronavirus she is just isolated in her room and she cannot even go out and talk to the other patients.  She cannot socialize with family because they cannot visit. Follow up plan: Return in about 3 months (around 11/12/2018), or if symptoms worsen or fail to improve, for Follow-up chronic issues.  Counseling provided for all of the vaccine components Orders Placed This Encounter  Procedures  .  BMP8+EGFR  . CBC with Differential/Platelet    Caryl Pina, MD [facility] 08/12/2018, 2:35 PM

## 2018-08-13 LAB — BMP8+EGFR
BUN/Creatinine Ratio: 22 (ref 12–28)
BUN: 28 mg/dL (ref 10–36)
CO2: 23 mmol/L (ref 20–29)
Calcium: 9.3 mg/dL (ref 8.7–10.3)
Chloride: 110 mmol/L — ABNORMAL HIGH (ref 96–106)
Creatinine, Ser: 1.25 mg/dL — ABNORMAL HIGH (ref 0.57–1.00)
GFR calc Af Amer: 43 mL/min/{1.73_m2} — ABNORMAL LOW (ref >59)
GFR calc non Af Amer: 37 mL/min/{1.73_m2} — ABNORMAL LOW (ref >59)
Glucose: 122 mg/dL — ABNORMAL HIGH (ref 65–99)
Potassium: 3.2 mmol/L — ABNORMAL LOW (ref 3.5–5.2)
Sodium: 146 mmol/L — ABNORMAL HIGH (ref 134–144)

## 2018-08-13 LAB — CBC WITH DIFFERENTIAL/PLATELET
Basophils Absolute: 0.1 10*3/uL (ref 0.0–0.2)
Basos: 1 %
EOS (ABSOLUTE): 0.4 10*3/uL (ref 0.0–0.4)
Eos: 5 %
Hematocrit: 29.4 % — ABNORMAL LOW (ref 34.0–46.6)
Hemoglobin: 9.6 g/dL — ABNORMAL LOW (ref 11.1–15.9)
Immature Grans (Abs): 0 10*3/uL (ref 0.0–0.1)
Immature Granulocytes: 0 %
Lymphocytes Absolute: 1.8 10*3/uL (ref 0.7–3.1)
Lymphs: 23 %
MCH: 28.2 pg (ref 26.6–33.0)
MCHC: 32.7 g/dL (ref 31.5–35.7)
MCV: 86 fL (ref 79–97)
Monocytes Absolute: 0.7 10*3/uL (ref 0.1–0.9)
Monocytes: 9 %
Neutrophils Absolute: 4.9 10*3/uL (ref 1.4–7.0)
Neutrophils: 62 %
Platelets: 280 10*3/uL (ref 150–450)
RBC: 3.41 x10E6/uL — ABNORMAL LOW (ref 3.77–5.28)
RDW: 14.4 % (ref 11.7–15.4)
WBC: 8 10*3/uL (ref 3.4–10.8)

## 2018-08-14 DIAGNOSIS — W19XXXD Unspecified fall, subsequent encounter: Secondary | ICD-10-CM | POA: Diagnosis not present

## 2018-08-14 DIAGNOSIS — D631 Anemia in chronic kidney disease: Secondary | ICD-10-CM | POA: Diagnosis not present

## 2018-08-14 DIAGNOSIS — Z4800 Encounter for change or removal of nonsurgical wound dressing: Secondary | ICD-10-CM | POA: Diagnosis not present

## 2018-08-14 DIAGNOSIS — E039 Hypothyroidism, unspecified: Secondary | ICD-10-CM | POA: Diagnosis not present

## 2018-08-14 DIAGNOSIS — E785 Hyperlipidemia, unspecified: Secondary | ICD-10-CM | POA: Diagnosis not present

## 2018-08-14 DIAGNOSIS — Z87440 Personal history of urinary (tract) infections: Secondary | ICD-10-CM | POA: Diagnosis not present

## 2018-08-14 DIAGNOSIS — N183 Chronic kidney disease, stage 3 (moderate): Secondary | ICD-10-CM | POA: Diagnosis not present

## 2018-08-14 DIAGNOSIS — S5001XD Contusion of right elbow, subsequent encounter: Secondary | ICD-10-CM | POA: Diagnosis not present

## 2018-08-14 DIAGNOSIS — Z9181 History of falling: Secondary | ICD-10-CM | POA: Diagnosis not present

## 2018-08-14 DIAGNOSIS — S41012D Laceration without foreign body of left shoulder, subsequent encounter: Secondary | ICD-10-CM | POA: Diagnosis not present

## 2018-08-14 DIAGNOSIS — S51011D Laceration without foreign body of right elbow, subsequent encounter: Secondary | ICD-10-CM | POA: Diagnosis not present

## 2018-08-14 DIAGNOSIS — I700 Atherosclerosis of aorta: Secondary | ICD-10-CM | POA: Diagnosis not present

## 2018-08-14 DIAGNOSIS — K219 Gastro-esophageal reflux disease without esophagitis: Secondary | ICD-10-CM | POA: Diagnosis not present

## 2018-08-14 DIAGNOSIS — I129 Hypertensive chronic kidney disease with stage 1 through stage 4 chronic kidney disease, or unspecified chronic kidney disease: Secondary | ICD-10-CM | POA: Diagnosis not present

## 2018-08-18 ENCOUNTER — Telehealth: Payer: Self-pay | Admitting: Family Medicine

## 2018-08-18 ENCOUNTER — Encounter: Payer: Self-pay | Admitting: *Deleted

## 2018-08-18 DIAGNOSIS — Z87440 Personal history of urinary (tract) infections: Secondary | ICD-10-CM | POA: Diagnosis not present

## 2018-08-18 DIAGNOSIS — E039 Hypothyroidism, unspecified: Secondary | ICD-10-CM | POA: Diagnosis not present

## 2018-08-18 DIAGNOSIS — N183 Chronic kidney disease, stage 3 (moderate): Secondary | ICD-10-CM | POA: Diagnosis not present

## 2018-08-18 DIAGNOSIS — Z4800 Encounter for change or removal of nonsurgical wound dressing: Secondary | ICD-10-CM | POA: Diagnosis not present

## 2018-08-18 DIAGNOSIS — E785 Hyperlipidemia, unspecified: Secondary | ICD-10-CM | POA: Diagnosis not present

## 2018-08-18 DIAGNOSIS — K219 Gastro-esophageal reflux disease without esophagitis: Secondary | ICD-10-CM | POA: Diagnosis not present

## 2018-08-18 DIAGNOSIS — S5001XD Contusion of right elbow, subsequent encounter: Secondary | ICD-10-CM | POA: Diagnosis not present

## 2018-08-18 DIAGNOSIS — D631 Anemia in chronic kidney disease: Secondary | ICD-10-CM | POA: Diagnosis not present

## 2018-08-18 DIAGNOSIS — W19XXXD Unspecified fall, subsequent encounter: Secondary | ICD-10-CM | POA: Diagnosis not present

## 2018-08-18 DIAGNOSIS — I129 Hypertensive chronic kidney disease with stage 1 through stage 4 chronic kidney disease, or unspecified chronic kidney disease: Secondary | ICD-10-CM | POA: Diagnosis not present

## 2018-08-18 DIAGNOSIS — S41012D Laceration without foreign body of left shoulder, subsequent encounter: Secondary | ICD-10-CM | POA: Diagnosis not present

## 2018-08-18 DIAGNOSIS — Z9181 History of falling: Secondary | ICD-10-CM | POA: Diagnosis not present

## 2018-08-18 DIAGNOSIS — I700 Atherosclerosis of aorta: Secondary | ICD-10-CM | POA: Diagnosis not present

## 2018-08-18 DIAGNOSIS — S51011D Laceration without foreign body of right elbow, subsequent encounter: Secondary | ICD-10-CM | POA: Diagnosis not present

## 2018-08-18 MED ORDER — FERROUS GLUCONATE 324 (38 FE) MG PO TABS: 324.0000 mg | ORAL_TABLET | Freq: Every day | ORAL | 3 refills | Status: DC

## 2018-08-18 NOTE — Telephone Encounter (Signed)
Patient's kidney function is essentially same, her potassium is low so make sure she continues to take the potassium chloride 20 mEq daily that she was put on just recently.  Her blood counts are also slightly down and that she is slightly anemic so I have sent a prescription for iron for her.  Please have her start taking this.  Watch for any signs of bleeding in stool. Caryl Pina, MD [facility] 08/18/2018, 11:20 AM

## 2018-08-18 NOTE — Telephone Encounter (Signed)
[facility] was faxed  message of new script for iron.

## 2018-08-21 DIAGNOSIS — Z4800 Encounter for change or removal of nonsurgical wound dressing: Secondary | ICD-10-CM | POA: Diagnosis not present

## 2018-08-21 DIAGNOSIS — S51011D Laceration without foreign body of right elbow, subsequent encounter: Secondary | ICD-10-CM | POA: Diagnosis not present

## 2018-08-21 DIAGNOSIS — S5001XD Contusion of right elbow, subsequent encounter: Secondary | ICD-10-CM | POA: Diagnosis not present

## 2018-08-21 DIAGNOSIS — S41012D Laceration without foreign body of left shoulder, subsequent encounter: Secondary | ICD-10-CM | POA: Diagnosis not present

## 2018-08-21 DIAGNOSIS — Z9181 History of falling: Secondary | ICD-10-CM | POA: Diagnosis not present

## 2018-08-21 DIAGNOSIS — E039 Hypothyroidism, unspecified: Secondary | ICD-10-CM | POA: Diagnosis not present

## 2018-08-21 DIAGNOSIS — I129 Hypertensive chronic kidney disease with stage 1 through stage 4 chronic kidney disease, or unspecified chronic kidney disease: Secondary | ICD-10-CM | POA: Diagnosis not present

## 2018-08-21 DIAGNOSIS — N183 Chronic kidney disease, stage 3 (moderate): Secondary | ICD-10-CM | POA: Diagnosis not present

## 2018-08-21 DIAGNOSIS — K219 Gastro-esophageal reflux disease without esophagitis: Secondary | ICD-10-CM | POA: Diagnosis not present

## 2018-08-21 DIAGNOSIS — I700 Atherosclerosis of aorta: Secondary | ICD-10-CM | POA: Diagnosis not present

## 2018-08-21 DIAGNOSIS — W19XXXD Unspecified fall, subsequent encounter: Secondary | ICD-10-CM | POA: Diagnosis not present

## 2018-08-21 DIAGNOSIS — E785 Hyperlipidemia, unspecified: Secondary | ICD-10-CM | POA: Diagnosis not present

## 2018-08-21 DIAGNOSIS — D631 Anemia in chronic kidney disease: Secondary | ICD-10-CM | POA: Diagnosis not present

## 2018-08-21 DIAGNOSIS — Z87440 Personal history of urinary (tract) infections: Secondary | ICD-10-CM | POA: Diagnosis not present

## 2018-08-28 DIAGNOSIS — Z9181 History of falling: Secondary | ICD-10-CM | POA: Diagnosis not present

## 2018-08-28 DIAGNOSIS — S41012D Laceration without foreign body of left shoulder, subsequent encounter: Secondary | ICD-10-CM | POA: Diagnosis not present

## 2018-08-28 DIAGNOSIS — Z4800 Encounter for change or removal of nonsurgical wound dressing: Secondary | ICD-10-CM | POA: Diagnosis not present

## 2018-08-28 DIAGNOSIS — I700 Atherosclerosis of aorta: Secondary | ICD-10-CM | POA: Diagnosis not present

## 2018-08-28 DIAGNOSIS — W19XXXD Unspecified fall, subsequent encounter: Secondary | ICD-10-CM | POA: Diagnosis not present

## 2018-08-28 DIAGNOSIS — D631 Anemia in chronic kidney disease: Secondary | ICD-10-CM | POA: Diagnosis not present

## 2018-08-28 DIAGNOSIS — E039 Hypothyroidism, unspecified: Secondary | ICD-10-CM | POA: Diagnosis not present

## 2018-08-28 DIAGNOSIS — N183 Chronic kidney disease, stage 3 (moderate): Secondary | ICD-10-CM | POA: Diagnosis not present

## 2018-08-28 DIAGNOSIS — Z87440 Personal history of urinary (tract) infections: Secondary | ICD-10-CM | POA: Diagnosis not present

## 2018-08-28 DIAGNOSIS — K219 Gastro-esophageal reflux disease without esophagitis: Secondary | ICD-10-CM | POA: Diagnosis not present

## 2018-08-28 DIAGNOSIS — S5001XD Contusion of right elbow, subsequent encounter: Secondary | ICD-10-CM | POA: Diagnosis not present

## 2018-08-28 DIAGNOSIS — I129 Hypertensive chronic kidney disease with stage 1 through stage 4 chronic kidney disease, or unspecified chronic kidney disease: Secondary | ICD-10-CM | POA: Diagnosis not present

## 2018-08-28 DIAGNOSIS — E785 Hyperlipidemia, unspecified: Secondary | ICD-10-CM | POA: Diagnosis not present

## 2018-08-28 DIAGNOSIS — S51011D Laceration without foreign body of right elbow, subsequent encounter: Secondary | ICD-10-CM | POA: Diagnosis not present

## 2018-08-31 DIAGNOSIS — I10 Essential (primary) hypertension: Secondary | ICD-10-CM | POA: Diagnosis not present

## 2018-08-31 DIAGNOSIS — S0990XA Unspecified injury of head, initial encounter: Secondary | ICD-10-CM | POA: Diagnosis not present

## 2018-08-31 DIAGNOSIS — E7800 Pure hypercholesterolemia, unspecified: Secondary | ICD-10-CM | POA: Diagnosis not present

## 2018-08-31 DIAGNOSIS — R4020 Unspecified coma: Secondary | ICD-10-CM | POA: Diagnosis not present

## 2018-08-31 DIAGNOSIS — S7002XA Contusion of left hip, initial encounter: Secondary | ICD-10-CM | POA: Diagnosis not present

## 2018-08-31 DIAGNOSIS — Z79899 Other long term (current) drug therapy: Secondary | ICD-10-CM | POA: Diagnosis not present

## 2018-08-31 DIAGNOSIS — S7012XA Contusion of left thigh, initial encounter: Secondary | ICD-10-CM | POA: Diagnosis not present

## 2018-08-31 DIAGNOSIS — W19XXXA Unspecified fall, initial encounter: Secondary | ICD-10-CM | POA: Diagnosis not present

## 2018-08-31 DIAGNOSIS — Z7982 Long term (current) use of aspirin: Secondary | ICD-10-CM | POA: Diagnosis not present

## 2018-08-31 DIAGNOSIS — S79911A Unspecified injury of right hip, initial encounter: Secondary | ICD-10-CM | POA: Diagnosis not present

## 2018-08-31 DIAGNOSIS — R0689 Other abnormalities of breathing: Secondary | ICD-10-CM | POA: Diagnosis not present

## 2018-08-31 DIAGNOSIS — I959 Hypotension, unspecified: Secondary | ICD-10-CM | POA: Diagnosis not present

## 2018-09-01 ENCOUNTER — Telehealth: Payer: Self-pay | Admitting: Family Medicine

## 2018-09-02 NOTE — Telephone Encounter (Signed)
appt scheduled  [facility] notified

## 2018-09-04 ENCOUNTER — Telehealth: Payer: Self-pay | Admitting: Family Medicine

## 2018-09-04 DIAGNOSIS — D631 Anemia in chronic kidney disease: Secondary | ICD-10-CM | POA: Diagnosis not present

## 2018-09-04 DIAGNOSIS — S51011D Laceration without foreign body of right elbow, subsequent encounter: Secondary | ICD-10-CM | POA: Diagnosis not present

## 2018-09-04 DIAGNOSIS — I129 Hypertensive chronic kidney disease with stage 1 through stage 4 chronic kidney disease, or unspecified chronic kidney disease: Secondary | ICD-10-CM | POA: Diagnosis not present

## 2018-09-04 DIAGNOSIS — Z4800 Encounter for change or removal of nonsurgical wound dressing: Secondary | ICD-10-CM | POA: Diagnosis not present

## 2018-09-04 DIAGNOSIS — K219 Gastro-esophageal reflux disease without esophagitis: Secondary | ICD-10-CM | POA: Diagnosis not present

## 2018-09-04 DIAGNOSIS — N183 Chronic kidney disease, stage 3 (moderate): Secondary | ICD-10-CM | POA: Diagnosis not present

## 2018-09-04 DIAGNOSIS — Z87440 Personal history of urinary (tract) infections: Secondary | ICD-10-CM | POA: Diagnosis not present

## 2018-09-04 DIAGNOSIS — Z9181 History of falling: Secondary | ICD-10-CM | POA: Diagnosis not present

## 2018-09-04 DIAGNOSIS — S5001XD Contusion of right elbow, subsequent encounter: Secondary | ICD-10-CM | POA: Diagnosis not present

## 2018-09-04 DIAGNOSIS — E785 Hyperlipidemia, unspecified: Secondary | ICD-10-CM | POA: Diagnosis not present

## 2018-09-04 DIAGNOSIS — I700 Atherosclerosis of aorta: Secondary | ICD-10-CM | POA: Diagnosis not present

## 2018-09-04 DIAGNOSIS — S41012D Laceration without foreign body of left shoulder, subsequent encounter: Secondary | ICD-10-CM | POA: Diagnosis not present

## 2018-09-04 DIAGNOSIS — E039 Hypothyroidism, unspecified: Secondary | ICD-10-CM | POA: Diagnosis not present

## 2018-09-04 DIAGNOSIS — W19XXXD Unspecified fall, subsequent encounter: Secondary | ICD-10-CM | POA: Diagnosis not present

## 2018-09-04 NOTE — Telephone Encounter (Signed)
Spoke with Gabriel Cirri at [facility] states pt had a fall Pt is not complaining of any pain and has no obvious injury per [place] She states pt is taking Sertraline but dosage was decreased by psych to 25mg  daily Gabriel Cirri will contact psych regarding pt's depression

## 2018-09-04 NOTE — Telephone Encounter (Signed)
Okay sounds good

## 2018-09-04 NOTE — Telephone Encounter (Signed)
Is the patient still taking her sertraline/Zoloft every day, if she is we may need to increase that.

## 2018-09-08 DIAGNOSIS — R1032 Left lower quadrant pain: Secondary | ICD-10-CM | POA: Diagnosis not present

## 2018-09-08 DIAGNOSIS — G9341 Metabolic encephalopathy: Secondary | ICD-10-CM | POA: Diagnosis not present

## 2018-09-08 DIAGNOSIS — M6281 Muscle weakness (generalized): Secondary | ICD-10-CM | POA: Diagnosis not present

## 2018-09-11 ENCOUNTER — Telehealth: Payer: Self-pay | Admitting: Family Medicine

## 2018-09-11 ENCOUNTER — Encounter: Payer: Self-pay | Admitting: Family Medicine

## 2018-09-11 ENCOUNTER — Other Ambulatory Visit: Payer: Self-pay

## 2018-09-11 ENCOUNTER — Ambulatory Visit (INDEPENDENT_AMBULATORY_CARE_PROVIDER_SITE_OTHER): Payer: Medicare Other | Admitting: Family Medicine

## 2018-09-11 VITALS — BP 146/73 | HR 82 | Temp 98.3°F | Ht 63.0 in | Wt 106.6 lb

## 2018-09-11 DIAGNOSIS — F0281 Dementia in other diseases classified elsewhere with behavioral disturbance: Secondary | ICD-10-CM

## 2018-09-11 DIAGNOSIS — F331 Major depressive disorder, recurrent, moderate: Secondary | ICD-10-CM

## 2018-09-11 DIAGNOSIS — F02818 Dementia in other diseases classified elsewhere, unspecified severity, with other behavioral disturbance: Secondary | ICD-10-CM

## 2018-09-11 DIAGNOSIS — G301 Alzheimer's disease with late onset: Secondary | ICD-10-CM | POA: Diagnosis not present

## 2018-09-11 DIAGNOSIS — I10 Essential (primary) hypertension: Secondary | ICD-10-CM

## 2018-09-11 MED ORDER — SERTRALINE HCL 50 MG PO TABS: 50.0000 mg | ORAL_TABLET | Freq: Every day | ORAL | 3 refills | Status: AC

## 2018-09-11 NOTE — Progress Notes (Signed)
BP (!) 146/73   Pulse 82   Temp 98.3 F (36.8 C) (Oral)   Ht 5\' 3"  (1.6 m)   Wt 106 lb 9.6 oz (48.4 kg)   BMI 18.88 kg/m    Subjective:   Patient ID: Deborah Rivers, female    DOB: 12/28/1924, 85 y.o.   MRN: [id number]  HPI: Deborah Rivers is a 85 y.o. female presenting on 09/11/2018 for Hospitalization Follow-up (6/4 AP alterned mental status, 6/8 AP fall & 6/16 [facility] fall)   HPI Patient is coming in today for [employer] him and [facility] x2 hospital follow-ups.  She was in on 2 different occasions for altered mental status and falls on 2 occasions.  They had treated her for a urinary tract infection on one occasion. Patient is also coming in to discuss depression and anxiety.  Her family was concerned about her having the Xanax and having some possible delirium related to it or may be hallucinations related to it, the hallucinations that have been visualized are especially related to missing her family and being in the facility under lockdown.  Patient is also been having very severe depression and she feels like she be rather off dead and would rather be home than where she is at.  She has been unable to be home because of the recurrent falls and weakness.  She has been unable to do physical therapy because they are locked down because of the coronavirus.  Her dementia has been seemingly worsening progressively over the past year.  Patient denies any suicidal ideations.  Relevant past medical, surgical, family and social history reviewed and updated as indicated. Interim medical history since our last visit reviewed. Allergies and medications reviewed and updated.  Review of Systems  Constitutional: Negative for chills and fever.  Eyes: Negative for visual disturbance.  Respiratory: Negative for chest tightness and shortness of breath.   Cardiovascular: Negative for chest pain and leg swelling.  Genitourinary: Negative for difficulty urinating and dysuria.  Musculoskeletal:  Negative for back pain and gait problem.  Skin: Negative for rash.  Neurological: Positive for weakness. Negative for light-headedness and headaches.  Psychiatric/Behavioral: Positive for decreased concentration, dysphoric mood, hallucinations and sleep disturbance. Negative for agitation, behavioral problems, self-injury and suicidal ideas. The patient is nervous/anxious.   All other systems reviewed and are negative.   Per HPI unless specifically indicated above   Allergies as of 09/11/2018      Reactions   Sulfa Antibiotics Nausea Only, Anxiety, Rash      Medication List       Accurate as of September 11, 2018  5:01 PM. If you have any questions, ask your nurse or doctor.        STOP taking these medications   amLODipine 5 MG tablet Commonly known as: NORVASC Stopped by: Fransisca Kaufmann Nike Southwell, MD   LORazepam 0.5 MG tablet Commonly known as: ATIVAN Stopped by: Fransisca Kaufmann Quintez Maselli, MD     TAKE these medications   aspirin 81 MG tablet Take 1 tablet (81 mg total) by mouth daily.   atorvastatin 20 MG tablet Commonly known as: LIPITOR Take 1 tablet (20 mg total) by mouth daily at 6 PM.   CENTRUM SILVER PO Take 1 tablet by mouth daily.   diclofenac sodium 1 % Gel Commonly known as: VOLTAREN Apply 2 g topically 4 (four) times daily.   divalproex 125 MG DR tablet Commonly known as: DEPAKOTE Take 125 mg by mouth 3 (three) times daily.  ergocalciferol 1.25 MG (50000 UT) capsule Commonly known as: VITAMIN D2 Take 50,000 Units by mouth once a week. Mon   ferrous gluconate 324 MG tablet Commonly known as: FERGON Take 1 tablet (324 mg total) by mouth daily with breakfast.   fluticasone 50 MCG/ACT nasal spray Commonly known as: FLONASE Place 2 sprays into both nostrils daily. What changed:   when to take this  reasons to take this   megestrol 20 MG tablet Commonly known as: MEGACE Take 20 mg by mouth daily.   potassium chloride SA 20 MEQ tablet Commonly known as:  K-DUR Take 1 tablet (20 mEq total) by mouth 2 (two) times daily.   sertraline 50 MG tablet Commonly known as: ZOLOFT Take 1 tablet (50 mg total) by mouth daily. What changed:   medication strength  how much to take  Another medication with the same name was removed. Continue taking this medication, and follow the directions you see here. Changed by: Worthy Rancher, MD   Synthroid 50 MCG tablet Generic drug: levothyroxine Take 1 tablet (50 mcg total) by mouth daily.        Objective:   BP (!) 146/73   Pulse 82   Temp 98.3 F (36.8 C) (Oral)   Ht 5\' 3"  (1.6 m)   Wt 106 lb 9.6 oz (48.4 kg)   BMI 18.88 kg/m   Wt Readings from Last 3 Encounters:  09/11/18 106 lb 9.6 oz (48.4 kg)  08/12/18 112 lb 9.6 oz (51.1 kg)  07/28/18 110 lb 3.7 oz (50 kg)    Physical Exam Vitals signs and nursing note reviewed.  Constitutional:      General: She is not in acute distress.    Appearance: She is well-developed. She is not diaphoretic.  Eyes:     Conjunctiva/sclera: Conjunctivae normal.  Cardiovascular:     Rate and Rhythm: Normal rate and regular rhythm.     Heart sounds: Normal heart sounds. No murmur.  Pulmonary:     Effort: Pulmonary effort is normal. No respiratory distress.     Breath sounds: Normal breath sounds. No wheezing.  Musculoskeletal: Normal range of motion.        General: No tenderness.  Skin:    General: Skin is warm and dry.     Findings: No rash.  Neurological:     Mental Status: She is alert and oriented to person, place, and time.     Coordination: Coordination normal.  Psychiatric:        Mood and Affect: Mood is anxious and depressed.        Behavior: Behavior normal.        Thought Content: Thought content does not include suicidal ideation. Thought content does not include suicidal plan.    Patient is mostly wheelchair-bound at this point.   Assessment & Plan:   Problem List Items Addressed This Visit      Cardiovascular and  Mediastinum   Essential hypertension     Nervous and Auditory   Dementia (HCC) - Primary   Relevant Medications   sertraline (ZOLOFT) 50 MG tablet     Other   Depression   Relevant Medications   sertraline (ZOLOFT) 50 MG tablet    Patient has already not been taking Xanax because the family does not want her to have it and she has been having recurrent falls and hallucinations, we will stop the Xanax  We will increase her Zoloft because she is more depressed  She has been having recurrent  falls and some hypotension at the facility we will discontinue her amlodipine and then will monitor her blood pressures and call us if she has any blood pressures over 155 or over 95.  They will transfer the patient to the memory unit because she is having a lot of wandering and wanting to leave  Follow up plan: Return in about 3 weeks (around 10/02/2018), or if symptoms worsen or fail to improve, for Depression recheck.  Counseling provided for all of the vaccine components No orders of the defined types were placed in this encounter.   Caryl Pina, MD [facility] 09/11/2018, 5:01 PM

## 2018-09-12 NOTE — Telephone Encounter (Signed)
Appointment scheduled.

## 2018-09-13 ENCOUNTER — Other Ambulatory Visit: Payer: Self-pay | Admitting: Family Medicine

## 2018-09-13 NOTE — Progress Notes (Signed)
Patient is working with in house psych and will give 0.5 mg lorazepam and if does not go well she may need to be transferred to [facility] for psych hold and reevaluate from there.  She is walking around and they are concerned about her falling and she is trying to pull a fire alarms trying to get out and being somewhat combative towards staff as well. Caryl Pina, MD [facility] 09/13/2018, 11:38 AM

## 2018-09-18 ENCOUNTER — Telehealth: Payer: Self-pay | Admitting: Family Medicine

## 2018-09-18 NOTE — Telephone Encounter (Signed)
She can just come in for an x-ray but if that is the case I am gone for almost 2 weeks and you will have to put the x-ray under somebody else or have her see somebody else if they do not feel comfortable distorting the x-ray.

## 2018-09-18 NOTE — Telephone Encounter (Signed)
Please advise and order x ray if needed.

## 2018-09-18 NOTE — Telephone Encounter (Signed)
Yes she should probably get a left hip x-ray with pelvic, diagnosis fall.  Please have them order it, I believe they can do this at the nursing home, if not I guess she can come in here and get it done

## 2018-09-18 NOTE — Telephone Encounter (Signed)
Unable to do Xrays at assisted living at this time.  Do you want patient to come in and be seen or just come in for Xray?

## 2018-09-19 DIAGNOSIS — M2042 Other hammer toe(s) (acquired), left foot: Secondary | ICD-10-CM | POA: Diagnosis not present

## 2018-09-19 DIAGNOSIS — B351 Tinea unguium: Secondary | ICD-10-CM | POA: Diagnosis not present

## 2018-09-19 DIAGNOSIS — N183 Chronic kidney disease, stage 3 (moderate): Secondary | ICD-10-CM | POA: Diagnosis not present

## 2018-09-19 NOTE — Telephone Encounter (Signed)
appt scheduled

## 2018-09-23 ENCOUNTER — Encounter: Payer: Self-pay | Admitting: Family

## 2018-09-23 ENCOUNTER — Ambulatory Visit (INDEPENDENT_AMBULATORY_CARE_PROVIDER_SITE_OTHER): Payer: Medicare Other | Admitting: Family

## 2018-09-23 ENCOUNTER — Ambulatory Visit (INDEPENDENT_AMBULATORY_CARE_PROVIDER_SITE_OTHER): Payer: Medicare Other

## 2018-09-23 ENCOUNTER — Other Ambulatory Visit: Payer: Self-pay

## 2018-09-23 VITALS — BP 137/79 | HR 78 | Temp 98.9°F

## 2018-09-23 DIAGNOSIS — R5383 Other fatigue: Secondary | ICD-10-CM | POA: Diagnosis not present

## 2018-09-23 DIAGNOSIS — Z9181 History of falling: Secondary | ICD-10-CM

## 2018-09-23 DIAGNOSIS — T148XXA Other injury of unspecified body region, initial encounter: Secondary | ICD-10-CM

## 2018-09-23 DIAGNOSIS — F0281 Dementia in other diseases classified elsewhere with behavioral disturbance: Secondary | ICD-10-CM

## 2018-09-23 DIAGNOSIS — M25552 Pain in left hip: Secondary | ICD-10-CM

## 2018-09-23 DIAGNOSIS — G301 Alzheimer's disease with late onset: Secondary | ICD-10-CM

## 2018-09-23 DIAGNOSIS — F02818 Dementia in other diseases classified elsewhere, unspecified severity, with other behavioral disturbance: Secondary | ICD-10-CM

## 2018-09-23 DIAGNOSIS — S79912A Unspecified injury of left hip, initial encounter: Secondary | ICD-10-CM | POA: Diagnosis not present

## 2018-09-23 NOTE — Patient Instructions (Signed)
Hematoma A hematoma is a collection of blood under the skin, in an organ, in a body space, in a joint space, or in other tissue. The blood can thicken (clot) to form a lump that you can see and feel. The lump is often firm and may become sore and tender. Most hematomas get better in a few days to weeks. However, some hematomas may be serious and require medical care. Hematomas can range from very small to very large. What are the causes? This condition is caused by:  A blunt or penetrating injury.  A leakage from a blood vessel under the skin.  Some medical procedures, including surgeries, such as oral surgery, face lifts, and surgeries on the joints.  Some medical conditions that cause bleeding or bruising. There may be multiple hematomas that appear in different areas of the body. What increases the risk? You are more likely to develop this condition if:  You are an older adult.  You use blood thinners. What are the signs or symptoms?  Symptoms of this condition depend on where the hematoma is located.  Common symptoms of a hematoma that is under the skin include:  A firm lump on the body.  Pain and tenderness in the area.  Bruising. Blue, dark blue, purple-red, or yellowish skin (discoloration) may appear at the site of the hematoma if the hematoma is close to the surface of the skin. Common symptoms of a hematoma that is deep in the tissues or body spaces may be less obvious. They include:  A collection of blood in the stomach (intra-abdominal hematoma). This may cause pain in the abdomen, weakness, fainting, and shortness of breath.  A collection of blood in the head (intracranial hematoma). This may cause a headache or symptoms such as weakness, trouble speaking or understanding, or a change in consciousness. How is this diagnosed? This condition is diagnosed based on:  Your medical history.  A physical exam.  Imaging tests, such as an ultrasound or CT scan. These may  be needed if your health care provider suspects a hematoma in deeper tissues or body spaces.  Blood tests. These may be needed if your health care provider believes that the hematoma is caused by a medical condition. How is this treated? Treatment for this condition depends on the cause, size, and location of the hematoma. Treatment may include:  Doing nothing. The majority of hematomas do not need treatment as many of them go away on their own over time.  Surgery or close monitoring. This may be needed for large hematomas or hematomas that affect vital organs.  Medicines. Medicines may be given if there is an underlying medical cause for the hematoma. Follow these instructions at home: Managing pain, stiffness, and swelling   If directed, put ice on the affected area. ? Put ice in a plastic bag. ? Place a towel between your skin and the bag. ? Leave the ice on for 20 minutes, 2-3 times a day for the first couple of days.  If directed, apply heat to the affected area after applying ice for a couple of days. Use the heat source that your health care provider recommends, such as a moist heat pack or a heating pad. ? Place a towel between your skin and the heat source. ? Leave the heat on for 20-30 minutes. ? Remove the heat if your skin turns bright red. This is especially important if you are unable to feel pain, heat, or cold. You may have a greater   risk of getting burned.  Raise (elevate) the affected area above the level of your heart while you are sitting or lying down.  If told, wrap the affected area with an elastic bandage. The bandage applies pressure (compression) to the area, which may help to reduce swelling and promote healing. Do not wrap the bandage too tightly around the affected area.  If your hematoma is on a leg or foot (lower extremity) and is painful, your health care provider may recommend crutches. Use them as told by your health care provider. General instructions   Take over-the-counter and prescription medicines only as told by your health care provider.  Keep all follow-up visits as told by your health care provider. This is important. Contact a health care provider if:  You have a fever.  The swelling or discoloration gets worse.  You develop more hematomas. Get help right away if:  Your pain is worse or your pain is not controlled with medicine.  Your skin over the hematoma breaks or starts bleeding.  Your hematoma is in your chest or abdomen and you have weakness, shortness of breath, or a change in consciousness.  You have a hematoma on your scalp that is caused by a fall or injury, and you also have: ? A headache that gets worse. ? Trouble speaking or understanding speech. ? Weakness. ? Change in alertness or consciousness. Summary  A hematoma is a collection of blood under the skin, in an organ, in a body space, in a joint space, or in other tissue.  This condition usually does not need treatment because many hematomas go away on their own over time.  Large hematomas, or those that may affect vital organs, may need surgical drainage or monitoring. If the hematoma is caused by a medical condition, medicines may be prescribed.  Get help right away if your hematoma breaks or starts to bleed, you have shortness of breath, or you have a headache or trouble speaking after a fall. This information is not intended to replace advice given to you by your health care provider. Make sure you discuss any questions you have with your health care provider. Document Released: 09/20/2003 Document Revised: 07/11/2017 Document Reviewed: 07/11/2017 Elsevier Patient Education  2020 [employer]  

## 2018-09-23 NOTE — Progress Notes (Signed)
Subjective:    Patient ID: Deborah Rivers, female    DOB: 03/18/1924, 85 y.o.   MRN: [id number]  Chief Complaint  Patient presents with  . left hip pain from a fall    HPI Pt presents to the office today with with mild left hip pain and swelling. She is a resident at [facility] and her caregiver is present. She states she fell 08/31/18 about three time and landed on her left hip. She she states her pain is mild and improving, but has a area of swelling. She went to the ED on 08/31/18 and had a CT that was negative for fracture, but showed a large hematoma.   Pt has dementia and is a poor historian. She is very worried about this nodule that has not resolved.    Review of Systems  Constitutional: Positive for fatigue.  Musculoskeletal: Positive for arthralgias.  Skin:       ecchymosis   All other systems reviewed and are negative.      Objective:   Physical Exam Vitals signs reviewed.  Constitutional:      General: She is not in acute distress.    Appearance: She is well-developed.  HENT:     Head: Normocephalic and atraumatic.  Eyes:     Pupils: Pupils are equal, round, and reactive to light.  Neck:     Musculoskeletal: Normal range of motion and neck supple.     Thyroid: No thyromegaly.  Cardiovascular:     Rate and Rhythm: Normal rate and regular rhythm.     Heart sounds: Normal heart sounds. No murmur.  Pulmonary:     Effort: Pulmonary effort is normal. No respiratory distress.     Breath sounds: Normal breath sounds. No wheezing.  Abdominal:     General: Bowel sounds are normal. There is no distension.     Palpations: Abdomen is soft.     Tenderness: There is no abdominal tenderness.  Musculoskeletal:        General: No tenderness.     Comments: Pt in wheelchair and weakness noted. Has full ROM of left hip. Large ecchymosis present that extends down to her lower calf. Fluid fill nodule noted on left lateral leg, approx 10.5 X5.6 cm  Skin:  General: Skin is warm and dry.  Neurological:     Mental Status: She is alert and oriented to person, place, and time.     Cranial Nerves: No cranial nerve deficit.     Motor: Weakness present.     Deep Tendon Reflexes: Reflexes are normal and symmetric.  Psychiatric:        Behavior: Behavior normal.        Thought Content: Thought content normal.        Judgment: Judgment normal.    X-ray- Negative for fracture. Preliminary reading by Evelina Dun, FNP [facility]  BP 137/79   Pulse 78   Temp 98.9 F (37.2 C) (Temporal)       Assessment & Plan:  Deborah Rivers comes in today with chief complaint of left hip pain from a fall   Diagnosis and orders addressed:  1. Left hip pain - DG HIP UNILAT W OR W/O PELVIS 2-3 VIEWS LEFT; Future  2. Hematoma  3. Fatigue, unspecified type   4. Late onset Alzheimer's disease with behavioral disturbance ([place])  5. At high risk for injury related to fall  Rest Keep elevated Fall preventions discussed Keep follow up with PCP    Alyse Low  Lenna Gilford, [place]

## 2018-10-09 DIAGNOSIS — R1032 Left lower quadrant pain: Secondary | ICD-10-CM | POA: Diagnosis not present

## 2018-10-09 DIAGNOSIS — M6281 Muscle weakness (generalized): Secondary | ICD-10-CM | POA: Diagnosis not present

## 2018-10-09 DIAGNOSIS — G9341 Metabolic encephalopathy: Secondary | ICD-10-CM | POA: Diagnosis not present

## 2018-10-12 ENCOUNTER — Encounter (HOSPITAL_COMMUNITY): Payer: Self-pay | Admitting: Emergency Medicine

## 2018-10-12 ENCOUNTER — Observation Stay (HOSPITAL_COMMUNITY): Payer: Medicare Other

## 2018-10-12 ENCOUNTER — Inpatient Hospital Stay (HOSPITAL_COMMUNITY)
Admission: EM | Admit: 2018-10-12 | Discharge: 2018-10-22 | DRG: 689 | Disposition: A | Discharge status: Skilled Nursing Facility | Payer: Medicare Other | Attending: Family Medicine | Admitting: Family Medicine

## 2018-10-12 ENCOUNTER — Other Ambulatory Visit: Payer: Self-pay

## 2018-10-12 ENCOUNTER — Emergency Department (HOSPITAL_COMMUNITY): Payer: Medicare Other

## 2018-10-12 DIAGNOSIS — E860 Dehydration: Secondary | ICD-10-CM | POA: Diagnosis present

## 2018-10-12 DIAGNOSIS — Z79891 Long term (current) use of opiate analgesic: Secondary | ICD-10-CM

## 2018-10-12 DIAGNOSIS — N183 Chronic kidney disease, stage 3 unspecified: Secondary | ICD-10-CM | POA: Diagnosis present

## 2018-10-12 DIAGNOSIS — F329 Major depressive disorder, single episode, unspecified: Secondary | ICD-10-CM | POA: Diagnosis present

## 2018-10-12 DIAGNOSIS — N179 Acute kidney failure, unspecified: Secondary | ICD-10-CM | POA: Diagnosis present

## 2018-10-12 DIAGNOSIS — F0281 Dementia in other diseases classified elsewhere with behavioral disturbance: Secondary | ICD-10-CM | POA: Diagnosis not present

## 2018-10-12 DIAGNOSIS — E039 Hypothyroidism, unspecified: Secondary | ICD-10-CM | POA: Diagnosis present

## 2018-10-12 DIAGNOSIS — K219 Gastro-esophageal reflux disease without esophagitis: Secondary | ICD-10-CM | POA: Diagnosis present

## 2018-10-12 DIAGNOSIS — G309 Alzheimer's disease, unspecified: Secondary | ICD-10-CM | POA: Diagnosis not present

## 2018-10-12 DIAGNOSIS — G9341 Metabolic encephalopathy: Secondary | ICD-10-CM | POA: Diagnosis not present

## 2018-10-12 DIAGNOSIS — R404 Transient alteration of awareness: Secondary | ICD-10-CM | POA: Diagnosis not present

## 2018-10-12 DIAGNOSIS — R410 Disorientation, unspecified: Secondary | ICD-10-CM | POA: Diagnosis not present

## 2018-10-12 DIAGNOSIS — I10 Essential (primary) hypertension: Secondary | ICD-10-CM | POA: Diagnosis not present

## 2018-10-12 DIAGNOSIS — I129 Hypertensive chronic kidney disease with stage 1 through stage 4 chronic kidney disease, or unspecified chronic kidney disease: Secondary | ICD-10-CM | POA: Diagnosis not present

## 2018-10-12 DIAGNOSIS — I700 Atherosclerosis of aorta: Secondary | ICD-10-CM | POA: Diagnosis not present

## 2018-10-12 DIAGNOSIS — Z7982 Long term (current) use of aspirin: Secondary | ICD-10-CM | POA: Diagnosis not present

## 2018-10-12 DIAGNOSIS — R946 Abnormal results of thyroid function studies: Secondary | ICD-10-CM | POA: Diagnosis present

## 2018-10-12 DIAGNOSIS — N390 Urinary tract infection, site not specified: Principal | ICD-10-CM | POA: Diagnosis present

## 2018-10-12 DIAGNOSIS — R4182 Altered mental status, unspecified: Secondary | ICD-10-CM | POA: Diagnosis present

## 2018-10-12 DIAGNOSIS — Z90710 Acquired absence of both cervix and uterus: Secondary | ICD-10-CM

## 2018-10-12 DIAGNOSIS — K5900 Constipation, unspecified: Secondary | ICD-10-CM | POA: Diagnosis present

## 2018-10-12 DIAGNOSIS — R4020 Unspecified coma: Secondary | ICD-10-CM | POA: Diagnosis not present

## 2018-10-12 DIAGNOSIS — S40012A Contusion of left shoulder, initial encounter: Secondary | ICD-10-CM | POA: Diagnosis not present

## 2018-10-12 DIAGNOSIS — E782 Mixed hyperlipidemia: Secondary | ICD-10-CM | POA: Diagnosis present

## 2018-10-12 DIAGNOSIS — Z79890 Hormone replacement therapy (postmenopausal): Secondary | ICD-10-CM

## 2018-10-12 DIAGNOSIS — Z79899 Other long term (current) drug therapy: Secondary | ICD-10-CM

## 2018-10-12 DIAGNOSIS — G9340 Encephalopathy, unspecified: Secondary | ICD-10-CM

## 2018-10-12 DIAGNOSIS — E876 Hypokalemia: Secondary | ICD-10-CM | POA: Diagnosis not present

## 2018-10-12 DIAGNOSIS — B961 Klebsiella pneumoniae [K. pneumoniae] as the cause of diseases classified elsewhere: Secondary | ICD-10-CM | POA: Diagnosis not present

## 2018-10-12 DIAGNOSIS — F419 Anxiety disorder, unspecified: Secondary | ICD-10-CM | POA: Diagnosis present

## 2018-10-12 DIAGNOSIS — R569 Unspecified convulsions: Secondary | ICD-10-CM | POA: Diagnosis present

## 2018-10-12 DIAGNOSIS — Z20828 Contact with and (suspected) exposure to other viral communicable diseases: Secondary | ICD-10-CM | POA: Diagnosis present

## 2018-10-12 DIAGNOSIS — G301 Alzheimer's disease with late onset: Secondary | ICD-10-CM | POA: Diagnosis not present

## 2018-10-12 DIAGNOSIS — Z8349 Family history of other endocrine, nutritional and metabolic diseases: Secondary | ICD-10-CM

## 2018-10-12 DIAGNOSIS — Z03818 Encounter for observation for suspected exposure to other biological agents ruled out: Secondary | ICD-10-CM | POA: Diagnosis not present

## 2018-10-12 DIAGNOSIS — Z882 Allergy status to sulfonamides status: Secondary | ICD-10-CM

## 2018-10-12 DIAGNOSIS — Z7951 Long term (current) use of inhaled steroids: Secondary | ICD-10-CM

## 2018-10-12 DIAGNOSIS — Z8249 Family history of ischemic heart disease and other diseases of the circulatory system: Secondary | ICD-10-CM

## 2018-10-12 DIAGNOSIS — F0390 Unspecified dementia without behavioral disturbance: Secondary | ICD-10-CM | POA: Diagnosis present

## 2018-10-12 LAB — COMPREHENSIVE METABOLIC PANEL
ALT: 14 U/L (ref 0–44)
AST: 21 U/L (ref 15–41)
Albumin: 3.7 g/dL (ref 3.5–5.0)
Alkaline Phosphatase: 64 U/L (ref 38–126)
Anion gap: 10 (ref 5–15)
BUN: 39 mg/dL — ABNORMAL HIGH (ref 8–23)
CO2: 23 mmol/L (ref 22–32)
Calcium: 9.4 mg/dL (ref 8.9–10.3)
Chloride: 110 mmol/L (ref 98–111)
Creatinine, Ser: 1.63 mg/dL — ABNORMAL HIGH (ref 0.44–1.00)
GFR calc Af Amer: 31 mL/min — ABNORMAL LOW (ref >60)
GFR calc non Af Amer: 27 mL/min — ABNORMAL LOW (ref >60)
Glucose, Bld: 105 mg/dL — ABNORMAL HIGH (ref 70–99)
Potassium: 4.2 mmol/L (ref 3.5–5.1)
Sodium: 143 mmol/L (ref 135–145)
Total Bilirubin: 1 mg/dL (ref 0.3–1.2)
Total Protein: 7 g/dL (ref 6.5–8.1)

## 2018-10-12 LAB — I-STAT CHEM 8, ED
BUN: 35 mg/dL — ABNORMAL HIGH (ref 8–23)
Calcium, Ion: 1.16 mmol/L (ref 1.15–1.40)
Chloride: 110 mmol/L (ref 98–111)
Creatinine, Ser: 1.6 mg/dL — ABNORMAL HIGH (ref 0.44–1.00)
Glucose, Bld: 97 mg/dL (ref 70–99)
HCT: 35 % — ABNORMAL LOW (ref 36.0–46.0)
Hemoglobin: 11.9 g/dL — ABNORMAL LOW (ref 12.0–15.0)
Potassium: 4.2 mmol/L (ref 3.5–5.1)
Sodium: 145 mmol/L (ref 135–145)
TCO2: 23 mmol/L (ref 22–32)

## 2018-10-12 LAB — DIFFERENTIAL
Abs Immature Granulocytes: 0.05 10*3/uL (ref 0.00–0.07)
Basophils Absolute: 0.1 10*3/uL (ref 0.0–0.1)
Basophils Relative: 1 %
Eosinophils Absolute: 0.1 10*3/uL (ref 0.0–0.5)
Eosinophils Relative: 1 %
Immature Granulocytes: 1 %
Lymphocytes Relative: 9 %
Lymphs Abs: 0.9 10*3/uL (ref 0.7–4.0)
Monocytes Absolute: 0.7 10*3/uL (ref 0.1–1.0)
Monocytes Relative: 7 %
Neutro Abs: 8.4 10*3/uL — ABNORMAL HIGH (ref 1.7–7.7)
Neutrophils Relative %: 81 %

## 2018-10-12 LAB — TSH: TSH: 5.368 u[IU]/mL — ABNORMAL HIGH (ref 0.350–4.500)

## 2018-10-12 LAB — VALPROIC ACID LEVEL: Valproic Acid Lvl: 29 ug/mL — ABNORMAL LOW (ref 50.0–100.0)

## 2018-10-12 LAB — SARS CORONAVIRUS 2 BY RT PCR (HOSPITAL ORDER, PERFORMED IN ~~LOC~~ HOSPITAL LAB): SARS Coronavirus 2: NEGATIVE

## 2018-10-12 LAB — URINALYSIS, ROUTINE W REFLEX MICROSCOPIC
Bilirubin Urine: NEGATIVE
Glucose, UA: NEGATIVE mg/dL
Ketones, ur: 5 mg/dL — AB
Nitrite: NEGATIVE
Protein, ur: 30 mg/dL — AB
Specific Gravity, Urine: 1.014 (ref 1.005–1.030)
WBC, UA: >50 WBC/hpf — ABNORMAL HIGH (ref 0–5)
pH: 7 (ref 5.0–8.0)

## 2018-10-12 LAB — CBC
HCT: 36.6 % (ref 36.0–46.0)
Hemoglobin: 11.2 g/dL — ABNORMAL LOW (ref 12.0–15.0)
MCH: 28.4 pg (ref 26.0–34.0)
MCHC: 30.6 g/dL (ref 30.0–36.0)
MCV: 92.7 fL (ref 80.0–100.0)
Platelets: 279 10*3/uL (ref 150–400)
RBC: 3.95 MIL/uL (ref 3.87–5.11)
RDW: 14.4 % (ref 11.5–15.5)
WBC: 10.2 10*3/uL (ref 4.0–10.5)
nRBC: 0 % (ref 0.0–0.2)

## 2018-10-12 LAB — AMMONIA: Ammonia: 11 umol/L (ref 9–35)

## 2018-10-12 LAB — APTT: aPTT: 27 seconds (ref 24–36)

## 2018-10-12 LAB — CBG MONITORING, ED: Glucose-Capillary: 104 mg/dL — ABNORMAL HIGH (ref 70–99)

## 2018-10-12 LAB — PROTIME-INR
INR: 1.1 (ref 0.8–1.2)
Prothrombin Time: 13.9 seconds (ref 11.4–15.2)

## 2018-10-12 MED ORDER — POLYETHYLENE GLYCOL 3350 17 G PO PACK
17.0000 g | PACK | Freq: Every day | ORAL | Status: DC | PRN
  Administered 2018-10-21: 17 g via ORAL
  Filled 2018-10-12: qty 1

## 2018-10-12 MED ORDER — DIVALPROEX SODIUM 125 MG PO DR TAB
125.0000 mg | DELAYED_RELEASE_TABLET | Freq: Three times a day (TID) | ORAL | Status: DC
  Administered 2018-10-12 – 2018-10-14 (×7): 125 mg via ORAL
  Filled 2018-10-12 (×15): qty 1

## 2018-10-12 MED ORDER — ENOXAPARIN SODIUM 30 MG/0.3ML ~~LOC~~ SOLN
30.0000 mg | SUBCUTANEOUS | Status: DC
  Administered 2018-10-13 – 2018-10-22 (×10): 30 mg via SUBCUTANEOUS
  Filled 2018-10-12 (×10): qty 0.3

## 2018-10-12 MED ORDER — SODIUM CHLORIDE 0.9 % IV SOLN
1.0000 g | INTRAVENOUS | Status: DC
  Administered 2018-10-13 – 2018-10-14 (×2): 1 g via INTRAVENOUS
  Filled 2018-10-12 (×2): qty 10

## 2018-10-12 MED ORDER — ACETAMINOPHEN 650 MG RE SUPP: 650.0000 mg | Freq: Four times a day (QID) | RECTAL | Status: DC | PRN

## 2018-10-12 MED ORDER — ACETAMINOPHEN 325 MG PO TABS
650.0000 mg | ORAL_TABLET | Freq: Four times a day (QID) | ORAL | Status: DC | PRN
  Administered 2018-10-19 – 2018-10-21 (×3): 650 mg via ORAL
  Filled 2018-10-12 (×3): qty 2

## 2018-10-12 MED ORDER — ONDANSETRON HCL 4 MG PO TABS: 4.0000 mg | ORAL_TABLET | Freq: Four times a day (QID) | ORAL | Status: DC | PRN

## 2018-10-12 MED ORDER — SODIUM CHLORIDE 0.9 % IV SOLN
INTRAVENOUS | Status: DC
  Administered 2018-10-12: 21:00:00 via INTRAVENOUS

## 2018-10-12 MED ORDER — LEVOTHYROXINE SODIUM 50 MCG PO TABS
50.0000 ug | ORAL_TABLET | Freq: Every day | ORAL | Status: DC
  Administered 2018-10-13 – 2018-10-22 (×9): 50 ug via ORAL
  Filled 2018-10-12 (×10): qty 1

## 2018-10-12 MED ORDER — SODIUM CHLORIDE 0.9 % IV SOLN
INTRAVENOUS | Status: DC
  Administered 2018-10-12: 17:00:00 via INTRAVENOUS

## 2018-10-12 MED ORDER — ONDANSETRON HCL 4 MG/2ML IJ SOLN: 4.0000 mg | Freq: Four times a day (QID) | INTRAMUSCULAR | Status: DC | PRN

## 2018-10-12 MED ORDER — LABETALOL HCL 5 MG/ML IV SOLN
10.0000 mg | INTRAVENOUS | Status: DC | PRN
  Administered 2018-10-12 – 2018-10-14 (×2): 10 mg via INTRAVENOUS
  Filled 2018-10-12 (×2): qty 4

## 2018-10-12 MED ORDER — LABETALOL HCL 5 MG/ML IV SOLN
5.0000 mg | INTRAVENOUS | Status: DC | PRN
  Administered 2018-10-12: 5 mg via INTRAVENOUS
  Filled 2018-10-12: qty 4

## 2018-10-12 MED ORDER — SODIUM CHLORIDE 0.9 % IV SOLN
1.0000 g | Freq: Once | INTRAVENOUS | Status: AC
  Administered 2018-10-12: 1 g via INTRAVENOUS
  Filled 2018-10-12: qty 10

## 2018-10-12 MED ORDER — ENSURE ENLIVE PO LIQD
237.0000 mL | Freq: Two times a day (BID) | ORAL | Status: DC
  Administered 2018-10-13 – 2018-10-22 (×18): 237 mL via ORAL

## 2018-10-12 MED ORDER — LORAZEPAM 2 MG/ML IJ SOLN
1.0000 mg | INTRAMUSCULAR | Status: AC | PRN
  Administered 2018-10-17 (×2): 1 mg via INTRAVENOUS
  Filled 2018-10-12 (×2): qty 1

## 2018-10-12 NOTE — ED Provider Notes (Signed)
[facility] EMERGENCY DEPARTMENT Provider Note   CSN: [id number] Arrival date & time: 10/12/18  1349     History   Chief Complaint Chief Complaint  Patient presents with   Altered Mental Status    HPI EMYLI FRED is a 85 y.o. female.     HPI  This patient is a 85 year old female, she is ill-appearing today as she presents with altered mental status.  It is unclear exactly what happened however the patient was last seen earlier this morning sitting in a wheelchair in her room and was later found unresponsive.  The patient is unable to give me any information, she mumbles incoherently.  She does appear to be somnolent but arousable to voice.  Based on the medical record medication chart that accompanies the patient she does take Depakote this is thought to be a mood stabilizer and not for seizures.  She also takes iron, levothyroxine, mag gastral, sertraline, Ativan as needed, trazodone for sleep and milk of magnesia and Maalox for constipation.  The patient does use a diclofenac topical gel for aches and pains.  Reports from the paramedics were that the patient did have some seizure-like activity in route to the hospital where she became very stiff.  She has no known history of seizures.  The stiffness was short-lived and resolved spontaneously.  There was no evidence of tongue biting or incontinence, the patient is not able to give me any information thus a level 5 caveat applies.  I have personally reviewed the medical record, the patient has been seen most recently in August on the fourth of 2020 in the office for hip pain, she was seen for Alzheimer's dementia late in July.  Going further back to June the patient was noted to be seen in the office on June 23 after having a fall early in June and being seen in the emergency department for altered mental status on the eighth.  Because of these recurring issue she got placed at [facility] where she currently lives.   At that time of the visit in June the patient was able to talk to the physician in the office and was noted to have normal coordination though generally weak and of limited cognition.  Past Medical History:  Diagnosis Date   Allergy    Anemia    Anxiety    Cataract    Depression    Diverticulitis    Essential hypertension    GERD (gastroesophageal reflux disease)    Hyperlipidemia    Hypothyroidism    Recurrent UTI     Patient Active Problem List   Diagnosis Date Noted   Hypokalemia 03/21/2018   Leg cramp 08/10/2017   Fatigue 08/10/2017   Hypothyroidism    Recurrent UTI    GERD (gastroesophageal reflux disease)    Depression    Cataract    Anxiety    Abnormal CT scan, colon 12/16/2015   Mood disorder ([place]) 11/20/2015   Dementia ([place]) 11/20/2015   CKD (chronic kidney disease), stage III ([place]) 11/20/2015   Aortic calcification ([place]) 02/26/2014   Essential hypertension 02/26/2014   Mixed hyperlipidemia 02/26/2014   Anemia 07/20/2013    Past Surgical History:  Procedure Laterality Date   APPENDECTOMY     EYE SURGERY     HERNIA REPAIR     TOTAL VAGINAL HYSTERECTOMY       OB History   No obstetric history on file.      Home Medications    Prior to  Admission medications   Medication Sig Start Date End Date Taking? Authorizing Provider  aspirin 81 MG tablet Take 1 tablet (81 mg total) by mouth daily. 03/19/18   Dettinger, Fransisca Kaufmann, MD  atorvastatin (LIPITOR) 20 MG tablet Take 1 tablet (20 mg total) by mouth daily at 6 PM. 11/04/17   Dettinger, Fransisca Kaufmann, MD  diclofenac sodium (VOLTAREN) 1 % GEL Apply 2 g topically 4 (four) times daily. 06/12/18   Dettinger, Fransisca Kaufmann, MD  divalproex (DEPAKOTE) 125 MG DR tablet Take 125 mg by mouth 3 (three) times daily.    [provider]  ergocalciferol (VITAMIN D2) 50000 UNITS capsule Take 50,000 Units by mouth once a week. Mon    [provider]  ferrous gluconate (FERGON) 324 MG  tablet Take 1 tablet (324 mg total) by mouth daily with breakfast. 08/18/18   Dettinger, Fransisca Kaufmann, MD  fluticasone (FLONASE) 50 MCG/ACT nasal spray Place 2 sprays into both nostrils daily. Patient taking differently: Place 2 sprays into both nostrils daily as needed for allergies.  08/02/17   Timmothy Euler, MD  megestrol (MEGACE) 20 MG tablet Take 20 mg by mouth daily. 03/17/18   [provider]  Multiple Vitamins-Minerals (CENTRUM SILVER PO) Take 1 tablet by mouth daily.    [provider]  potassium chloride SA (K-DUR) 20 MEQ tablet Take 1 tablet (20 mEq total) by mouth 2 (two) times daily. 07/24/18   Evalee Jefferson, PA-C  sertraline (ZOLOFT) 50 MG tablet Take 1 tablet (50 mg total) by mouth daily. 09/11/18   Dettinger, Fransisca Kaufmann, MD  SYNTHROID 50 MCG tablet Take 1 tablet (50 mcg total) by mouth daily. 11/04/17   Dettinger, Fransisca Kaufmann, MD  traZODone (DESYREL) 50 MG tablet  09/18/18   [provider]    Family History Family History  Problem Relation Age of Onset   Stroke Mother    Miscarriages / [place] Mother    Hyperlipidemia Mother    Hypertension Mother    Heart attack Father    Hyperlipidemia Father    Hypertension Father    Hearing loss Father    Miscarriages / Stillbirths Sister    Heart attack Maternal Grandmother    Heart attack Maternal Grandfather    Heart attack Paternal Grandmother    Heart attack Paternal Grandfather    Diabetes Other    Heart disease Other    Kidney disease Other    Heart attack Brother    Cancer Sister     Social History Social History   Tobacco Use   Smoking status: Never Smoker   Smokeless tobacco: Never Used  Substance Use Topics   Alcohol use: No    Alcohol/week: 0.0 standard drinks   Drug use: No     Allergies   Sulfa antibiotics   Review of Systems Review of Systems  Unable to perform ROS: Dementia     Physical Exam Updated Vital Signs BP (!) 166/75    Pulse 78    Temp 98.3  F (36.8 C) (Rectal)    Resp 20    Ht 1.676 m (5\' 6" )    Wt 48.4 kg    SpO2 98%    BMI 17.22 kg/m   Physical Exam Vitals signs and nursing note reviewed.  Constitutional:      Appearance: She is well-developed. She is ill-appearing.  HENT:     Head: Normocephalic and atraumatic.     Comments: Mucous membranes appear dry, no biting of the tongue  Mouth/Throat:     Pharynx: No oropharyngeal exudate.  Eyes:     General: No scleral icterus.       Right eye: No discharge.        Left eye: No discharge.     Conjunctiva/sclera: Conjunctivae normal.     Pupils: Pupils are equal, round, and reactive to light.  Neck:     Musculoskeletal: Normal range of motion and neck supple.     Thyroid: No thyromegaly.     Vascular: No JVD.  Cardiovascular:     Rate and Rhythm: Normal rate and regular rhythm.     Heart sounds: Normal heart sounds. No murmur. No friction rub. No gallop.   Pulmonary:     Effort: Pulmonary effort is normal. No respiratory distress.     Breath sounds: Normal breath sounds. No wheezing or rales.  Abdominal:     General: Bowel sounds are normal. There is no distension.     Palpations: Abdomen is soft. There is no mass.     Tenderness: There is no abdominal tenderness.  Musculoskeletal:        General: No tenderness.     Comments: The patient has bruising over the left shoulder, there is a skin tear to the right elbow, she has flexion of all 4 extremities, I can pull her limbs straight but she immediately flexes them back.  She is not following commands  Lymphadenopathy:     Cervical: No cervical adenopathy.  Skin:    General: Skin is warm and dry.     Findings: No erythema or rash.     Comments: Bruising and skin tears as noted  Neurological:     Mental Status: She is alert.     Comments: The patient is somnolent but arousable to voice, she moans incoherently and is not able to answer my questions though she does appear to be mumbling in response to the questions.   She does not follow commands, she is looking from side to side tracking me around the room.  Psychiatric:        Behavior: Behavior normal.     ED Treatments / Results  Labs (all labs ordered are listed, but only abnormal results are displayed) Labs Reviewed  CBC - Abnormal; Notable for the following components:      Result Value   Hemoglobin 11.2 (*)    All other components within normal limits  DIFFERENTIAL - Abnormal; Notable for the following components:   Neutro Abs 8.4 (*)    All other components within normal limits  COMPREHENSIVE METABOLIC PANEL - Abnormal; Notable for the following components:   Glucose, Bld 105 (*)    BUN 39 (*)    Creatinine, Ser 1.63 (*)    GFR calc non Af Amer 27 (*)    GFR calc Af Amer 31 (*)    All other components within normal limits  URINALYSIS, ROUTINE W REFLEX MICROSCOPIC - Abnormal; Notable for the following components:   APPearance CLOUDY (*)    Hgb urine dipstick SMALL (*)    Ketones, ur 5 (*)    Protein, ur 30 (*)    Leukocytes,Ua MODERATE (*)    WBC, UA >50 (*)    Bacteria, UA MANY (*)    All other components within normal limits  VALPROIC ACID LEVEL - Abnormal; Notable for the following components:   Valproic Acid Lvl 29 (*)    All other components within normal limits  I-STAT CHEM 8, ED - Abnormal; Notable  for the following components:   BUN 35 (*)    Creatinine, Ser 1.60 (*)    Hemoglobin 11.9 (*)    HCT 35.0 (*)    All other components within normal limits  CBG MONITORING, ED - Abnormal; Notable for the following components:   Glucose-Capillary 104 (*)    All other components within normal limits  URINE CULTURE  SARS CORONAVIRUS 2 (HOSPITAL ORDER, [facility])  PROTIME-INR  APTT  AMMONIA    EKG EKG Interpretation  Date/Time:  Sunday October 12 2018 13:58:31 EDT Ventricular Rate:  78 PR Interval:    QRS Duration: 76 QT Interval:  417 QTC Calculation: 475 R Axis:   -17 Text  Interpretation:  Sinus rhythm Borderline left axis deviation Abnormal R-wave progression, early transition Confirmed by Noemi Chapel [phone]) on 10/12/2018 3:03:13 PM   Radiology Ct Head Wo Contrast  Result Date: 10/12/2018 CLINICAL DATA:  Altered level of consciousness EXAM: CT HEAD WITHOUT CONTRAST TECHNIQUE: Contiguous axial images were obtained from the base of the skull through the vertex without intravenous contrast. COMPARISON:  08/31/2018 FINDINGS: Brain: There is atrophy and chronic small vessel disease changes. No acute intracranial abnormality. Specifically, no hemorrhage, hydrocephalus, mass lesion, acute infarction, or significant intracranial injury. Vascular: No hyperdense vessel or unexpected calcification. Skull: No acute calvarial abnormality. Sinuses/Orbits: Visualized paranasal sinuses and mastoids clear. Orbital soft tissues unremarkable. Other: None IMPRESSION: Atrophy, chronic microvascular disease. No acute intracranial abnormality. Electronically Signed   By: Rolm Baptise M.D.   On: 10/12/2018 16:08    Procedures .Critical Care Performed by: Noemi Chapel, MD Authorized by: Noemi Chapel, MD   Critical care provider statement:    Critical care time (minutes):  35   Critical care was necessary to treat or prevent imminent or life-threatening deterioration of the following conditions:  CNS failure or compromise   Critical care was time spent personally by me on the following activities:  Discussions with consultants, evaluation of patient's response to treatment, examination of patient, ordering and performing treatments and interventions, ordering and review of laboratory studies, ordering and review of radiographic studies, pulse oximetry, re-evaluation of patient's condition, obtaining history from patient or surrogate and review of old charts   (including critical care time)  Medications Ordered in ED Medications  cefTRIAXone (ROCEPHIN) 1 g in sodium chloride 0.9 %  100 mL IVPB (1 g Intravenous New Bag/Given 10/12/18 1530)     Initial Impression / Assessment and Plan / ED Course  I have reviewed the triage vital signs and the nursing notes.  Pertinent labs & imaging results that were available during my care of the patient were reviewed by me and considered in my medical decision making (see chart for details).  Clinical Course as of Oct 11 1633  Sun Oct 12, 2018  1633 This patient has been reevaluated multiple times, she has had no significant change in her underlying mental status, she is still somnolent, she is still confused, she has no tachycardia or fever and no hypoxia.  She has had an unchanged neurologic exam.  The CT scan of the head does not show any signs of acute hemorrhage or infarction, it is unchanged and consistent with volume loss.  Depakote level is slightly low but not supratherapeutic and her ammonia level is normal.  Will obtain x-ray of the shoulder, she will need to be admitted to the hospital, Rocephin has been given for an antibiotic.  Thankfully she does not appear to be septic or  in shock.   [BM]  [id number] Gentle hydration has been given for her acute kidney injury.   [BM]    Clinical Course User Index [BM] Noemi Chapel, MD      The patient's work-up thus far shows an abnormal physical exam with left shoulder contusion and bruising with ecchymosis as well as a right elbow skin tear.  She is very confused altered and has difficulty speaking or following commands suggesting a postictal state especially given the abnormal rigidity that she exhibited to the paramedics.  Her EKG is unremarkable, the lab work thus far shows that she has a increase in her creatinine which is 1.6 which is up from 1.3 in June, hemoglobin is at baseline.  Electrolytes are unremarkable but there is a significant urinary tract infection.  Urinalysis reveals many bacteria, greater than 50 white blood cells, 5 ketones, moderate leukocytes, negative nitrites.   Blood sugar is normal, will add CT scan of the brain to evaluate for other sources of seizure and a level for Depakote.  The patient will likely need to be admitted.  Summary  #1 altered mental status likely secondary to postictal state, the patient also has a urinary tract infection however through multiple neurologic exams and repeat evaluation with cardiac monitoring, neurologic frequent evaluations the patient has had no significant improvement.  She is still able to wake up but is confused and her speech.  Depakote level slightly low, no elevation in ammonia, no leukocytosis, no findings on CT scan to explain.  No antiepileptics were given as this was the first seizure.  #2 urinary tract infection-Rocephin given, IV fluids for the acute kidney injury, culture pending  #3.  Acute kidney injury, fluids given to hydrate, no need for dialysis or nephrology consultation at this time  #4.  Patient hospitalist for admission  I called the patient's next of kin, there was no answer on either the home line or the cell phone.  dW Dr. Denton Brick - will admit  Final Clinical Impressions(s) / ED Diagnoses   Final diagnoses:  Seizure [facility])  Urinary tract infection without hematuria, site unspecified  Acute encephalopathy  Acute kidney injury [facility])      Noemi Chapel, MD 10/12/18 1642

## 2018-10-12 NOTE — ED Notes (Signed)
Pt to CT

## 2018-10-12 NOTE — H&P (Addendum)
History and Physical    Deborah Rivers [id number] DOB: Jun 29, 1924 DOA: 10/12/2018  PCP: Dettinger, Fransisca Kaufmann, MD   Patient coming from: [facility], [place].  I have personally briefly reviewed patient's old medical records in [place]  Chief Complaint: Altered mental status  HPI: Deborah Rivers is a 85 y.o. female with medical history significant for hypertension, CKD 3, depression and anxiety, brought to the ED with reports of unresponsiveness.  History is obtained from chart review, I reached out to the nursing home-unfortunately got a recorded message, I talked to patient's son Robert-listed as patient contact, he has not seen patient in a long while. As at the time of my evaluation patient though awake, is muttering incoherently, repeating the same words over. Patient's was last seen normal sitting in her room in her wheelchair earlier this morning, later she was found unresponsive.  Per paramedics, patient did have some seizure-like activity in route to the hospital when she became stiff.  Stiffness was short-lived and resolved spontaneously.  There was no tongue biting or incontinence.  Patient's son Herbie [place] denies knowing any history of seizures, reports prior episodes of confusion in the setting of UTI, last 02/2018.  ED Course: Blood pressure systolic [id number] to [id number], O2 sats greater than 94% on nasal cannula, heart rate 70s to 80s, UA with moderate leukocytes greater than 50 WBCs and many bacteria.  WBC 10.2.  Creatinine elevated 1.63.  Reported acid level 29.  Head CT negative for acute abnormality EKG sinus rhythm without significant change from prior.  IV fluid normal saline 150 cc/hr started in ED, IV ceftriaxone given for UTI.  Hospitalist to admit for altered mental status.  Review of Systems: Unable to assess due to patient's mental status.  Past Medical History:  Diagnosis Date  . Allergy   . Anemia   . Anxiety   . Cataract   . Depression   .  Diverticulitis   . Essential hypertension   . GERD (gastroesophageal reflux disease)   . Hyperlipidemia   . Hypothyroidism   . Recurrent UTI     Past Surgical History:  Procedure Laterality Date  . APPENDECTOMY    . EYE SURGERY    . HERNIA REPAIR    . TOTAL VAGINAL HYSTERECTOMY       reports that she has never smoked. She has never used smokeless tobacco. She reports that she does not drink alcohol or use drugs.  Allergies  Allergen Reactions  . Sulfa Antibiotics Nausea Only, Anxiety and Rash    Family History  Problem Relation Age of Onset  . Stroke Mother   . Miscarriages / [place] Mother   . Hyperlipidemia Mother   . Hypertension Mother   . Heart attack Father   . Hyperlipidemia Father   . Hypertension Father   . Hearing loss Father   . Miscarriages / Stillbirths Sister   . Heart attack Maternal Grandmother   . Heart attack Maternal Grandfather   . Heart attack Paternal Grandmother   . Heart attack Paternal Grandfather   . Diabetes Other   . Heart disease Other   . Kidney disease Other   . Heart attack Brother   . Cancer Sister     Prior to Admission medications   Medication Sig Start Date End Date Taking? Authorizing Provider  atorvastatin (LIPITOR) 20 MG tablet Take 1 tablet (20 mg total) by mouth daily at 6 PM. 11/04/17  Yes Dettinger, Fransisca Kaufmann, MD  diclofenac sodium (VOLTAREN) 1 %  GEL Apply 2 g topically 4 (four) times daily. 06/12/18  Yes Dettinger, Fransisca Kaufmann, MD  divalproex (DEPAKOTE) 125 MG DR tablet Take 125 mg by mouth 3 (three) times daily.   Yes [provider]  ergocalciferol (VITAMIN D2) 50000 UNITS capsule Take 50,000 Units by mouth once a week. Mon   Yes [provider]  ferrous gluconate (FERGON) 324 MG tablet Take 1 tablet (324 mg total) by mouth daily with breakfast. 08/18/18  Yes Dettinger, Fransisca Kaufmann, MD  fluticasone (FLONASE) 50 MCG/ACT nasal spray Place 2 sprays into both nostrils daily. Patient taking differently: Place 2  sprays into both nostrils daily as needed for allergies.  08/02/17  Yes Timmothy Euler, MD  potassium chloride SA (K-DUR) 20 MEQ tablet Take 1 tablet (20 mEq total) by mouth 2 (two) times daily. 07/24/18  Yes Idol, Almyra Free, PA-C  sertraline (ZOLOFT) 50 MG tablet Take 1 tablet (50 mg total) by mouth daily. 09/11/18  Yes Dettinger, Fransisca Kaufmann, MD  SYNTHROID 50 MCG tablet Take 1 tablet (50 mcg total) by mouth daily. 11/04/17  Yes Dettinger, Fransisca Kaufmann, MD  traZODone (DESYREL) 50 MG tablet  09/18/18  Yes [provider]  aspirin 81 MG tablet Take 1 tablet (81 mg total) by mouth daily. 03/19/18   Dettinger, Fransisca Kaufmann, MD  megestrol (MEGACE) 20 MG tablet Take 20 mg by mouth daily. 03/17/18   [provider]  Multiple Vitamins-Minerals (CENTRUM SILVER PO) Take 1 tablet by mouth daily.    [provider]    Physical Exam: Exam limited by patient's mental status. Vitals:   10/12/18 1435 10/12/18 1500 10/12/18 1530 10/12/18 1630  BP: (!) 166/75 (!) 176/78 (!) 150/80 (!) 161/70  Pulse: 78 70 82 81  Resp: 20 15 18  (!) 25  Temp:      TempSrc:      SpO2: 98% 94% 98% 99%  Weight:      Height:        Constitutional: Awake but lethargic, ill-appearing, Muttering incoherently Vitals:   10/12/18 1435 10/12/18 1500 10/12/18 1530 10/12/18 1630  BP: (!) 166/75 (!) 176/78 (!) 150/80 (!) 161/70  Pulse: 78 70 82 81  Resp: 20 15 18  (!) 25  Temp:      TempSrc:      SpO2: 98% 94% 98% 99%  Weight:      Height:       Eyes: PERRL, lids and conjunctivae normal ENMT: Mucous membranes are very dry.  Posterior pharynx clear of any exudate or lesions. Neck: normal, supple, no masses, no thyromegaly Respiratory: clear to auscultation bilaterally, no wheezing, no crackles. Normal respiratory effort. No accessory muscle use.  Cardiovascular: Regular rate and rhythm, no murmurs / rubs / gallops. No extremity edema. 2+ pedal pulses.   Abdomen: no tenderness, no masses palpated. No hepatosplenomegaly.  Bowel sounds positive.  Musculoskeletal: no clubbing / cyanosis.  Arthritic changes to hands, otherwise no joint deformity upper and lower extremities. Good ROM, no contractures. Normal muscle tone.  Skin: no rashes, lesions, ulcers. No induration Neurologic: Unable to fully assess, patient verbalizing but incoherently, tracks with her eyes, not following directions.  Moving upper extremities but not spontaneously, not moving lower extremities. Psychiatric: Unable to assess.  Labs on Admission: I have personally reviewed following labs and imaging studies  CBC: Recent Labs  Lab 10/12/18 1428 10/12/18 1441  WBC 10.2  --   NEUTROABS 8.4*  --   HGB 11.2* 11.9*  HCT 36.6 35.0*  MCV 92.7  --  PLT 279  --    Basic Metabolic Panel: Recent Labs  Lab 10/12/18 1428 10/12/18 1441  NA 143 145  K 4.2 4.2  CL 110 110  CO2 23  --   GLUCOSE 105* 97  BUN 39* 35*  CREATININE 1.63* 1.60*  CALCIUM 9.4  --    Liver Function Tests: Recent Labs  Lab 10/12/18 1428  AST 21  ALT 14  ALKPHOS 64  BILITOT 1.0  PROT 7.0  ALBUMIN 3.7    Recent Labs  Lab 10/12/18 1537  AMMONIA 11   Coagulation Profile: Recent Labs  Lab 10/12/18 1428  INR 1.1   CBG: Recent Labs  Lab 10/12/18 1417  GLUCAP 104*   Urine analysis:    Component Value Date/Time   COLORURINE YELLOW 10/12/2018 1420   APPEARANCEUR CLOUDY (A) 10/12/2018 1420   APPEARANCEUR Clear 07/16/2018 1621   LABSPEC 1.014 10/12/2018 1420   PHURINE 7.0 10/12/2018 1420   GLUCOSEU NEGATIVE 10/12/2018 1420   HGBUR SMALL (A) 10/12/2018 1420   BILIRUBINUR NEGATIVE 10/12/2018 1420   BILIRUBINUR Negative 07/16/2018 1621   KETONESUR 5 (A) 10/12/2018 1420   PROTEINUR 30 (A) 10/12/2018 1420   UROBILINOGEN 1.0 06/16/2014 1825   NITRITE NEGATIVE 10/12/2018 1420   LEUKOCYTESUR MODERATE (A) 10/12/2018 1420    Radiological Exams on Admission: Ct Head Wo Contrast  Result Date: 10/12/2018 CLINICAL DATA:  Altered level of consciousness  EXAM: CT HEAD WITHOUT CONTRAST TECHNIQUE: Contiguous axial images were obtained from the base of the skull through the vertex without intravenous contrast. COMPARISON:  08/31/2018 FINDINGS: Brain: There is atrophy and chronic small vessel disease changes. No acute intracranial abnormality. Specifically, no hemorrhage, hydrocephalus, mass lesion, acute infarction, or significant intracranial injury. Vascular: No hyperdense vessel or unexpected calcification. Skull: No acute calvarial abnormality. Sinuses/Orbits: Visualized paranasal sinuses and mastoids clear. Orbital soft tissues unremarkable. Other: None IMPRESSION: Atrophy, chronic microvascular disease. No acute intracranial abnormality. Electronically Signed   By: Rolm Baptise M.D.   On: 10/12/2018 16:08    EKG: Independently reviewed.  Sinus rhythm rates 78.  QTc 475.  No significant ST or T wave changes compared to prior.  Assessment/Plan Active Problems:   AMS (altered mental status)  Metabolic encephalopathy- likely multifactorial from UTI and dehydration.  With marked dry mucous membranes and mildly elevated creatinine-1.63.  Afebrile, WBC 10.2.  Patient rules out for sepsis.  UA moderate leukocytes greater than 50 WBCs and many bacteria.  Urine cultures from 03/21/18- grew Enterobacter sensitive to ceftriaxone.  Head CT negative for acute abnormality.  Reported stiffening en route, ?  Seizure activity by paramedics.  No history of seizures.  Reported vaproic acid level 29.  Ammonia WNL 11. -Continue IV ceftriaxone started in ED -Hydrate -Portable chest x-ray-no active disease. -F/u urine cultures -Ativan PRN -EEG -MRI brain -Neurology consult -Ativan as needed seizures -Magnesium, phosphorus, TSH -Check CK and prolactin -Hold sertraline with alteration in mental status  Acute on chronic kidney disease-creatinine 1.63, baseline 1.1- 1.2.  Likely prerenal patient appears dehydrated.  Fluids normal saline started in the ED -Continue at  N/s 100cc/hr x 1 day - BMP a.m - CK  Hypertension- Elevated.  Not on antihypertensives. -PRN labetalol  Onset Alzheimer's disease with behavioral disturbance, depression, Anxiety-nursing home resident.  On Depakote for mood stabilization.  Per notes Xanax discontinued 7/23, with concerns for recurrent falls and hallucinations..  Doubt this is related to ?? seizure activity as it is so far out. -Resume home Depakote  DVT prophylaxis: Lovenox Code  Status: Full, talked to patient's son Herbie [place] and daughter-in-law Kieth Brightly, patient has a living will- that states resuscitate unless vegetative state. Family Communication: Renny Snelgrove, Patients Son and daughter-in-law Kieth Brightly..  Healthcare power of attorney is daughter Romie Minus Cure. Disposition Plan: Per rounding team Consults called: None Admission status: Obs, telemetry   Bethena Roys MD [facility]  10/12/2018, 8:42 PM

## 2018-10-12 NOTE — ED Notes (Signed)
Patient Contact Notified

## 2018-10-12 NOTE — ED Notes (Signed)
Family does NOT want patient transported to [facility] by EMS

## 2018-10-12 NOTE — Plan of Care (Signed)
  Problem: Activity: Goal: Risk for activity intolerance will decrease Outcome: Progressing   Problem: Nutrition: Goal: Adequate nutrition will be maintained Outcome: Progressing   Problem: Elimination: Goal: Will not experience complications related to bowel motility Outcome: Progressing   

## 2018-10-13 ENCOUNTER — Observation Stay (HOSPITAL_COMMUNITY): Payer: Medicare Other

## 2018-10-13 ENCOUNTER — Ambulatory Visit: Payer: Medicare Other | Admitting: Family Medicine

## 2018-10-13 ENCOUNTER — Observation Stay (HOSPITAL_COMMUNITY)
Admit: 2018-10-13 | Discharge: 2018-10-13 | Disposition: A | Discharge status: Home / Self Care | Payer: Medicare Other | Attending: Family Medicine | Admitting: Family Medicine

## 2018-10-13 DIAGNOSIS — F0281 Dementia in other diseases classified elsewhere with behavioral disturbance: Secondary | ICD-10-CM | POA: Diagnosis present

## 2018-10-13 DIAGNOSIS — G9341 Metabolic encephalopathy: Secondary | ICD-10-CM | POA: Diagnosis present

## 2018-10-13 DIAGNOSIS — I10 Essential (primary) hypertension: Secondary | ICD-10-CM | POA: Diagnosis not present

## 2018-10-13 DIAGNOSIS — G301 Alzheimer's disease with late onset: Secondary | ICD-10-CM

## 2018-10-13 DIAGNOSIS — N179 Acute kidney failure, unspecified: Secondary | ICD-10-CM | POA: Diagnosis not present

## 2018-10-13 DIAGNOSIS — R946 Abnormal results of thyroid function studies: Secondary | ICD-10-CM | POA: Diagnosis present

## 2018-10-13 DIAGNOSIS — Z90710 Acquired absence of both cervix and uterus: Secondary | ICD-10-CM | POA: Diagnosis not present

## 2018-10-13 DIAGNOSIS — I700 Atherosclerosis of aorta: Secondary | ICD-10-CM | POA: Diagnosis present

## 2018-10-13 DIAGNOSIS — N390 Urinary tract infection, site not specified: Principal | ICD-10-CM

## 2018-10-13 DIAGNOSIS — G9340 Encephalopathy, unspecified: Secondary | ICD-10-CM

## 2018-10-13 DIAGNOSIS — K219 Gastro-esophageal reflux disease without esophagitis: Secondary | ICD-10-CM | POA: Diagnosis present

## 2018-10-13 DIAGNOSIS — R0902 Hypoxemia: Secondary | ICD-10-CM | POA: Diagnosis not present

## 2018-10-13 DIAGNOSIS — N183 Chronic kidney disease, stage 3 (moderate): Secondary | ICD-10-CM | POA: Diagnosis not present

## 2018-10-13 DIAGNOSIS — Z7401 Bed confinement status: Secondary | ICD-10-CM | POA: Diagnosis not present

## 2018-10-13 DIAGNOSIS — F419 Anxiety disorder, unspecified: Secondary | ICD-10-CM | POA: Diagnosis present

## 2018-10-13 DIAGNOSIS — E876 Hypokalemia: Secondary | ICD-10-CM | POA: Diagnosis present

## 2018-10-13 DIAGNOSIS — Z79890 Hormone replacement therapy (postmenopausal): Secondary | ICD-10-CM | POA: Diagnosis not present

## 2018-10-13 DIAGNOSIS — F329 Major depressive disorder, single episode, unspecified: Secondary | ICD-10-CM | POA: Diagnosis present

## 2018-10-13 DIAGNOSIS — Z79891 Long term (current) use of opiate analgesic: Secondary | ICD-10-CM | POA: Diagnosis not present

## 2018-10-13 DIAGNOSIS — Z20828 Contact with and (suspected) exposure to other viral communicable diseases: Secondary | ICD-10-CM | POA: Diagnosis present

## 2018-10-13 DIAGNOSIS — G309 Alzheimer's disease, unspecified: Secondary | ICD-10-CM | POA: Diagnosis present

## 2018-10-13 DIAGNOSIS — E782 Mixed hyperlipidemia: Secondary | ICD-10-CM | POA: Diagnosis present

## 2018-10-13 DIAGNOSIS — R410 Disorientation, unspecified: Secondary | ICD-10-CM | POA: Diagnosis not present

## 2018-10-13 DIAGNOSIS — Z7982 Long term (current) use of aspirin: Secondary | ICD-10-CM | POA: Diagnosis not present

## 2018-10-13 DIAGNOSIS — E860 Dehydration: Secondary | ICD-10-CM | POA: Diagnosis present

## 2018-10-13 DIAGNOSIS — B961 Klebsiella pneumoniae [K. pneumoniae] as the cause of diseases classified elsewhere: Secondary | ICD-10-CM | POA: Diagnosis present

## 2018-10-13 DIAGNOSIS — R569 Unspecified convulsions: Secondary | ICD-10-CM | POA: Diagnosis not present

## 2018-10-13 DIAGNOSIS — E039 Hypothyroidism, unspecified: Secondary | ICD-10-CM | POA: Diagnosis present

## 2018-10-13 DIAGNOSIS — K5900 Constipation, unspecified: Secondary | ICD-10-CM | POA: Diagnosis present

## 2018-10-13 DIAGNOSIS — I129 Hypertensive chronic kidney disease with stage 1 through stage 4 chronic kidney disease, or unspecified chronic kidney disease: Secondary | ICD-10-CM | POA: Diagnosis present

## 2018-10-13 DIAGNOSIS — M6281 Muscle weakness (generalized): Secondary | ICD-10-CM | POA: Diagnosis not present

## 2018-10-13 LAB — BASIC METABOLIC PANEL
Anion gap: 9 (ref 5–15)
BUN: 33 mg/dL — ABNORMAL HIGH (ref 8–23)
CO2: 23 mmol/L (ref 22–32)
Calcium: 8.8 mg/dL — ABNORMAL LOW (ref 8.9–10.3)
Chloride: 113 mmol/L — ABNORMAL HIGH (ref 98–111)
Creatinine, Ser: 1.09 mg/dL — ABNORMAL HIGH (ref 0.44–1.00)
GFR calc Af Amer: 51 mL/min — ABNORMAL LOW (ref >60)
GFR calc non Af Amer: 44 mL/min — ABNORMAL LOW (ref >60)
Glucose, Bld: 83 mg/dL (ref 70–99)
Potassium: 3.3 mmol/L — ABNORMAL LOW (ref 3.5–5.1)
Sodium: 145 mmol/L (ref 135–145)

## 2018-10-13 LAB — CBC
HCT: 32.6 % — ABNORMAL LOW (ref 36.0–46.0)
Hemoglobin: 9.9 g/dL — ABNORMAL LOW (ref 12.0–15.0)
MCH: 28 pg (ref 26.0–34.0)
MCHC: 30.4 g/dL (ref 30.0–36.0)
MCV: 92.1 fL (ref 80.0–100.0)
Platelets: 254 10*3/uL (ref 150–400)
RBC: 3.54 MIL/uL — ABNORMAL LOW (ref 3.87–5.11)
RDW: 14.2 % (ref 11.5–15.5)
WBC: 8.1 10*3/uL (ref 4.0–10.5)
nRBC: 0 % (ref 0.0–0.2)

## 2018-10-13 LAB — CK: Total CK: 50 U/L (ref 38–234)

## 2018-10-13 LAB — PHOSPHORUS: Phosphorus: 4.3 mg/dL (ref 2.5–4.6)

## 2018-10-13 LAB — MAGNESIUM: Magnesium: 2.3 mg/dL (ref 1.7–2.4)

## 2018-10-13 MED ORDER — ADULT MULTIVITAMIN W/MINERALS CH
1.0000 | ORAL_TABLET | Freq: Every day | ORAL | Status: DC
  Administered 2018-10-13 – 2018-10-22 (×10): 1 via ORAL
  Filled 2018-10-13 (×10): qty 1

## 2018-10-13 MED ORDER — POTASSIUM CHLORIDE CRYS ER 20 MEQ PO TBCR
40.0000 meq | EXTENDED_RELEASE_TABLET | Freq: Once | ORAL | Status: DC
  Filled 2018-10-13: qty 2

## 2018-10-13 MED ORDER — ASPIRIN EC 81 MG PO TBEC
81.0000 mg | DELAYED_RELEASE_TABLET | Freq: Every day | ORAL | Status: DC
  Administered 2018-10-14 – 2018-10-22 (×9): 81 mg via ORAL
  Filled 2018-10-13 (×9): qty 1

## 2018-10-13 MED ORDER — AMLODIPINE BESYLATE 5 MG PO TABS
5.0000 mg | ORAL_TABLET | Freq: Every day | ORAL | Status: DC
  Administered 2018-10-14 – 2018-10-19 (×6): 5 mg via ORAL
  Filled 2018-10-13 (×6): qty 1

## 2018-10-13 MED ORDER — ATORVASTATIN CALCIUM 20 MG PO TABS
20.0000 mg | ORAL_TABLET | Freq: Every day | ORAL | Status: DC
  Administered 2018-10-14 – 2018-10-21 (×8): 20 mg via ORAL
  Filled 2018-10-13 (×10): qty 1

## 2018-10-13 MED ORDER — POTASSIUM CHLORIDE IN NACL 20-0.45 MEQ/L-% IV SOLN
INTRAVENOUS | Status: DC
  Administered 2018-10-13 – 2018-10-21 (×10): via INTRAVENOUS
  Filled 2018-10-13: qty 1000

## 2018-10-13 NOTE — Progress Notes (Signed)
PT REMAINS CONFUSED, CALLING OUT FOR HELP TO GO HOME, CURRENTLY NOT ATTEMPTING TO GET OOB, BED ALARM ACTIVATED, SIDERAILS UP, CALL BELL WITHIN REACH.

## 2018-10-13 NOTE — Progress Notes (Signed)
EEG complete - results pending 

## 2018-10-13 NOTE — Consult Note (Addendum)
Deborah A. Merlene Laughter, MD     [email or web address]          Deborah Rivers is an 85 y.o. female.   ASSESSMENT/PLAN: 1.  Acute encephalopathy due to toxic metabolic processes including UTI.  2.  Gait impairment due to above.  3.  Baseline cognitive impairment.  4.  Suspicion of seizure activity.  This could be provoked and even if unprovoked, it is a single event and does not warrant long-term seizure medications.  The patient is a 85 year old white female who is a resident of the nursing home facility.  Presents with acute confusion altered mental status and gait impairment.  There appears to been a suspicion of shaking activity.  Work-up was significant for UTI.  There appears to be some cognitive impairment at baseline.  The review of systems is limited due to the impaired cognition.   GENERAL: She is awake and alert and cooperates with evaluation at times.  Other times she is resistant.  HEENT: This is supple no trauma appreciated.  ABDOMEN: soft  EXTREMITIES: No edema; Significant arthritic changes noted.  BACK: Normal  SKIN: Normal by inspection.    MENTAL STATUS: She is awake and alert.  She thinks that she is at home.  There is no evidence of dysarthria.  Speech is mostly nonsensical.  She does not follow commands consistently.  CRANIAL NERVES: Pupils are equal, round and reactive to light; extra ocular movements are full, there is no significant nystagmus; visual fields are full; upper and lower facial muscles are normal in strength and symmetric, there is no flattening of the nasolabial folds  MOTOR: She has good movements in the upper extremities particularly and also somewhat in the lower extremities.  COORDINATION: There is no evidence of tremors, dysmetria or parkinsonism.  REFLEXES: Deep tendon reflexes are symmetrical and normal.    SENSATION: Normal to pain.      Blood pressure (!) 155/74, pulse 82, temperature 98 F (36.7 C),  temperature source Oral, resp. rate 18, height 5\' 6"  (1.676 m), weight 48.4 kg, SpO2 99 %.  Past Medical History:  Diagnosis Date  . Allergy   . Anemia   . Anxiety   . Cataract   . Depression   . Diverticulitis   . Essential hypertension   . GERD (gastroesophageal reflux disease)   . Hyperlipidemia   . Hypothyroidism   . Recurrent UTI     Past Surgical History:  Procedure Laterality Date  . APPENDECTOMY    . EYE SURGERY    . HERNIA REPAIR    . TOTAL VAGINAL HYSTERECTOMY      Family History  Problem Relation Age of Onset  . Stroke Mother   . Miscarriages / [place] Mother   . Hyperlipidemia Mother   . Hypertension Mother   . Heart attack Father   . Hyperlipidemia Father   . Hypertension Father   . Hearing loss Father   . Miscarriages / Stillbirths Sister   . Heart attack Maternal Grandmother   . Heart attack Maternal Grandfather   . Heart attack Paternal Grandmother   . Heart attack Paternal Grandfather   . Diabetes Other   . Heart disease Other   . Kidney disease Other   . Heart attack Brother   . Cancer Sister     Social History:  reports that she has never smoked. She has never used smokeless tobacco. She reports that she does not drink alcohol or use drugs.  Allergies:  Allergies  Allergen  Reactions  . Sulfa Antibiotics Nausea Only, Anxiety and Rash    Medications: Prior to Admission medications   Medication Sig Start Date End Date Taking? Authorizing Provider  atorvastatin (LIPITOR) 20 MG tablet Take 1 tablet (20 mg total) by mouth daily at 6 PM. 11/04/17  Yes Dettinger, Fransisca Kaufmann, MD  diclofenac sodium (VOLTAREN) 1 % GEL Apply 2 g topically 4 (four) times daily. 06/12/18  Yes Dettinger, Fransisca Kaufmann, MD  divalproex (DEPAKOTE) 125 MG DR tablet Take 125 mg by mouth 3 (three) times daily.   Yes [provider]  ergocalciferol (VITAMIN D2) 50000 UNITS capsule Take 50,000 Units by mouth once a week. Mon   Yes [provider]  ferrous  gluconate (FERGON) 324 MG tablet Take 1 tablet (324 mg total) by mouth daily with breakfast. 08/18/18  Yes Dettinger, Fransisca Kaufmann, MD  fluticasone (FLONASE) 50 MCG/ACT nasal spray Place 2 sprays into both nostrils daily. Patient taking differently: Place 2 sprays into both nostrils daily as needed for allergies.  08/02/17  Yes Timmothy Euler, MD  potassium chloride SA (K-DUR) 20 MEQ tablet Take 1 tablet (20 mEq total) by mouth 2 (two) times daily. 07/24/18  Yes Idol, Almyra Free, PA-C  sertraline (ZOLOFT) 50 MG tablet Take 1 tablet (50 mg total) by mouth daily. 09/11/18  Yes Dettinger, Fransisca Kaufmann, MD  SYNTHROID 50 MCG tablet Take 1 tablet (50 mcg total) by mouth daily. 11/04/17  Yes Dettinger, Fransisca Kaufmann, MD  traZODone (DESYREL) 50 MG tablet  09/18/18  Yes [provider]  aspirin 81 MG tablet Take 1 tablet (81 mg total) by mouth daily. 03/19/18   Dettinger, Fransisca Kaufmann, MD  megestrol (MEGACE) 20 MG tablet Take 20 mg by mouth daily. 03/17/18   [provider]  Multiple Vitamins-Minerals (CENTRUM SILVER PO) Take 1 tablet by mouth daily.    [provider]    Scheduled Meds: . divalproex  125 mg Oral TID  . enoxaparin (LOVENOX) injection  30 mg Subcutaneous Q24H  . feeding supplement (ENSURE ENLIVE)  237 mL Oral BID BM  . levothyroxine  50 mcg Oral Q0600  . multivitamin with minerals  1 tablet Oral Daily   Continuous Infusions: . sodium chloride 100 mL/hr at 10/12/18 2057  . cefTRIAXone (ROCEPHIN)  IV 1 g (10/13/18 1537)   PRN Meds:.acetaminophen **OR** acetaminophen, labetalol, LORazepam, ondansetron **OR** ondansetron (ZOFRAN) IV, polyethylene glycol     Results for orders placed or performed during the hospital encounter of 10/12/18 (from the past 48 hour(s))  CBG monitoring, ED     Status: Abnormal   Collection Time: 10/12/18  2:17 PM  Result Value Ref Range   Glucose-Capillary 104 (H) 70 - 99 mg/dL  Urinalysis, Routine w reflex microscopic     Status: Abnormal   Collection  Time: 10/12/18  2:20 PM  Result Value Ref Range   Color, Urine YELLOW YELLOW   APPearance CLOUDY (A) CLEAR   Specific Gravity, Urine 1.014 1.005 - 1.030   pH 7.0 5.0 - 8.0   Glucose, UA NEGATIVE NEGATIVE mg/dL   Hgb urine dipstick SMALL (A) NEGATIVE   Bilirubin Urine NEGATIVE NEGATIVE   Ketones, ur 5 (A) NEGATIVE mg/dL   Protein, ur 30 (A) NEGATIVE mg/dL   Nitrite NEGATIVE NEGATIVE   Leukocytes,Ua MODERATE (A) NEGATIVE   RBC / HPF 6-10 0 - 5 RBC/hpf   WBC, UA >50 (H) 0 - 5 WBC/hpf   Bacteria, UA MANY (A) NONE SEEN   Squamous Epithelial / LPF 0-5  0 - 5   WBC Clumps PRESENT    Mucus PRESENT     Comment: Performed at [facility], [address], [place], [place] [address]  Protime-INR     Status: None   Collection Time: 10/12/18  2:28 PM  Result Value Ref Range   Prothrombin Time 13.9 11.4 - 15.2 seconds   INR 1.1 0.8 - 1.2    Comment: (NOTE) INR goal varies based on device and disease states. Performed at [facility], [address] [place], [place] [address]   APTT     Status: None   Collection Time: 10/12/18  2:28 PM  Result Value Ref Range   aPTT 27 24 - 36 seconds    Comment: Performed at [facility], [address] [place], [place] [address]  CBC     Status: Abnormal   Collection Time: 10/12/18  2:28 PM  Result Value Ref Range   WBC 10.2 4.0 - 10.5 K/uL   RBC 3.95 3.87 - 5.11 MIL/uL   Hemoglobin 11.2 (L) 12.0 - 15.0 g/dL   HCT 36.6 36.0 - 46.0 %   MCV 92.7 80.0 - 100.0 fL   MCH 28.4 26.0 - 34.0 pg   MCHC 30.6 30.0 - 36.0 g/dL   RDW 14.4 11.5 - 15.5 %   Platelets 279 150 - 400 K/uL   nRBC 0.0 0.0 - 0.2 %    Comment: Performed at [facility], [address], [place], [place] [address]  Differential     Status: Abnormal   Collection Time: 10/12/18  2:28 PM  Result Value Ref Range   Neutrophils Relative % 81 %   Neutro Abs 8.4 (H) 1.7 - 7.7 K/uL   Lymphocytes Relative 9 %   Lymphs Abs 0.9 0.7 - 4.0 K/uL   Monocytes Relative 7 %   Monocytes Absolute 0.7 0.1 -  1.0 K/uL   Eosinophils Relative 1 %   Eosinophils Absolute 0.1 0.0 - 0.5 K/uL   Basophils Relative 1 %   Basophils Absolute 0.1 0.0 - 0.1 K/uL   Immature Granulocytes 1 %   Abs Immature Granulocytes 0.05 0.00 - 0.07 K/uL    Comment: Performed at [facility], [address] [place], [place] [address]  Comprehensive metabolic panel     Status: Abnormal   Collection Time: 10/12/18  2:28 PM  Result Value Ref Range   Sodium 143 135 - 145 mmol/L   Potassium 4.2 3.5 - 5.1 mmol/L   Chloride 110 98 - 111 mmol/L   CO2 23 22 - 32 mmol/L   Glucose, Bld 105 (H) 70 - 99 mg/dL   BUN 39 (H) 8 - 23 mg/dL   Creatinine, Ser 1.63 (H) 0.44 - 1.00 mg/dL   Calcium 9.4 8.9 - 10.3 mg/dL   Total Protein 7.0 6.5 - 8.1 g/dL   Albumin 3.7 3.5 - 5.0 g/dL   AST 21 15 - 41 U/L   ALT 14 0 - 44 U/L   Alkaline Phosphatase 64 38 - 126 U/L   Total Bilirubin 1.0 0.3 - 1.2 mg/dL   GFR calc non Af Amer 27 (L) >60 mL/min   GFR calc Af Amer 31 (L) >60 mL/min   Anion gap 10 5 - 15    Comment: Performed at [facility], [address], [place], [place] [address]  I-stat chem 8, ED     Status: Abnormal   Collection Time: 10/12/18  2:41 PM  Result Value Ref Range   Sodium 145 135 -  145 mmol/L   Potassium 4.2 3.5 - 5.1 mmol/L   Chloride 110 98 - 111 mmol/L   BUN 35 (H) 8 - 23 mg/dL   Creatinine, Ser 1.60 (H) 0.44 - 1.00 mg/dL   Glucose, Bld 97 70 - 99 mg/dL   Calcium, Ion 1.16 1.15 - 1.40 mmol/L   TCO2 23 22 - 32 mmol/L   Hemoglobin 11.9 (L) 12.0 - 15.0 g/dL   HCT 35.0 (L) 36.0 - 46.0 %  Magnesium     Status: None   Collection Time: 10/12/18  2:57 PM  Result Value Ref Range   Magnesium 2.3 1.7 - 2.4 mg/dL    Comment: Performed at [facility], [address] [place], [place] [address]  Phosphorus     Status: None   Collection Time: 10/12/18  2:57 PM  Result Value Ref Range   Phosphorus 4.3 2.5 - 4.6 mg/dL    Comment: Performed at [facility], [address] [place], [place] [address]  CK     Status: None    Collection Time: 10/12/18  2:57 PM  Result Value Ref Range   Total CK 50 38 - 234 U/L    Comment: Performed at [facility], [address], [place], [place] [address]  Valproic acid level     Status: Abnormal   Collection Time: 10/12/18  3:37 PM  Result Value Ref Range   Valproic Acid Lvl 29 (L) 50.0 - 100.0 ug/mL    Comment: Performed at [facility], [address] [place], [place] [address]  Ammonia     Status: None   Collection Time: 10/12/18  3:37 PM  Result Value Ref Range   Ammonia 11 9 - 35 umol/L    Comment: Performed at [facility], [address] [place], [place] [address]  SARS Coronavirus 2 [facility] order, Performed in [facility] hospital lab) Nasopharyngeal Nasopharyngeal Swab     Status: None   Collection Time: 10/12/18  4:26 PM   Specimen: Nasopharyngeal Swab  Result Value Ref Range   SARS Coronavirus 2 NEGATIVE NEGATIVE    Comment: (NOTE) If result is NEGATIVE SARS-CoV-2 target nucleic acids are NOT DETECTED. The SARS-CoV-2 RNA is generally detectable in upper and lower  respiratory specimens during the acute phase of infection. The lowest  concentration of SARS-CoV-2 viral copies this assay can detect is 250  copies / mL. A negative result does not preclude SARS-CoV-2 infection  and should not be used as the sole basis for treatment or other  patient management decisions.  A negative result may occur with  improper specimen collection / handling, submission of specimen other  than nasopharyngeal swab, presence of viral mutation(s) within the  areas targeted by this assay, and inadequate number of viral copies  (<250 copies / mL). A negative result must be combined with clinical  observations, patient history, and epidemiological information. If result is POSITIVE SARS-CoV-2 target nucleic acids are DETECTED. The SARS-CoV-2 RNA is generally detectable in upper and lower  respiratory specimens dur ing the acute phase of infection.  Positive  results are  indicative of active infection with SARS-CoV-2.  Clinical  correlation with patient history and other diagnostic information is  necessary to determine patient infection status.  Positive results do  not rule out bacterial infection or co-infection with other viruses. If result is PRESUMPTIVE POSTIVE SARS-CoV-2 nucleic acids MAY BE PRESENT.   A presumptive positive result was obtained on the submitted specimen  and confirmed on repeat testing.  While 2019  novel coronavirus  (SARS-CoV-2) nucleic acids may be present in the submitted sample  additional confirmatory testing may be necessary for epidemiological  and / or clinical management purposes  to differentiate between  SARS-CoV-2 and other Sarbecovirus currently known to infect humans.  If clinically indicated additional testing with an alternate test  methodology [phone]) is advised. The SARS-CoV-2 RNA is generally  detectable in upper and lower respiratory sp ecimens during the acute  phase of infection. The expected result is Negative. Fact Sheet for Patients:  StrictlyIdeas.no Fact Sheet for Healthcare Providers: BankingDealers.co.za This test is not yet approved or cleared by the [place] FDA and has been authorized for detection and/or diagnosis of SARS-CoV-2 by FDA under an Emergency Use Authorization (EUA).  This EUA will remain in effect (meaning this test can be used) for the duration of the COVID-19 declaration under Section 564(b)(1) of the Act, 21 U.S.C. section 360bbb-3(b)(1), unless the authorization is terminated or revoked sooner. Performed at [facility] [address], [place], [place] [address]   TSH     Status: Abnormal   Collection Time: 10/12/18  5:27 PM  Result Value Ref Range   TSH 5.368 (H) 0.350 - 4.500 uIU/mL    Comment: Performed by a 3rd Generation assay with a functional sensitivity of <=0.01 uIU/mL. Performed at [facility], [address], [place], [place] [id number]   Basic metabolic panel     Status: Abnormal   Collection Time: 10/13/18  6:06 AM  Result Value Ref Range   Sodium 145 135 - 145 mmol/L   Potassium 3.3 (L) 3.5 - 5.1 mmol/L    Comment: DELTA CHECK NOTED   Chloride 113 (H) 98 - 111 mmol/L   CO2 23 22 - 32 mmol/L   Glucose, Bld 83 70 - 99 mg/dL   BUN 33 (H) 8 - 23 mg/dL   Creatinine, Ser 1.09 (H) 0.44 - 1.00 mg/dL   Calcium 8.8 (L) 8.9 - 10.3 mg/dL   GFR calc non Af Amer 44 (L) >60 mL/min   GFR calc Af Amer 51 (L) >60 mL/min   Anion gap 9 5 - 15    Comment: Performed at [facility], [address] [place], [place] [address]  CBC     Status: Abnormal   Collection Time: 10/13/18  6:06 AM  Result Value Ref Range   WBC 8.1 4.0 - 10.5 K/uL   RBC 3.54 (L) 3.87 - 5.11 MIL/uL   Hemoglobin 9.9 (L) 12.0 - 15.0 g/dL   HCT 32.6 (L) 36.0 - 46.0 %   MCV 92.1 80.0 - 100.0 fL   MCH 28.0 26.0 - 34.0 pg   MCHC 30.4 30.0 - 36.0 g/dL   RDW 14.2 11.5 - 15.5 %   Platelets 254 150 - 400 K/uL   nRBC 0.0 0.0 - 0.2 %    Comment: Performed at [facility], [address] [place],  [address]    Studies/Results: HEAD CT FINDINGS: Brain: There is atrophy and chronic small vessel disease changes. No acute intracranial abnormality. Specifically, no hemorrhage, hydrocephalus, mass lesion, acute infarction, or significant intracranial injury.  Vascular: No hyperdense vessel or unexpected calcification.  Skull: No acute calvarial abnormality.  Sinuses/Orbits: Visualized paranasal sinuses and mastoids clear. Orbital soft tissues unremarkable.  Other: None  IMPRESSION: Atrophy, chronic microvascular disease.  No acute intracranial abnormality.    BRAIN MRI FINDINGS: Brain: There is atrophy and chronic small vessel disease changes. No acute intracranial abnormality. Specifically, no hemorrhage, hydrocephalus, mass lesion,  acute infarction, or significant intracranial injury.  Vascular: No hyperdense  vessel or unexpected calcification.  Skull: No acute calvarial abnormality.  Sinuses/Orbits: Visualized paranasal sinuses and mastoids clear. Orbital soft tissues unremarkable.  Other: None  IMPRESSION: Atrophy, chronic microvascular disease.  No acute intracranial abnormality.     The brain MRI is reviewed in person and shows atrophy and chronic white matter changes but nothing acute.          Jarah Pember A. Merlene Rivers, M.D.  Diplomate, Tax adviser of Psychiatry and Neurology ( Neurology). 10/13/2018, 6:02 PM

## 2018-10-13 NOTE — Plan of Care (Signed)
  Problem: Acute Rehab PT Goals(only PT should resolve) Goal: Pt will Roll Supine to Side Flowsheets (Taken 10/13/2018 1045) Pt will Roll Supine to Side:  with mod assist  with cues (comment type and amount) Note: Verbal and tactile Goal: Pt Will Go Supine/Side To Sit Flowsheets (Taken 10/13/2018 1045) Pt will go Supine/Side to Sit:  with moderate assist  with cues (comment type and amount) Note: Verbal and tactile cues Goal: Patient Will Perform Sitting Balance Flowsheets (Taken 10/13/2018 1045) Patient will perform sitting balance:  with supervision  with bilateral UE support Note: For 15 seconds   Problem: Acute Rehab PT Goals(only PT should resolve) Goal: Pt Will Transfer Bed To Chair/Chair To Bed Flowsheets (Taken 10/13/2018 1045) Pt will Transfer Bed to Chair/Chair to Bed:  with mod assist  with min assist  with cues (comment type and amount) Note: Verbal and tactile cues  10:47 AM, 10/13/18 Jerene Pitch, DPT Physical Therapy with [facility]  [phone] office

## 2018-10-13 NOTE — Progress Notes (Signed)
Initial Nutrition Assessment  DOCUMENTATION CODES:   Not applicable  INTERVENTION:  -Ensure Enlive po BID, each supplement provides 350 kcal and 20 grams of protein -Magic cup TID with meals, each supplement provides 290 kcal and 9 grams of protein -MVI daily   NUTRITION DIAGNOSIS:   Increased nutrient needs related to acute illness(UTI) as evidenced by estimated needs(50% meal completion).  GOAL:   Patient will meet greater than or equal to 90% of their needs  MONITOR:   PO intake, Supplement acceptance, Labs, Weight trends, I & O's, Skin  REASON FOR ASSESSMENT:   Malnutrition Screening Tool, Low Braden    ASSESSMENT:   85 year old female with medical history significant for HTN, CKD3, depression, anxiety, presenting to ED from [facility] facility for evaluation of reports of unresponsiveness. Per paramedics, pt had seizure-like activity in route to hospital where she had short-lived stiffness that resolved spontaneously.  Unable to see patient today; she was taken for MRI this morning and EEG this afternoon.   [id number]: metabolic encephalopathy; UTI and dehydration UA - moderate leukocytes; many bacteria  Per PT; patient confused, not alert or oriented to person/time/place requiring moderate to max assist with all bed mobilities, balance, and transfers.   Patient with 50% of 1 recorded meal.   Current wt 48.4 (106.5 lb) non-pitting LLE taken today Wt history reviewed - pt noted with 5.9 lb wt loss x 2 months (5.3%; insignificant for time frame)  6/23 51.1 kg 4/15 50.8 kg  NUTRITION - FOCUSED PHYSICAL EXAM: Unable to complete at this time   Diet Order:   Diet Order            Diet Heart Room service appropriate? Yes; Fluid consistency: Thin  Diet effective now              EDUCATION NEEDS:   No education needs have been identified at this time  Skin:  Skin Assessment: Reviewed RN Assessment  Last BM:  PTA  Height:   Ht Readings from Last 1  Encounters:  10/12/18 5\' 6"  (1.676 m)    Weight:   Wt Readings from Last 1 Encounters:  10/12/18 48.4 kg    Ideal Body Weight:  59.1 kg  BMI:  Body mass index is 17.22 kg/m.  Estimated Nutritional Needs:   Kcal:  1350-1550  Protein:  68-78  Fluid:  1.3L  Lajuan Lines, RD, LDN Office [phone] After Hours/Weekend Pager: [phone]

## 2018-10-13 NOTE — Progress Notes (Addendum)
PROGRESS NOTE    Deborah Rivers  [id number] DOB: Jun 24, 1924 DOA: 10/12/2018 PCP: Dettinger, Fransisca Kaufmann, MD    Brief Narrative:  85 year old female who is a resident of an assisted living facility, with a history of dementia, admitted to the hospital with acute encephalopathy.  She is found to have urinary tract infection and dehydration with acute kidney injury.  She was admitted for further treatment with IV fluids and antibiotics.   Assessment & Plan:   Active Problems:   Essential hypertension   Mixed hyperlipidemia   Dementia (HCC)   CKD (chronic kidney disease), stage III (HCC)   Hypothyroidism   Acute metabolic encephalopathy   AMS (altered mental status)   AKI (acute kidney injury) ([place])   1. Acute metabolic encephalopathy.  Likely related to dehydration and urinary tract infection.  Patient was reportedly unresponsive at her facility.  Today, she is sitting up in the chair awake, confused which may be near her baseline.  There was concern for possible seizure during transport to the hospital.  She has not had any repeated episodes.  MRI imaging of the brain was unremarkable as was EEG.  Neurology consulted. 2. UTI.  Currently on ceftriaxone.  Follow-up culture 3. Acute kidney injury on chronic kidney disease stage III.  Improving with IV hydration.  Continue current treatments 4. Hypothyroidism.  Continue on home dose of Synthroid.  TSH elevated at 5.  This will need to be repeated in 4 to 6 weeks. 5. Hyperlipidemia.  Continue statin 6. Hypertension.  Blood pressure remains elevated.  Will start on low-dose Norvasc.   DVT prophylaxis: Lovenox Code Status: Full code Family Communication: Discussed with daughter over the phone Disposition Plan: Return to assisted living when improved.  She needs continued stay to the hospital for further neurologic work-up, neurology evaluation, further IV fluids for dehydration and IV antibiotics pending urine culture  results.   Consultants:   Neurology  Procedures:   EEG  Antimicrobials:   Ceftriaxone 8/23 >This EEG recorded evidence of a generalized nonspecific cerebral dysfunction (encephalopathy).    Subjective: Patient is confused, does not realize she is in the hospital.  Sitting up in chair.  Objective: Vitals:   10/13/18 0030 10/13/18 0230 10/13/18 0426 10/13/18 1402  BP: (!) 181/79 (!) 188/82 (!) 156/68 (!) 155/74  Pulse: 75 77 79 82  Resp: 19 18 19 18   Temp: 98.2 F (36.8 C)  98.5 F (36.9 C) 98 F (36.7 C)  TempSrc: Oral   Oral  SpO2: 99% 98%  99%  Weight:      Height:        Intake/Output Summary (Last 24 hours) at 10/13/2018 1814 Last data filed at 10/13/2018 1813 Gross per 24 hour  Intake 300 ml  Output 200 ml  Net 100 ml   Filed Weights   10/12/18 1358  Weight: 48.4 kg    Examination:  General exam: Appears calm and comfortable  Respiratory system: Clear to auscultation. Respiratory effort normal. Cardiovascular system: S1 & S2 heard, RRR. No JVD, murmurs, rubs, gallops or clicks.  Gastrointestinal system: Abdomen is nondistended, soft and nontender. No organomegaly or masses felt. Normal bowel sounds heard. Central nervous system: Limited exam since patient does not follow directions, moving all 4 extremities spontaneously. Extremities: No edema bilaterally Skin: No rashes, lesions or ulcers Psychiatry: Pleasant, confused    Data Reviewed: I have personally reviewed following labs and imaging studies  CBC: Recent Labs  Lab 10/12/18 1428 10/12/18 1441 10/13/18 0606  WBC  10.2  --  8.1  NEUTROABS 8.4*  --   --   HGB 11.2* 11.9* 9.9*  HCT 36.6 35.0* 32.6*  MCV 92.7  --  92.1  PLT 279  --  0000000   Basic Metabolic Panel: Recent Labs  Lab 10/12/18 1428 10/12/18 1441 10/12/18 1457 10/13/18 0606  NA 143 145  --  145  K 4.2 4.2  --  3.3*  CL 110 110  --  113*  CO2 23  --   --  23  GLUCOSE 105* 97  --  83  BUN 39* 35*  --  33*  CREATININE  1.63* 1.60*  --  1.09*  CALCIUM 9.4  --   --  8.8*  MG  --   --  2.3  --   PHOS  --   --  4.3  --    GFR: Estimated Creatinine Clearance: 25.2 mL/min (A) (by C-G formula based on SCr of 1.09 mg/dL (H)). Liver Function Tests: Recent Labs  Lab 10/12/18 1428  AST 21  ALT 14  ALKPHOS 64  BILITOT 1.0  PROT 7.0  ALBUMIN 3.7   No results for input(s): LIPASE, AMYLASE in the last 168 hours. Recent Labs  Lab 10/12/18 1537  AMMONIA 11   Coagulation Profile: Recent Labs  Lab 10/12/18 1428  INR 1.1   Cardiac Enzymes: Recent Labs  Lab 10/12/18 1457  CKTOTAL 50   BNP (last 3 results) No results for input(s): PROBNP in the last 8760 hours. HbA1C: No results for input(s): HGBA1C in the last 72 hours. CBG: Recent Labs  Lab 10/12/18 1417  GLUCAP 104*   Lipid Profile: No results for input(s): CHOL, HDL, LDLCALC, TRIG, CHOLHDL, LDLDIRECT in the last 72 hours. Thyroid Function Tests: Recent Labs    10/12/18 1727  TSH 5.368*   Anemia Panel: No results for input(s): VITAMINB12, FOLATE, FERRITIN, TIBC, IRON, RETICCTPCT in the last 72 hours. Sepsis Labs: No results for input(s): PROCALCITON, LATICACIDVEN in the last 168 hours.  Recent Results (from the past 240 hour(s))  SARS Coronavirus 2 [facility] order, Performed in [facility] hospital lab) Nasopharyngeal Nasopharyngeal Swab     Status: None   Collection Time: 10/12/18  4:26 PM   Specimen: Nasopharyngeal Swab  Result Value Ref Range Status   SARS Coronavirus 2 NEGATIVE NEGATIVE Final    Comment: (NOTE) If result is NEGATIVE SARS-CoV-2 target nucleic acids are NOT DETECTED. The SARS-CoV-2 RNA is generally detectable in upper and lower  respiratory specimens during the acute phase of infection. The lowest  concentration of SARS-CoV-2 viral copies this assay can detect is 250  copies / mL. A negative result does not preclude SARS-CoV-2 infection  and should not be used as the sole basis for treatment or other  patient  management decisions.  A negative result may occur with  improper specimen collection / handling, submission of specimen other  than nasopharyngeal swab, presence of viral mutation(s) within the  areas targeted by this assay, and inadequate number of viral copies  (<250 copies / mL). A negative result must be combined with clinical  observations, patient history, and epidemiological information. If result is POSITIVE SARS-CoV-2 target nucleic acids are DETECTED. The SARS-CoV-2 RNA is generally detectable in upper and lower  respiratory specimens dur ing the acute phase of infection.  Positive  results are indicative of active infection with SARS-CoV-2.  Clinical  correlation with patient history and other diagnostic information is  necessary to determine patient infection status.  Positive  results do  not rule out bacterial infection or co-infection with other viruses. If result is PRESUMPTIVE POSTIVE SARS-CoV-2 nucleic acids MAY BE PRESENT.   A presumptive positive result was obtained on the submitted specimen  and confirmed on repeat testing.  While 2019 novel coronavirus  (SARS-CoV-2) nucleic acids may be present in the submitted sample  additional confirmatory testing may be necessary for epidemiological  and / or clinical management purposes  to differentiate between  SARS-CoV-2 and other Sarbecovirus currently known to infect humans.  If clinically indicated additional testing with an alternate test  methodology [phone]) is advised. The SARS-CoV-2 RNA is generally  detectable in upper and lower respiratory sp ecimens during the acute  phase of infection. The expected result is Negative. Fact Sheet for Patients:  StrictlyIdeas.no Fact Sheet for Healthcare Providers: BankingDealers.co.za This test is not yet approved or cleared by the [place] FDA and has been authorized for detection and/or diagnosis of SARS-CoV-2 by FDA under  an Emergency Use Authorization (EUA).  This EUA will remain in effect (meaning this test can be used) for the duration of the COVID-19 declaration under Section 564(b)(1) of the Act, 21 U.S.C. section 360bbb-3(b)(1), unless the authorization is terminated or revoked sooner. Performed at [facility], [address], [place],  [address]          Radiology Studies: Ct Head Wo Contrast  Result Date: 10/12/2018 CLINICAL DATA:  Altered level of consciousness EXAM: CT HEAD WITHOUT CONTRAST TECHNIQUE: Contiguous axial images were obtained from the base of the skull through the vertex without intravenous contrast. COMPARISON:  08/31/2018 FINDINGS: Brain: There is atrophy and chronic small vessel disease changes. No acute intracranial abnormality. Specifically, no hemorrhage, hydrocephalus, mass lesion, acute infarction, or significant intracranial injury. Vascular: No hyperdense vessel or unexpected calcification. Skull: No acute calvarial abnormality. Sinuses/Orbits: Visualized paranasal sinuses and mastoids clear. Orbital soft tissues unremarkable. Other: None IMPRESSION: Atrophy, chronic microvascular disease. No acute intracranial abnormality. Electronically Signed   By: Rolm Baptise M.D.   On: 10/12/2018 16:08   Mr Brain Wo Contrast  Result Date: 10/13/2018 CLINICAL DATA:  Altered mental status with possible seizure activity EXAM: MRI HEAD WITHOUT CONTRAST TECHNIQUE: Multiplanar, multiecho pulse sequences of the brain and surrounding structures were obtained without intravenous contrast. COMPARISON:  Head CT 07/28/2018 FINDINGS: Truncated study, only diffusion, T2, and sagittal T1 weighted imaging was acquired. The sequences are also significantly motion degraded. Advanced atrophy with ventriculomegaly similar to prior. CSF collection along the left parietal convexity also seen on prior and compatible with small chronic hygroma. No evident infarct, hemorrhage, or mass. Bilateral mastoid  opacification greater on the right with negative nasopharynx. IMPRESSION: 1. Truncated and significantly motion degraded study. 2. No acute finding. 3. Advanced brain atrophy with chronic small vessel ischemia. Electronically Signed   By: Monte Fantasia M.D.   On: 10/13/2018 10:27   Dg Chest Port 1 View  Result Date: 10/12/2018 CLINICAL DATA:  Altered mental status EXAM: PORTABLE CHEST 1 VIEW COMPARISON:  07/28/2018 FINDINGS: Heart and mediastinal contours are within normal limits. No focal opacities or effusions. No acute bony abnormality. Aortic atherosclerosis. IMPRESSION: No active disease. Electronically Signed   By: Rolm Baptise M.D.   On: 10/12/2018 17:47   Dg Shoulder Left  Result Date: 10/12/2018 CLINICAL DATA:  Fall, bruising and swelling on left shoulder EXAM: LEFT SHOULDER - 2+ VIEW COMPARISON:  None. FINDINGS: Degenerative changes in the [facility] joint with joint space narrowing and spurring. Glenohumeral joint is maintained. No  acute bony abnormality. Specifically, no fracture, subluxation, or dislocation. Soft tissues are intact. IMPRESSION: Degenerative changes in the left AC joint. No acute bony abnormality. Electronically Signed   By: Rolm Baptise M.D.   On: 10/12/2018 17:46        Scheduled Meds:  aspirin  81 mg Oral Daily   [START ON 10/14/2018] atorvastatin  20 mg Oral q1800   divalproex  125 mg Oral TID   enoxaparin (LOVENOX) injection  30 mg Subcutaneous Q24H   feeding supplement (ENSURE ENLIVE)  237 mL Oral BID BM   levothyroxine  50 mcg Oral Q0600   multivitamin with minerals  1 tablet Oral Daily   Continuous Infusions:  sodium chloride 100 mL/hr at 10/12/18 2057   cefTRIAXone (ROCEPHIN)  IV 1 g (10/13/18 1537)     LOS: 0 days    Time spent: 77mins    Kathie Dike, MD [facility]   If 7PM-7AM, please contact night-coverage [email or web address]  10/13/2018, 6:14 PM

## 2018-10-13 NOTE — TOC Initial Note (Signed)
Transition of Care [facility]) - Initial/Assessment Note    Patient Details  Name: MELIZA BIERCE MRN: [id number] Date of Birth: Feb 14, 1925  Transition of [facility]) CM/SW Contact:    Azelea Seguin, Chauncey Reading, RN Phone Number: 10/13/2018, 2:11 PM  Clinical Narrative:          Patient from [facility]. Evaluated by PT, patient needed mod/max assist, did not ambulate during assessment. Unable to reach anyone at [facility] to discuss patient's baseline. Call to daughter, Romie Minus, she reports patient can ambulate some but is unsteady and facility urges [facility].   Will await to see if patient progresses more towards her baseline and will follow up with [facility] to see if they can manage patient. CM did discuss with daughter options of returning to [facility] with home health or if patient does need SNF, she would go to SNF with her [facility] Medicare benefits and then return to [facility].   TOC member to follow up tomorrow.   Expected Discharge Plan: Assisted Living Barriers to Discharge: Continued Medical Work up    Expected Discharge Plan and Services Expected Discharge Plan: Assisted Living       Living arrangements for the past 2 months: [place] Expected Discharge Date: 10/19/18                                    Prior Living Arrangements/Services Living arrangements for the past 2 months: Assisted Living Facility                Current home services: DME(WC, RW)    Activities of Daily Living Home Assistive Devices/Equipment: None ADL Screening (condition at time of admission) Patient's cognitive ability adequate to safely complete daily activities?: No Is the patient deaf or have difficulty hearing?: No Does the patient have difficulty seeing, even when wearing glasses/contacts?: No Does the patient have difficulty concentrating, remembering, or making decisions?: Yes Patient able to express need for assistance with ADLs?:  No Does the patient have difficulty dressing or bathing?: Yes Independently performs ADLs?: No Communication: Independent Dressing (OT): Dependent Is this a change from baseline?: Pre-admission baseline Grooming: Dependent Is this a change from baseline?: Pre-admission baseline Feeding: Needs assistance Is this a change from baseline?: Pre-admission baseline Bathing: Dependent Is this a change from baseline?: Pre-admission baseline Toileting: Dependent Is this a change from baseline?: Pre-admission baseline In/Out Bed: Dependent Is this a change from baseline?: Pre-admission baseline Walks in Home: Dependent Is this a change from baseline?: Pre-admission baseline Does the patient have difficulty walking or climbing stairs?: Yes Weakness of Legs: None Weakness of Arms/Hands: None  Permission Sought/Granted                  Emotional Assessment              Admission diagnosis:  Seizure ([place]) [R56.9] Acute encephalopathy [G93.40] Acute kidney injury ([place]) [N17.9] Urinary tract infection without hematuria, site unspecified [N39.0] AMS (altered mental status) [R41.82] Patient Active Problem List   Diagnosis Date Noted  . AMS (altered mental status) 10/12/2018  . Hypokalemia 03/21/2018  . Leg cramp 08/10/2017  . Fatigue 08/10/2017  . Hypothyroidism   . Recurrent UTI   . GERD (gastroesophageal reflux disease)   . Depression   . Cataract   . Anxiety   . Abnormal CT scan, colon 12/16/2015  . Mood disorder ([place]) 11/20/2015  .  Dementia ([place]) 11/20/2015  . CKD (chronic kidney disease), stage III ([place]) 11/20/2015  . Aortic calcification ([place]) 02/26/2014  . Essential hypertension 02/26/2014  . Mixed hyperlipidemia 02/26/2014  . Anemia 07/20/2013   PCP:  Dettinger, Fransisca Kaufmann, MD Pharmacy:   [facility] [address] [place] [address] [place] [address] Phone: [phone] Fax: [phone]  [place], [place] [address] [place] [address] Phone: [phone] Fax: [phone]     Social Determinants of Health (SDOH) Interventions    Readmission Risk Interventions No flowsheet data found.

## 2018-10-13 NOTE — Care Management Obs Status (Signed)
[place] NOTIFICATION   Patient Details  Name: Deborah Rivers MRN: [id number] Date of Birth: 03-11-1924   Medicare Observation Status Notification Given:       Tommy Medal 10/13/2018, 2:03 PM

## 2018-10-13 NOTE — Evaluation (Signed)
Physical Therapy Evaluation Patient Details Name: Deborah Rivers MRN: [id number] DOB: 02-Jan-1925 Today's Date: 10/13/2018   History of Present Illness  This patient is a 85 year old female, she is ill-appearing today as she presents with altered mental status.  It is unclear exactly what happened however the patient was last seen earlier this morning sitting in a wheelchair in her room and was later found unresponsive.  The patient is unable to give me any information, she mumbles incoherently.  She does appear to be somnolent but arousable to voice.  Based on the medical record medication chart that accompanies the patient she does take Depakote this is thought to be a mood stabilizer and not for seizures.  She also takes iron, levothyroxine, mag gastral, sertraline, Ativan as needed, trazodone for sleep and milk of magnesia and Maalox for constipation.  The patient does use a diclofenac topical gel for aches and pains.    Clinical Impression  Patient confused and not alert and oriented to time/place/person. Per recent hospital notes, patient has improved in responsiveness since admittance. Able to inconsistently follow 1 step commands, with frequent verbal and tactile cues. Moderate to max assist with all bed mobilities, balance and transfers. Unable to assess prior level of function secondary to recent AMS and patient poor historian. Patient currently requires 24 hours supervision and mod/max assist for functional mobility and would benefit from continued skilled physical therapy while in hospital and recommended venue below to increase strength, balance, endurance for safe ADLs and gait.   Follow Up Recommendations SNF;Supervision/Assistance - 24 hour;Supervision for mobility/OOB    Equipment Recommendations       Recommendations for Other Services       Precautions / Restrictions Precautions Precautions: Fall Restrictions Weight Bearing Restrictions: No      Mobility  Bed  Mobility Overal bed mobility: Needs Assistance Bed Mobility: Rolling;Sidelying to Sit;Supine to Sit Rolling: Mod assist;Max assist Sidelying to sit: Mod assist;Max assist Supine to sit: Mod assist;Max assist     General bed mobility comments: verbal cues throughout  Transfers Overall transfer level: Needs assistance   Transfers: Stand Pivot Transfers;Lateral/Scoot Transfers   Stand pivot transfers: Mod assist      Lateral/Scoot Transfers: Mod assist General transfer comment: verbal and tactile cues  Ambulation/Gait Ambulation/Gait assistance: (unable)              Stairs            Wheelchair Mobility    Modified Rankin (Stroke Patients Only)       Balance Overall balance assessment: Needs assistance Sitting-balance support: Feet supported;Bilateral upper extremity supported Sitting balance-Leahy Scale: Poor   Postural control: Posterior lean Standing balance support: Bilateral upper extremity supported Standing balance-Leahy Scale: Zero Standing balance comment: unable to stand up, posterior lean                             Pertinent Vitals/Pain Pain Assessment: No/denies pain    Home Living Family/patient expects to be discharged to:: Skilled nursing facility                      Prior Function Level of Independence: Needs assistance(unknown specific assist needed due to AMS)               Hand Dominance        Extremity/Trunk Assessment   Upper Extremity Assessment Upper Extremity Assessment: Generalized weakness;Difficult to assess due to impaired cognition  Lower Extremity Assessment Lower Extremity Assessment: Generalized weakness;Difficult to assess due to impaired cognition    Cervical / Trunk Assessment Cervical / Trunk Assessment: Kyphotic  Communication   Communication: Receptive difficulties;Expressive difficulties  Cognition Arousal/Alertness: Lethargic;Awake/alert Behavior During Therapy:  Restless Overall Cognitive Status: Impaired/Different from baseline(baseline compared to chart review) Area of Impairment: Orientation;Following commands;Safety/judgement;Problem solving;Attention;Memory                 Orientation Level: Disoriented to;Person;Place;Time;Situation Current Attention Level: Selective Memory: Decreased short-term memory(thought PT was "abby") Following Commands: Follows one step commands inconsistently;Follows one step commands with increased time Safety/Judgement: Decreased awareness of safety   Problem Solving: Slow processing;Difficulty sequencing;Requires verbal cues;Requires tactile cues General Comments: easily confused      General Comments      Exercises     Assessment/Plan    PT Assessment Patient needs continued PT services  PT Problem List Decreased strength;Decreased safety awareness;Decreased mobility;Decreased range of motion;Decreased activity tolerance;Decreased balance;Decreased cognition;Decreased knowledge of use of DME;Decreased knowledge of precautions       PT Treatment Interventions Functional mobility training;Therapeutic activities;Balance training;Therapeutic exercise;Gait training    PT Goals (Current goals can be found in the Care Plan section)  Acute Rehab PT Goals PT Goal Formulation: Patient unable to participate in goal setting(AMS)    Frequency Min 3X/week   Barriers to discharge   unable to assess due to AMS    Co-evaluation               AM-PAC PT "6 Clicks" Mobility  Outcome Measure Help needed turning from your back to your side while in a flat bed without using bedrails?: A Lot Help needed moving from lying on your back to sitting on the side of a flat bed without using bedrails?: A Lot Help needed moving to and from a bed to a chair (including a wheelchair)?: A Lot Help needed standing up from a chair using your arms (e.g., wheelchair or bedside chair)?: A Lot Help needed to walk in  hospital room?: Total Help needed climbing 3-5 steps with a railing? : Total 6 Click Score: 10    End of Session Equipment Utilized During Treatment: Gait belt Activity Tolerance: Patient limited by fatigue;Other (comment)(IV pulled out, confusion/AMS) Patient left: in chair;with call bell/phone within reach;with chair alarm set Nurse Communication: Mobility status;Precautions PT Visit Diagnosis: Unsteadiness on feet (R26.81);Muscle weakness (generalized) (M62.81);Difficulty in walking, not elsewhere classified (R26.2);Other abnormalities of gait and mobility (R26.89)    Time: [id number] PT Time Calculation (min) (ACUTE ONLY): 28 min   Charges:   PT Evaluation $PT Eval Moderate Complexity: 1 Mod PT Treatments $Therapeutic Activity: 23-37 mins      10:53 AM, 10/13/18 Jerene Pitch, DPT Physical Therapy with [facility]  [phone] office

## 2018-10-13 NOTE — Care Management Obs Status (Signed)
[place] NOTIFICATION   Patient Details  Name: Deborah Rivers MRN: [id number] Date of Birth: Nov 04, 1924   Medicare Observation Status Notification Given:  Yes    Tommy Medal 10/13/2018, 2:05 PM

## 2018-10-13 NOTE — Procedures (Signed)
History: 85 year old female being evaluated for encephalopathy.  Also with questionable history of seizure by paramedics.  Sedation: None  Technique: This is a 21 channel routine scalp EEG performed at the bedside with bipolar and monopolar montages arranged in accordance to the international 10/20 system of electrode placement. One channel was dedicated to EKG recording.    Background: There is a posterior dominant rhythm of 8 Hz which is moderately well sustained.  In addition, there is intrusion of the background of generalized irregular delta and theta range activity.  There are also occasional bifrontally predominant discharges with triphasic morphology.  With drowsiness there is anterior shifting of the posterior dominant rhythm, but sleep is not recorded.  There were no epileptiform discharges seen.  Photic stimulation: Physiologic driving is not performed  EEG Abnormalities: 1) triphasic waves 2) generalized irregular slow activity  Clinical Interpretation: This EEG recorded evidence of a generalized nonspecific cerebral dysfunction (encephalopathy).. Please note that lack of epileptiform activity on EEG does not preclude the possibility of epilepsy.   Roland Rack, MD [facility] [phone]  If 7pm- 7am, please page neurology on call as listed in [place].

## 2018-10-14 LAB — PROLACTIN: Prolactin: 29.9 ng/mL — ABNORMAL HIGH (ref 4.8–23.3)

## 2018-10-14 LAB — BASIC METABOLIC PANEL
Anion gap: 8 (ref 5–15)
BUN: 24 mg/dL — ABNORMAL HIGH (ref 8–23)
CO2: 22 mmol/L (ref 22–32)
Calcium: 8.7 mg/dL — ABNORMAL LOW (ref 8.9–10.3)
Chloride: 107 mmol/L (ref 98–111)
Creatinine, Ser: 0.89 mg/dL (ref 0.44–1.00)
GFR calc Af Amer: >60 mL/min (ref >60)
GFR calc non Af Amer: 56 mL/min — ABNORMAL LOW (ref >60)
Glucose, Bld: 84 mg/dL (ref 70–99)
Potassium: 3.1 mmol/L — ABNORMAL LOW (ref 3.5–5.1)
Sodium: 137 mmol/L (ref 135–145)

## 2018-10-14 MED ORDER — POTASSIUM CHLORIDE 10 MEQ/100ML IV SOLN: 10.0000 meq | INTRAVENOUS | Status: DC

## 2018-10-14 MED ORDER — POTASSIUM CHLORIDE CRYS ER 20 MEQ PO TBCR
40.0000 meq | EXTENDED_RELEASE_TABLET | Freq: Once | ORAL | Status: AC
  Administered 2018-10-14: 40 meq via ORAL
  Filled 2018-10-14: qty 2

## 2018-10-14 NOTE — Progress Notes (Signed)
PROGRESS NOTE    Deborah Rivers  [id number] DOB: 20-Jan-1925 DOA: 10/12/2018 PCP: Dettinger, Fransisca Kaufmann, MD    Brief Narrative:  85 year old female who is a resident of an assisted living facility, with a history of dementia, admitted to the hospital with acute encephalopathy.  She is found to have urinary tract infection and dehydration with acute kidney injury.  She was admitted for further treatment with IV fluids and antibiotics.   Assessment & Plan:   Active Problems:   Essential hypertension   Mixed hyperlipidemia   Dementia (HCC)   CKD (chronic kidney disease), stage III (HCC)   Hypothyroidism   Acute metabolic encephalopathy   Acute lower UTI   AMS (altered mental status)   AKI (acute kidney injury) ([place])   Acute encephalopathy   1. Acute metabolic encephalopathy.  Likely related to dehydration and urinary tract infection.  Patient was reportedly unresponsive at her facility.  Today, she is sitting up in the chair awake, confused which may be near her baseline.  There was concern for possible seizure during transport to the hospital.  She has not had any repeated episodes.  MRI imaging of the brain was unremarkable as was EEG.  Neurology consulted. 2. UTI.  Urine culture positive for Klebsiella.  Currently on ceftriaxone.  Follow-up sensitivities. 3. Acute kidney injury on chronic kidney disease stage III.  Improving with IV hydration.  Continue current treatments 4. Hypothyroidism.  Continue on home dose of Synthroid.  TSH elevated at 5.  This will need to be repeated in 4 to 6 weeks. 5. Hyperlipidemia.  Continue statin 6. Hypertension.  Blood pressure stable on Norvasc 7. Hypokalemia.  Replace.  Check magnesium.   DVT prophylaxis: Lovenox Code Status: Full code Family Communication: Discussed with daughter over the phone 8/24 Disposition Plan: Return to assisted living when improved, likely in a.m.  She needs continued stay to the hospital for further neurologic  work-up, neurology evaluation, further IV fluids for dehydration and IV antibiotics pending urine culture results.   Consultants:   Neurology  Procedures:   [id number] EEG recorded evidence of a generalized nonspecific cerebral dysfunction (encephalopathy)  Antimicrobials:   Ceftriaxone 8/23 >This EEG recorded evidence of a generalized nonspecific cerebral dysfunction (encephalopathy).    Subjective: Patient is sitting up in chair.  She is confused.  She resists being examined.  Objective: Vitals:   10/13/18 1402 10/13/18 2155 10/14/18 0636 10/14/18 1350  BP: (!) 155/74 (!) 199/97 (!) 157/87 (!) 147/89  Pulse: 82 81 77 81  Resp: 18 20 20 20   Temp: 98 F (36.7 C) 98.2 F (36.8 C) 98.1 F (36.7 C) 98.2 F (36.8 C)  TempSrc: Oral Oral Oral Axillary  SpO2: 99% 99% 98% 96%  Weight:      Height:        Intake/Output Summary (Last 24 hours) at 10/14/2018 1812 Last data filed at 10/14/2018 1737 Gross per 24 hour  Intake 2411.01 ml  Output 1500 ml  Net 911.01 ml   Filed Weights   10/12/18 1358  Weight: 48.4 kg    Examination:  General exam: Alert, awake, no distress Respiratory system: Clear to auscultation. Respiratory effort normal. Cardiovascular system:RRR. No murmurs, rubs, gallops. Gastrointestinal system: Abdomen is nondistended, soft and nontender. No organomegaly or masses felt. Normal bowel sounds heard. Central nervous system: No focal neurological deficits. Extremities: No C/C/E, +pedal pulses Skin: No rashes, lesions or ulcers Psychiatry: confused, agitated.     Data Reviewed: I have personally reviewed following labs and  imaging studies  CBC: Recent Labs  Lab 10/12/18 1428 10/12/18 1441 10/13/18 0606  WBC 10.2  --  8.1  NEUTROABS 8.4*  --   --   HGB 11.2* 11.9* 9.9*  HCT 36.6 35.0* 32.6*  MCV 92.7  --  92.1  PLT 279  --  0000000   Basic Metabolic Panel: Recent Labs  Lab 10/12/18 1428 10/12/18 1441 10/12/18 1457 10/13/18 0606 10/14/18  0507  NA 143 145  --  145 137  K 4.2 4.2  --  3.3* 3.1*  CL 110 110  --  113* 107  CO2 23  --   --  23 22  GLUCOSE 105* 97  --  83 84  BUN 39* 35*  --  33* 24*  CREATININE 1.63* 1.60*  --  1.09* 0.89  CALCIUM 9.4  --   --  8.8* 8.7*  MG  --   --  2.3  --   --   PHOS  --   --  4.3  --   --    GFR: Estimated Creatinine Clearance: 30.8 mL/min (by C-G formula based on SCr of 0.89 mg/dL). Liver Function Tests: Recent Labs  Lab 10/12/18 1428  AST 21  ALT 14  ALKPHOS 64  BILITOT 1.0  PROT 7.0  ALBUMIN 3.7   No results for input(s): LIPASE, AMYLASE in the last 168 hours. Recent Labs  Lab 10/12/18 1537  AMMONIA 11   Coagulation Profile: Recent Labs  Lab 10/12/18 1428  INR 1.1   Cardiac Enzymes: Recent Labs  Lab 10/12/18 1457  CKTOTAL 50   BNP (last 3 results) No results for input(s): PROBNP in the last 8760 hours. HbA1C: No results for input(s): HGBA1C in the last 72 hours. CBG: Recent Labs  Lab 10/12/18 1417  GLUCAP 104*   Lipid Profile: No results for input(s): CHOL, HDL, LDLCALC, TRIG, CHOLHDL, LDLDIRECT in the last 72 hours. Thyroid Function Tests: Recent Labs    10/12/18 1727  TSH 5.368*   Anemia Panel: No results for input(s): VITAMINB12, FOLATE, FERRITIN, TIBC, IRON, RETICCTPCT in the last 72 hours. Sepsis Labs: No results for input(s): PROCALCITON, LATICACIDVEN in the last 168 hours.  Recent Results (from the past 240 hour(s))  Urine Culture     Status: Abnormal (Preliminary result)   Collection Time: 10/12/18  2:20 PM   Specimen: Urine, Clean Catch  Result Value Ref Range Status   Specimen Description   Final    URINE, CLEAN CATCH Performed at [facility], [address], [place], [place] [address]    Special Requests   Final    NONE Performed at [facility], [address] [place], [place] [address]    Culture (A)  Final    >=100,000 COLONIES/mL KLEBSIELLA PNEUMONIAE CULTURE REINCUBATED FOR BETTER GROWTH SUSCEPTIBILITIES TO FOLLOW  Performed at [facility], [place] [address], [place], [place] [address]    Report Status PENDING  Incomplete  SARS Coronavirus 2 [facility] order, Performed in [facility] hospital lab) Nasopharyngeal Nasopharyngeal Swab     Status: None   Collection Time: 10/12/18  4:26 PM   Specimen: Nasopharyngeal Swab  Result Value Ref Range Status   SARS Coronavirus 2 NEGATIVE NEGATIVE Final    Comment: (NOTE) If result is NEGATIVE SARS-CoV-2 target nucleic acids are NOT DETECTED. The SARS-CoV-2 RNA is generally detectable in upper and lower  respiratory specimens during the acute phase of infection. The lowest  concentration of SARS-CoV-2 viral copies this assay can detect is 250  copies / mL. A negative result does not preclude SARS-CoV-2 infection  and should not be used as the sole basis for treatment or other  patient management decisions.  A negative result may occur with  improper specimen collection / handling, submission of specimen other  than nasopharyngeal swab, presence of viral mutation(s) within the  areas targeted by this assay, and inadequate number of viral copies  (<250 copies / mL). A negative result must be combined with clinical  observations, patient history, and epidemiological information. If result is POSITIVE SARS-CoV-2 target nucleic acids are DETECTED. The SARS-CoV-2 RNA is generally detectable in upper and lower  respiratory specimens dur ing the acute phase of infection.  Positive  results are indicative of active infection with SARS-CoV-2.  Clinical  correlation with patient history and other diagnostic information is  necessary to determine patient infection status.  Positive results do  not rule out bacterial infection or co-infection with other viruses. If result is PRESUMPTIVE POSTIVE SARS-CoV-2 nucleic acids MAY BE PRESENT.   A presumptive positive result was obtained on the submitted specimen  and confirmed on repeat testing.  While 2019 novel  coronavirus  (SARS-CoV-2) nucleic acids may be present in the submitted sample  additional confirmatory testing may be necessary for epidemiological  and / or clinical management purposes  to differentiate between  SARS-CoV-2 and other Sarbecovirus currently known to infect humans.  If clinically indicated additional testing with an alternate test  methodology [phone]) is advised. The SARS-CoV-2 RNA is generally  detectable in upper and lower respiratory sp ecimens during the acute  phase of infection. The expected result is Negative. Fact Sheet for Patients:  StrictlyIdeas.no Fact Sheet for Healthcare Providers: BankingDealers.co.za This test is not yet approved or cleared by the [place] FDA and has been authorized for detection and/or diagnosis of SARS-CoV-2 by FDA under an Emergency Use Authorization (EUA).  This EUA will remain in effect (meaning this test can be used) for the duration of the COVID-19 declaration under Section 564(b)(1) of the Act, 21 U.S.C. section 360bbb-3(b)(1), unless the authorization is terminated or revoked sooner. Performed at [facility], [address], [place], [place] [address]          Radiology Studies: Mr Brain 25 Contrast  Result Date: 10/13/2018 CLINICAL DATA:  Altered mental status with possible seizure activity EXAM: MRI HEAD WITHOUT CONTRAST TECHNIQUE: Multiplanar, multiecho pulse sequences of the brain and surrounding structures were obtained without intravenous contrast. COMPARISON:  Head CT 07/28/2018 FINDINGS: Truncated study, only diffusion, T2, and sagittal T1 weighted imaging was acquired. The sequences are also significantly motion degraded. Advanced atrophy with ventriculomegaly similar to prior. CSF collection along the left parietal convexity also seen on prior and compatible with small chronic hygroma. No evident infarct, hemorrhage, or mass. Bilateral mastoid opacification  greater on the right with negative nasopharynx. IMPRESSION: 1. Truncated and significantly motion degraded study. 2. No acute finding. 3. Advanced brain atrophy with chronic small vessel ischemia. Electronically Signed   By: Monte Fantasia M.D.   On: 10/13/2018 10:27        Scheduled Meds: . amLODipine  5 mg Oral Daily  . aspirin EC  81 mg Oral Daily  . atorvastatin  20 mg Oral q1800  . divalproex  125 mg Oral TID  . enoxaparin (LOVENOX) injection  30 mg Subcutaneous Q24H  . feeding supplement (ENSURE ENLIVE)  237 mL Oral BID BM  . levothyroxine  50 mcg Oral Q0600  . multivitamin with minerals  1 tablet Oral Daily  . potassium chloride  40 mEq Oral Once   Continuous Infusions: . 0.45 % NaCl with KCl 20 mEq / L 100 mL/hr at 10/14/18 1737  . cefTRIAXone (ROCEPHIN)  IV 1 g (10/14/18 1609)  . potassium chloride       LOS: 1 day    Time spent: 57mins    Kathie Dike, MD [facility]   If 7PM-7AM, please contact night-coverage [email or web address]  10/14/2018, 6:12 PM

## 2018-10-14 NOTE — TOC Progression Note (Signed)
Transition of Care [facility]) - Progression Note    Patient Details  Name: Deborah Rivers MRN: [id number] Date of Birth: 1924/10/26  Transition of Care [facility]) CM/SW Contact  Shade Flood, LCSW Phone Number: 10/14/2018, 2:28 PM  Clinical Narrative:     TOC following. Per MD, pt may be stable for dc tomorrow. Spoke with pt's daughter today about dc plans. She states that she has spoken with the director at [facility] and they discussed the idea of pt moving to the memory care unit at [facility]. This would increase the cost of pt's care. Daughter stating that pt cannot afford that and she is asking if hospital [facility] can help her apply for assistance.   LCSW followed up with the director at [facility] and was informed that she discussed with pt's daughter last night that pt won't be eligible for Medicaid due to her income. Director did explain to daughter that she could connect with the owner at [facility] to see if they could work out a [employer] that pt can afford. Otherwise daughter could look at a memory care unit at a NH where pt would eventually be able to qualify for Medicaid.  Director at [facility] says that if pt discharges before the memory care contract is worked out, she would have to dc back to her ALF room and transfer once the arrangements are worked out. Director to call daughter again to review above.   TOC will follow up tomorrow and assist with dc planning back to [facility] vs. SNF.  Expected Discharge Plan: Assisted Living Barriers to Discharge: Continued Medical Work up  Expected Discharge Plan and Services Expected Discharge Plan: Assisted Living       Living arrangements for the past 2 months: [place] Expected Discharge Date: 10/19/18                                     Social Determinants of Health (SDOH) Interventions    Readmission Risk Interventions No flowsheet data found.

## 2018-10-14 NOTE — Progress Notes (Signed)
Discussed with Dr. Roderic Palau this evening that patient's K+ level was 3.1 today. KCL 40 mEq PO dose showing on MAR for yesterday but not charted as given. Stated he would order KCL 40 mEq PO for this evening and KCL runs IV if unable to take PO dose, otherwise d/c KCL runs. Notified Dr. Roderic Palau that patient took PO dose with no trouble, IV KCL d/c'd. Donavan Foil, RN

## 2018-10-14 NOTE — Progress Notes (Signed)
Patients BP 179/90. Heart rate 80. PRN Labetalol given for systolic [id number].

## 2018-10-14 NOTE — Progress Notes (Signed)
Physical Therapy Treatment Patient Details Name: Deborah Rivers MRN: [id number] DOB: [birth year] Today's Date: 10/14/2018    History of Present Illness This patient is a 85 year old female, she is ill-appearing today as she presents with altered mental status.  It is unclear exactly what happened however the patient was last seen earlier this morning sitting in a wheelchair in her room and was later found unresponsive.  The patient is unable to give me any information, she mumbles incoherently.  She does appear to be somnolent but arousable to voice.  Based on the medical record medication chart that accompanies the patient she does take Depakote this is thought to be a mood stabilizer and not for seizures.  She also takes iron, levothyroxine, mag gastral, sertraline, Ativan as needed, trazodone for sleep and milk of magnesia and Maalox for constipation.  The patient does use a diclofenac topical gel for aches and pains.    PT Comments    Patient continues to present with AMS and unable to orient to time/place/location at this time. Moderate to maximum assist required with all bed mobilities and transfers. Unable to stand or walk during today's treatment session as patient was mildly resistive to transitional mobility. Left patient in chair with chair alarm on and call bell within reach, nursing notified.  Patient will continue to benefit from skilled physical therapy in hospital and recommended venue below to increase strength, balance, endurance for safe ADLs and gait.   Follow Up Recommendations  SNF;Supervision/Assistance - 24 hour;Supervision for mobility/OOB     Equipment Recommendations       Recommendations for Other Services       Precautions / Restrictions Precautions Precautions: Fall Restrictions Weight Bearing Restrictions: No    Mobility  Bed Mobility Overal bed mobility: Needs Assistance Bed Mobility: Rolling;Sidelying to Sit;Supine to Sit Rolling: Mod assist;Max  assist Sidelying to sit: Mod assist;Max assist Supine to sit: Mod assist;Max assist     General bed mobility comments: verbal cues throughout  Transfers Overall transfer level: Needs assistance   Transfers: Stand Pivot Transfers;Lateral/Scoot Transfers   Stand pivot transfers: Mod assist;Max assist      Lateral/Scoot Transfers: Mod assist;Max assist General transfer comment: verbal and tactile cues  Ambulation/Gait                 Stairs             Wheelchair Mobility    Modified Rankin (Stroke Patients Only)       Balance Overall balance assessment: Needs assistance Sitting-balance support: Feet supported;Bilateral upper extremity supported Sitting balance-Leahy Scale: Poor   Postural control: Posterior lean                                  Cognition Arousal/Alertness: Lethargic;Awake/alert Behavior During Therapy: Restless Overall Cognitive Status: Impaired/Different from baseline(baseline compared to chart review) Area of Impairment: Orientation;Following commands;Safety/judgement;Problem solving;Attention;Memory                 Orientation Level: Disoriented to;Person;Place;Time;Situation Current Attention Level: Selective Memory: Decreased short-term memory(thought PT was "abby") Following Commands: Follows one step commands inconsistently;Follows one step commands with increased time Safety/Judgement: Decreased awareness of safety   Problem Solving: Slow processing;Difficulty sequencing;Requires verbal cues;Requires tactile cues General Comments: easily confused      Exercises      General Comments        Pertinent Vitals/Pain Pain Assessment: No/denies pain    Home Living  Prior Function            PT Goals (current goals can now be found in the care plan section) Acute Rehab PT Goals PT Goal Formulation: Patient unable to participate in goal setting    Frequency    Min  3X/week      PT Plan Current plan remains appropriate    Co-evaluation              AM-PAC PT "6 Clicks" Mobility   Outcome Measure  Help needed turning from your back to your side while in a flat bed without using bedrails?: A Lot Help needed moving from lying on your back to sitting on the side of a flat bed without using bedrails?: A Lot Help needed moving to and from a bed to a chair (including a wheelchair)?: A Lot Help needed standing up from a chair using your arms (e.g., wheelchair or bedside chair)?: A Lot Help needed to walk in hospital room?: Total Help needed climbing 3-5 steps with a railing? : Total 6 Click Score: 10    End of Session Equipment Utilized During Treatment: Gait belt Activity Tolerance: Patient limited by fatigue Patient left: in chair;with call bell/phone within reach;with chair alarm set Nurse Communication: Mobility status PT Visit Diagnosis: Unsteadiness on feet (R26.81);Muscle weakness (generalized) (M62.81);Difficulty in walking, not elsewhere classified (R26.2);Other abnormalities of gait and mobility (R26.89)     Time: [id number] PT Time Calculation (min) (ACUTE ONLY): 17 min  Charges:  $Therapeutic Activity: 8-22 mins                     10:26 AM, 10/14/18 Jerene Pitch, DPT Physical Therapy with [facility]  [phone] office

## 2018-10-15 ENCOUNTER — Ambulatory Visit: Payer: Medicare Other | Admitting: Family Medicine

## 2018-10-15 LAB — URINE CULTURE: Culture: >=100000 — AB

## 2018-10-15 LAB — BASIC METABOLIC PANEL
Anion gap: 11 (ref 5–15)
BUN: 24 mg/dL — ABNORMAL HIGH (ref 8–23)
CO2: 17 mmol/L — ABNORMAL LOW (ref 22–32)
Calcium: 8.5 mg/dL — ABNORMAL LOW (ref 8.9–10.3)
Chloride: 109 mmol/L (ref 98–111)
Creatinine, Ser: 1 mg/dL (ref 0.44–1.00)
GFR calc Af Amer: 57 mL/min — ABNORMAL LOW (ref >60)
GFR calc non Af Amer: 49 mL/min — ABNORMAL LOW (ref >60)
Glucose, Bld: 82 mg/dL (ref 70–99)
Potassium: 4.3 mmol/L (ref 3.5–5.1)
Sodium: 137 mmol/L (ref 135–145)

## 2018-10-15 LAB — MAGNESIUM: Magnesium: 2.1 mg/dL (ref 1.7–2.4)

## 2018-10-15 MED ORDER — ACETAMINOPHEN 325 MG PO TABS: 650.0000 mg | ORAL_TABLET | Freq: Four times a day (QID) | ORAL | 0 refills | Status: AC | PRN

## 2018-10-15 MED ORDER — MEGESTROL ACETATE 400 MG/10ML PO SUSP: 400.0000 mg | Freq: Every day | ORAL | 0 refills | Status: DC

## 2018-10-15 MED ORDER — DIVALPROEX SODIUM 125 MG PO DR TAB
125.0000 mg | DELAYED_RELEASE_TABLET | Freq: Every day | ORAL | Status: DC
  Filled 2018-10-15 (×4): qty 1

## 2018-10-15 MED ORDER — AMLODIPINE BESYLATE 5 MG PO TABS: 5.0000 mg | ORAL_TABLET | Freq: Every day | ORAL | 1 refills | Status: DC

## 2018-10-15 MED ORDER — CEPHALEXIN 500 MG PO CAPS: 500.0000 mg | ORAL_CAPSULE | Freq: Three times a day (TID) | ORAL | 0 refills | Status: DC

## 2018-10-15 MED ORDER — SENNOSIDES-DOCUSATE SODIUM 8.6-50 MG PO TABS: 2.0000 | ORAL_TABLET | Freq: Every day | ORAL | 1 refills | Status: AC

## 2018-10-15 MED ORDER — ONDANSETRON HCL 4 MG PO TABS: 4.0000 mg | ORAL_TABLET | Freq: Four times a day (QID) | ORAL | 0 refills | Status: AC | PRN

## 2018-10-15 MED ORDER — CEPHALEXIN 250 MG PO CAPS
250.0000 mg | ORAL_CAPSULE | Freq: Three times a day (TID) | ORAL | Status: AC
  Administered 2018-10-16 – 2018-10-17 (×6): 250 mg via ORAL
  Filled 2018-10-15 (×6): qty 1

## 2018-10-15 MED ORDER — DIVALPROEX SODIUM 125 MG PO DR TAB: 125.0000 mg | DELAYED_RELEASE_TABLET | ORAL | 2 refills | Status: DC

## 2018-10-15 MED ORDER — SODIUM CHLORIDE 0.9 % IV SOLN
1.0000 g | INTRAVENOUS | Status: DC
  Administered 2018-10-15: 1 g via INTRAVENOUS
  Filled 2018-10-15: qty 10

## 2018-10-15 MED ORDER — TRAZODONE HCL 50 MG PO TABS: 50.0000 mg | ORAL_TABLET | Freq: Every evening | ORAL | 0 refills | Status: DC | PRN

## 2018-10-15 MED ORDER — ASPIRIN 81 MG PO TABS: 81.0000 mg | ORAL_TABLET | Freq: Every day | ORAL | 1 refills | Status: DC

## 2018-10-15 MED ORDER — HALOPERIDOL LACTATE 5 MG/ML IJ SOLN
2.0000 mg | Freq: Once | INTRAMUSCULAR | Status: AC
  Administered 2018-10-15: 2 mg via INTRAVENOUS
  Filled 2018-10-15: qty 1

## 2018-10-15 NOTE — NC FL2 (Addendum)
[place] LEVEL OF CARE SCREENING TOOL     IDENTIFICATION  Patient Name: Deborah Rivers Birthdate: [birth year] Sex: female Admission Date (Current Location): 10/12/2018  [facility] and [place] Number:  [employer] and Address:  [place] [address] [place]      Provider Number: [id number]  Attending Physician Name and Address:  Roxan Hockey, MD  Relative Name and Phone Number:  Romie Minus - daughter  [phone]    Current Level of Care: Hospital Recommended Level of Care: [place] Prior Approval Number:    Date Approved/Denied:   PASRR Number:   [id number]  Discharge Plan: SNF    Current Diagnoses: Patient Active Problem List   Diagnosis Date Noted  . AKI (acute kidney injury) ([place]) 10/13/2018  . Acute encephalopathy 10/13/2018  . AMS (altered mental status) 10/12/2018  . Acute metabolic encephalopathy [id number]  . Acute lower UTI 03/21/2018  . Hypokalemia 03/21/2018  . Leg cramp 08/10/2017  . Fatigue 08/10/2017  . Hypothyroidism   . Recurrent UTI   . GERD (gastroesophageal reflux disease)   . Depression   . Cataract   . Anxiety   . Abnormal CT scan, colon 12/16/2015  . Mood disorder ([place]) 11/20/2015  . Dementia ([place]) 11/20/2015  . CKD (chronic kidney disease), stage III ([place]) 11/20/2015  . Aortic calcification ([place]) 02/26/2014  . Essential hypertension 02/26/2014  . Mixed hyperlipidemia 02/26/2014  . Anemia 07/20/2013    Orientation RESPIRATION BLADDER Height & Weight     Self  Normal Incontinent Weight: 48.4 kg Height:  5\' 6"  (167.6 cm)  BEHAVIORAL SYMPTOMS/MOOD NEUROLOGICAL BOWEL NUTRITION STATUS      Continent    AMBULATORY STATUS COMMUNICATION OF NEEDS Skin   Extensive Assist Verbally Normal                       Personal Care Assistance Level of Assistance  Bathing, Feeding, Dressing Bathing Assistance: Maximum assistance Feeding assistance: Limited  assistance Dressing Assistance: Maximum assistance     Functional Limitations Info  Sight, Hearing, Speech Sight Info: Adequate Hearing Info: Adequate Speech Info: Adequate    SPECIAL CARE FACTORS FREQUENCY                       Contractures Contractures Info: Not present    Additional Factors Info  Code Status, Allergies, Psychotropic Code Status Info: full Allergies Info: sulfa antibiotics Psychotropic Info: Zoloft         Current Medications (10/15/2018):  This is the current hospital active medication list Current Facility-Administered Medications  Medication Dose Route Frequency Provider Last Rate Last Dose  . 0.45 % NaCl with KCl 20 mEq / L infusion   Intravenous Continuous Kathie Dike, MD 100 mL/hr at 10/15/18 0419    . acetaminophen (TYLENOL) tablet 650 mg  650 mg Oral Q6H PRN Emokpae, Ejiroghene E, MD       Or  . acetaminophen (TYLENOL) suppository 650 mg  650 mg Rectal Q6H PRN Emokpae, Ejiroghene E, MD      . amLODipine (NORVASC) tablet 5 mg  5 mg Oral Daily Kathie Dike, MD   5 mg at 10/14/18 1735  . aspirin EC tablet 81 mg  81 mg Oral Daily Kathie Dike, MD   81 mg at 10/15/18 1050  . atorvastatin (LIPITOR) tablet 20 mg  20 mg Oral q1800 Kathie Dike, MD   20 mg at 10/14/18 1735  . cefTRIAXone (ROCEPHIN)  1 g in sodium chloride 0.9 % 100 mL IVPB  1 g Intravenous Q24H Emokpae, Courage, MD 200 mL/hr at 10/15/18 1139 1 g at 10/15/18 1139  . [START ON 10/16/2018] cephALEXin (KEFLEX) capsule 250 mg  250 mg Oral TID Roxan Hockey, MD      . Derrill Memo ON 10/16/2018] divalproex (DEPAKOTE) DR tablet 125 mg  125 mg Oral QHS Emokpae, Courage, MD      . enoxaparin (LOVENOX) injection 30 mg  30 mg Subcutaneous Q24H Emokpae, Ejiroghene E, MD   30 mg at 10/15/18 1049  . feeding supplement (ENSURE ENLIVE) (ENSURE ENLIVE) liquid 237 mL  237 mL Oral BID BM Emokpae, Ejiroghene E, MD   237 mL at 10/15/18 1050  . labetalol (NORMODYNE) injection 10 mg  10 mg Intravenous  Q2H PRN Emokpae, Ejiroghene E, MD   10 mg at 10/14/18 2301  . levothyroxine (SYNTHROID) tablet 50 mcg  50 mcg Oral Q0600 Emokpae, Ejiroghene E, MD   50 mcg at 10/15/18 0611  . LORazepam (ATIVAN) injection 1 mg  1 mg Intravenous Q5 min PRN Emokpae, Ejiroghene E, MD      . multivitamin with minerals tablet 1 tablet  1 tablet Oral Daily Kathie Dike, MD   1 tablet at 10/15/18 1049  . ondansetron (ZOFRAN) tablet 4 mg  4 mg Oral Q6H PRN Emokpae, Ejiroghene E, MD       Or  . ondansetron (ZOFRAN) injection 4 mg  4 mg Intravenous Q6H PRN Emokpae, Ejiroghene E, MD      . polyethylene glycol (MIRALAX / GLYCOLAX) packet 17 g  17 g Oral Daily PRN Emokpae, Ejiroghene E, MD         Discharge Medications: Please see discharge summary for a list of discharge medications.  Relevant Imaging Results:  Relevant Lab Results:   Additional Information SSN: [id number]  Danyale Ridinger, [place], [place]

## 2018-10-15 NOTE — Care Management Important Message (Signed)
Important Message  Patient Details  Name: Deborah Rivers MRN: [id number] Date of Birth: 1924/05/22   Medicare Important Message Given:  Yes     Tommy Medal 10/15/2018, 2:16 PM

## 2018-10-15 NOTE — Progress Notes (Signed)
Patient confused, agitated, and trying to get OOB without assistance.  Patient has been 1:1 since shift change.  Patient is crying, and is trying to find shoes to go home.  Notified mid-level.  Will continue to monitor patient.

## 2018-10-15 NOTE — Progress Notes (Signed)
Patient Demographics:    Deborah Rivers, is a 85 y.o. female, DOB - [birth year], [place] date - 10/12/2018   Admitting Physician Bethena Roys, MD  Outpatient Primary MD for the patient is Dettinger, Fransisca Kaufmann, MD  LOS - 2   Chief Complaint  Patient presents with   Altered Mental Status        Subjective:    Glenadine Driggers today has no fevers, no emesis,  No chest pain,  Resting comfortably  Assessment  & Plan :    Active Problems:   Essential hypertension   Mixed hyperlipidemia   Dementia (HCC)   CKD (chronic kidney disease), stage III (HCC)   Hypothyroidism   Acute metabolic encephalopathy   Acute lower UTI   AMS (altered mental status)   AKI (acute kidney injury) ([place])   Acute encephalopathy  Brief Narrative: 85 year old female who is a resident of an assisted living facility, with a history of dementia, admitted to the hospital with acute encephalopathy. She is found to have urinary tract infection and dehydration with acute kidney injury. She was admitted for further treatment with IV fluids and antibiotics.   Assessment & Plan:  Active Problems: Essential hypertension Mixed hyperlipidemia Dementia (HCC) CKD (chronic kidney disease), stage III (HCC) Hypothyroidism Acute metabolic encephalopathy Acute lower UTI AMS (altered mental status) AKI (acute kidney injury) ([place]) Acute encephalopathy   1. Acute metabolic encephalopathy. Likely related to dehydration and Klebsiella urinary tract infection. Patient was reportedly unresponsive at her facility. --She appears back to baseline at this time, she is sitting up in the chair awake, she has cognitive deficits at baseline. There was concern for possible seizure during transport to the hospital. She has not had any repeated episodes. MRI imaging of the brain was unremarkable as was  EEG.     2. Klebsiella UTI. treated with IV ceftriaxone, will discharge on p.o. Keflex per sensitivity report    3. acute kidney injury on chronic kidney disease stage III-creatinine peaked at 1.63, with hydration creatinine is back down to 1.0, AKI has resolved   4. Hypothyroidism. Continue on home dose of Synthroid. TSH is 5.368,  Okay to repeat TSH in about 6 weeks  5. Hyperlipidemia. Continue statin  6. Hypertension. Blood pressurestable on Norvasc   Code Status:Full code Family Communication:Discussed with daughter Ms [name] Standard Cure-  Disposition Plan:[facility] facility requesting the patient go to SNF rehab prior to coming to the facility to be transitioned to the memory care unit -Patient is medically cleared for discharge awaiting SNF Rehab bed  Consultants:  Neurology  Procedures:  [id number] recorded evidence of a generalized nonspecific cerebral dysfunction (encephalopathy)   Code Status : full   Family Communication:   (patient is alert, awake and coherent) Discussed with daughter Ms J Cure  DVT Prophylaxis  :  Lovenox  SCDs   Lab Results  Component Value Date   PLT 254 10/13/2018    Inpatient Medications  Scheduled Meds:  amLODipine  5 mg Oral Daily   aspirin EC  81 mg Oral Daily   atorvastatin  20 mg Oral q1800   [START ON 10/16/2018] cephALEXin  250 mg Oral TID   [START ON 10/16/2018] divalproex  125 mg Oral QHS   enoxaparin (LOVENOX)  injection  30 mg Subcutaneous Q24H   feeding supplement (ENSURE ENLIVE)  237 mL Oral BID BM   levothyroxine  50 mcg Oral Q0600   multivitamin with minerals  1 tablet Oral Daily   Continuous Infusions:  0.45 % NaCl with KCl 20 mEq / L 100 mL/hr at 10/15/18 0419   cefTRIAXone (ROCEPHIN)  IV 1 g (10/15/18 1139)   PRN Meds:.acetaminophen **OR** acetaminophen, labetalol, LORazepam, ondansetron **OR** ondansetron (ZOFRAN) IV, polyethylene glycol    Anti-infectives (From admission,  onward)   Start     Dose/Rate Route Frequency Ordered Stop   10/16/18 1000  cephALEXin (KEFLEX) capsule 250 mg     250 mg Oral 3 times daily 10/15/18 1154 10/18/18 0959   10/15/18 1115  cefTRIAXone (ROCEPHIN) 1 g in sodium chloride 0.9 % 100 mL IVPB     1 g 200 mL/hr over 30 Minutes Intravenous Every 24 hours 10/15/18 1108     10/15/18 0000  cephALEXin (KEFLEX) 500 MG capsule     500 mg Oral 3 times daily 10/15/18 1129 10/18/18 2359   10/13/18 1500  cefTRIAXone (ROCEPHIN) 1 g in sodium chloride 0.9 % 100 mL IVPB  Status:  Discontinued     1 g 200 mL/hr over 30 Minutes Intravenous Every 24 hours 10/12/18 1952 10/15/18 1108   10/12/18 1530  cefTRIAXone (ROCEPHIN) 1 g in sodium chloride 0.9 % 100 mL IVPB     1 g 200 mL/hr over 30 Minutes Intravenous  Once 10/12/18 1516 10/12/18 1648        Objective:   Vitals:   10/14/18 2115 10/14/18 2256 10/14/18 2334 10/15/18 0552  BP: (!) 195/99 (!) 179/90 (!) 152/72 137/76  Pulse: 84 80 75 76  Resp: 20   20  Temp: 98.2 F (36.8 C)   98.3 F (36.8 C)  TempSrc: Oral   Oral  SpO2: 100%   98%  Weight:      Height:        Wt Readings from Last 3 Encounters:  10/12/18 48.4 kg  09/11/18 48.4 kg  08/12/18 51.1 kg     Intake/Output Summary (Last 24 hours) at 10/15/2018 1155 Last data filed at 10/15/2018 0552 Gross per 24 hour  Intake 2698.98 ml  Output 1700 ml  Net 998.98 ml   Physical Exam  Gen:- Awake Alert,  In no apparent distress  HEENT:- [place].AT, No sclera icterus Neck-Supple Neck,No JVD,.  Lungs-  CTAB , fair symmetrical air movement CV- S1, S2 normal, regular  Abd-  +ve B.Sounds, Abd Soft, No tenderness,    Extremity/Skin:- No  edema, pedal pulses present  Psych-affect is appropriate, cognitive deficits noted Neuro-generalized weakness without  new focal deficits, no tremors   Data Review:   Micro Results Recent Results (from the past 240 hour(s))  Urine Culture     Status: Abnormal   Collection Time: 10/12/18  2:20 PM    Specimen: Urine, Clean Catch  Result Value Ref Range Status   Specimen Description   Final    URINE, CLEAN CATCH Performed at [facility], [address], [place], [place] [address]    Special Requests   Final    NONE Performed at [facility], [address] [place], [place] [address]    Culture >=100,000 COLONIES/mL KLEBSIELLA PNEUMONIAE (A)  Final   Report Status 10/15/2018 FINAL  Final   Organism ID, Bacteria KLEBSIELLA PNEUMONIAE (A)  Final      Susceptibility   Klebsiella pneumoniae - MIC*    AMPICILLIN >=  32 RESISTANT Resistant     CEFAZOLIN <=4 SENSITIVE Sensitive     CEFTRIAXONE <=1 SENSITIVE Sensitive     CIPROFLOXACIN <=0.25 SENSITIVE Sensitive     GENTAMICIN <=1 SENSITIVE Sensitive     IMIPENEM <=0.25 SENSITIVE Sensitive     NITROFURANTOIN 64 INTERMEDIATE Intermediate     TRIMETH/SULFA <=20 SENSITIVE Sensitive     AMPICILLIN/SULBACTAM <=2 SENSITIVE Sensitive     PIP/TAZO <=4 SENSITIVE Sensitive     Extended ESBL NEGATIVE Sensitive     * >=100,000 COLONIES/mL KLEBSIELLA PNEUMONIAE  SARS Coronavirus 2 [facility] order, Performed in [facility] hospital lab) Nasopharyngeal Nasopharyngeal Swab     Status: None   Collection Time: 10/12/18  4:26 PM   Specimen: Nasopharyngeal Swab  Result Value Ref Range Status   SARS Coronavirus 2 NEGATIVE NEGATIVE Final    Comment: (NOTE) If result is NEGATIVE SARS-CoV-2 target nucleic acids are NOT DETECTED. The SARS-CoV-2 RNA is generally detectable in upper and lower  respiratory specimens during the acute phase of infection. The lowest  concentration of SARS-CoV-2 viral copies this assay can detect is 250  copies / mL. A negative result does not preclude SARS-CoV-2 infection  and should not be used as the sole basis for treatment or other  patient management decisions.  A negative result may occur with  improper specimen collection / handling, submission of specimen other  than nasopharyngeal swab, presence of viral mutation(s)  within the  areas targeted by this assay, and inadequate number of viral copies  (<250 copies / mL). A negative result must be combined with clinical  observations, patient history, and epidemiological information. If result is POSITIVE SARS-CoV-2 target nucleic acids are DETECTED. The SARS-CoV-2 RNA is generally detectable in upper and lower  respiratory specimens dur ing the acute phase of infection.  Positive  results are indicative of active infection with SARS-CoV-2.  Clinical  correlation with patient history and other diagnostic information is  necessary to determine patient infection status.  Positive results do  not rule out bacterial infection or co-infection with other viruses. If result is PRESUMPTIVE POSTIVE SARS-CoV-2 nucleic acids MAY BE PRESENT.   A presumptive positive result was obtained on the submitted specimen  and confirmed on repeat testing.  While 2019 novel coronavirus  (SARS-CoV-2) nucleic acids may be present in the submitted sample  additional confirmatory testing may be necessary for epidemiological  and / or clinical management purposes  to differentiate between  SARS-CoV-2 and other Sarbecovirus currently known to infect humans.  If clinically indicated additional testing with an alternate test  methodology [phone]) is advised. The SARS-CoV-2 RNA is generally  detectable in upper and lower respiratory sp ecimens during the acute  phase of infection. The expected result is Negative. Fact Sheet for Patients:  StrictlyIdeas.no Fact Sheet for Healthcare Providers: BankingDealers.co.za This test is not yet approved or cleared by the [place] FDA and has been authorized for detection and/or diagnosis of SARS-CoV-2 by FDA under an Emergency Use Authorization (EUA).  This EUA will remain in effect (meaning this test can be used) for the duration of the COVID-19 declaration under Section 564(b)(1) of the Act,  21 U.S.C. section 360bbb-3(b)(1), unless the authorization is terminated or revoked sooner. Performed at [facility], [address], [place], [place] [address]    Radiology Reports Ct Head Wo Contrast  Result Date: 10/12/2018 CLINICAL DATA:  Altered level of consciousness EXAM: CT HEAD WITHOUT CONTRAST TECHNIQUE: Contiguous axial images were obtained from the base of the skull  through the vertex without intravenous contrast. COMPARISON:  08/31/2018 FINDINGS: Brain: There is atrophy and chronic small vessel disease changes. No acute intracranial abnormality. Specifically, no hemorrhage, hydrocephalus, mass lesion, acute infarction, or significant intracranial injury. Vascular: No hyperdense vessel or unexpected calcification. Skull: No acute calvarial abnormality. Sinuses/Orbits: Visualized paranasal sinuses and mastoids clear. Orbital soft tissues unremarkable. Other: None IMPRESSION: Atrophy, chronic microvascular disease. No acute intracranial abnormality. Electronically Signed   By: Rolm Baptise M.D.   On: 10/12/2018 16:08   Mr Brain Wo Contrast  Result Date: 10/13/2018 CLINICAL DATA:  Altered mental status with possible seizure activity EXAM: MRI HEAD WITHOUT CONTRAST TECHNIQUE: Multiplanar, multiecho pulse sequences of the brain and surrounding structures were obtained without intravenous contrast. COMPARISON:  Head CT 07/28/2018 FINDINGS: Truncated study, only diffusion, T2, and sagittal T1 weighted imaging was acquired. The sequences are also significantly motion degraded. Advanced atrophy with ventriculomegaly similar to prior. CSF collection along the left parietal convexity also seen on prior and compatible with small chronic hygroma. No evident infarct, hemorrhage, or mass. Bilateral mastoid opacification greater on the right with negative nasopharynx. IMPRESSION: 1. Truncated and significantly motion degraded study. 2. No acute finding. 3. Advanced brain atrophy with chronic small vessel  ischemia. Electronically Signed   By: Monte Fantasia M.D.   On: 10/13/2018 10:27   Dg Chest Port 1 View  Result Date: 10/12/2018 CLINICAL DATA:  Altered mental status EXAM: PORTABLE CHEST 1 VIEW COMPARISON:  07/28/2018 FINDINGS: Heart and mediastinal contours are within normal limits. No focal opacities or effusions. No acute bony abnormality. Aortic atherosclerosis. IMPRESSION: No active disease. Electronically Signed   By: Rolm Baptise M.D.   On: 10/12/2018 17:47   Dg Shoulder Left  Result Date: 10/12/2018 CLINICAL DATA:  Fall, bruising and swelling on left shoulder EXAM: LEFT SHOULDER - 2+ VIEW COMPARISON:  None. FINDINGS: Degenerative changes in the [facility] joint with joint space narrowing and spurring. Glenohumeral joint is maintained. No acute bony abnormality. Specifically, no fracture, subluxation, or dislocation. Soft tissues are intact. IMPRESSION: Degenerative changes in the left AC joint. No acute bony abnormality. Electronically Signed   By: Rolm Baptise M.D.   On: 10/12/2018 17:46   Dg Hip Unilat W Or W/o Pelvis 2-3 Views Left  Result Date: 09/23/2018 CLINICAL DATA:  Left hip pain.  Fall EXAM: DG HIP (WITH OR WITHOUT PELVIS) 2-3V LEFT COMPARISON:  None. FINDINGS: Soft tissue swelling laterally, likely hematoma. Mild degenerative changes in the hip joints bilaterally, symmetric. SI joints symmetric and unremarkable. No acute bony abnormality. Specifically, no fracture, subluxation, or dislocation. IMPRESSION: No acute bony abnormality. Lateral soft tissue prominence, likely related to soft tissue hematoma. Electronically Signed   By: Rolm Baptise M.D.   On: 09/23/2018 19:45     CBC Recent Labs  Lab 10/12/18 1428 10/12/18 1441 10/13/18 0606  WBC 10.2  --  8.1  HGB 11.2* 11.9* 9.9*  HCT 36.6 35.0* 32.6*  PLT 279  --  254  MCV 92.7  --  92.1  MCH 28.4  --  28.0  MCHC 30.6  --  30.4  RDW 14.4  --  14.2  LYMPHSABS 0.9  --   --   MONOABS 0.7  --   --   EOSABS 0.1  --   --   BASOSABS  0.1  --   --     Chemistries  Recent Labs  Lab 10/12/18 1428 10/12/18 1441 10/12/18 1457 10/13/18 0606 10/14/18 0507 10/15/18 0609  NA 143 145  --  145 137 137  K 4.2 4.2  --  3.3* 3.1* 4.3  CL 110 110  --  113* 107 109  CO2 23  --   --  23 22 17*  GLUCOSE 105* 97  --  83 84 82  BUN 39* 35*  --  33* 24* 24*  CREATININE 1.63* 1.60*  --  1.09* 0.89 1.00  CALCIUM 9.4  --   --  8.8* 8.7* 8.5*  MG  --   --  2.3  --   --  2.1  AST 21  --   --   --   --   --   ALT 14  --   --   --   --   --   ALKPHOS 64  --   --   --   --   --   BILITOT 1.0  --   --   --   --   --    ------------------------------------------------------------------------------------------------------------------ No results for input(s): CHOL, HDL, LDLCALC, TRIG, CHOLHDL, LDLDIRECT in the last 72 hours.  No results found for: HGBA1C ------------------------------------------------------------------------------------------------------------------ Recent Labs    10/12/18 1727  TSH 5.368*   ------------------------------------------------------------------------------------------------------------------ No results for input(s): VITAMINB12, FOLATE, FERRITIN, TIBC, IRON, RETICCTPCT in the last 72 hours.  Coagulation profile Recent Labs  Lab 10/12/18 1428  INR 1.1    No results for input(s): DDIMER in the last 72 hours.  Cardiac Enzymes No results for input(s): CKMB, TROPONINI, MYOGLOBIN in the last 168 hours.  Invalid input(s): CK ------------------------------------------------------------------------------------------------------------------ No results found for: BNP   Roxan Hockey M.D on 10/15/2018 at 11:55 AM  Go to [email or web address] - for contact info  [facility] - Office  [phone]

## 2018-10-15 NOTE — Discharge Summary (Signed)
Deborah Rivers, is a 85 y.o. female  DOB 1924/11/10  MRN [id number].  Admission date:  10/12/2018  Admitting Physician  Bethena Roys, MD  Discharge Date:  10/15/2018   Primary MD  Dettinger, Fransisca Kaufmann, MD  Recommendations for primary care physician for things to follow:   1) Depakote that is been changed to 125 mg every morning and nightly--- to avoid excessive daytime sleepiness/drowsiness 2) complete Keflex for Klebsiella UTI as prescribed 3) repeat BMP blood test with PCP in about a week or so 4) okay to transition to memory care unit 5) Ensure/nutritional supplement 1 can twice a day advised  Admission Diagnosis  Seizure ([place]) [R56.9] Acute encephalopathy [G93.40] Acute kidney injury ([place]) [N17.9] Urinary tract infection without hematuria, site unspecified [N39.0] AMS (altered mental status) [R41.82]   Discharge Diagnosis  Seizure ([place]) [R56.9] Acute encephalopathy [G93.40] Acute kidney injury ([place]) [N17.9] Urinary tract infection without hematuria, site unspecified [N39.0] AMS (altered mental status) [R41.82]    Active Problems:   Essential hypertension   Mixed hyperlipidemia   Dementia ([place])   CKD (chronic kidney disease), stage III ([place])   Hypothyroidism   Acute metabolic encephalopathy   Acute lower UTI   AMS (altered mental status)   AKI (acute kidney injury) ([place])   Acute encephalopathy      Past Medical History:  Diagnosis Date   Allergy    Anemia    Anxiety    Cataract    Depression    Diverticulitis    Essential hypertension    GERD (gastroesophageal reflux disease)    Hyperlipidemia    Hypothyroidism    Recurrent UTI     Past Surgical History:  Procedure Laterality Date   APPENDECTOMY     EYE SURGERY     HERNIA REPAIR     TOTAL VAGINAL HYSTERECTOMY         HPI  from the history and physical done on the day of admission:    PCP:  Dettinger, Fransisca Kaufmann, MD   Patient coming from: [facility] facility, [place].  I have personally briefly reviewed patient's old medical records in [place]  Chief Complaint: Altered mental status  HPI: Deborah Rivers is a 85 y.o. female with medical history significant for hypertension, CKD 3, depression and anxiety, brought to the ED with reports of unresponsiveness.  History is obtained from chart review, I reached out to the nursing home-unfortunately got a recorded message, I talked to patient's son Robert-listed as patient contact, he has not seen patient in a long while. As at the time of my evaluation patient though awake, is muttering incoherently, repeating the same words over. Patient's was last seen normal sitting in her room in her wheelchair earlier this morning, later she was found unresponsive.  Per paramedics, patient did have some seizure-like activity in route to the hospital when she became stiff.  Stiffness was short-lived and resolved spontaneously.  There was no tongue biting or incontinence.  Patient's son Herbie [place] denies knowing any history of seizures,  reports prior episodes of confusion in the setting of UTI, last 02/2018.  ED Course: Blood pressure systolic [id number] to [id number], O2 sats greater than 94% on nasal cannula, heart rate 70s to 80s, UA with moderate leukocytes greater than 50 WBCs and many bacteria.  WBC 10.2.  Creatinine elevated 1.63.  Reported acid level 29.  Head CT negative for acute abnormality EKG sinus rhythm without significant change from prior.  IV fluid normal saline 150 cc/hr started in ED, IV ceftriaxone given for UTI.  Hospitalist to admit for altered mental status.  Review of Systems: Unable to assess due to patient's mental status.   Hospital Course:   Brief Narrative:  85 year old female who is a resident of an assisted living facility, with a history of dementia, admitted to the hospital with acute encephalopathy.  She is found to have  urinary tract infection and dehydration with acute kidney injury.  She was admitted for further treatment with IV fluids and antibiotics.   Assessment & Plan:   Active Problems:   Essential hypertension   Mixed hyperlipidemia   Dementia (HCC)   CKD (chronic kidney disease), stage III (HCC)   Hypothyroidism   Acute metabolic encephalopathy   Acute lower UTI   AMS (altered mental status)   AKI (acute kidney injury) ([place])   Acute encephalopathy   1. Acute metabolic encephalopathy.  Likely related to dehydration and Klebsiella urinary tract infection.  Patient was reportedly unresponsive at her facility.  --She appears back to baseline at this time, she is sitting up in the chair awake, she has cognitive deficits at baseline.  There was concern for possible seizure during transport to the hospital.  She has not had any repeated episodes.  MRI imaging of the brain was unremarkable as was EEG.   2. Klebsiella UTI.  treated with IV ceftriaxone, will discharge on p.o. Keflex per sensitivity report    3. acute kidney injury on chronic kidney disease stage III-creatinine peaked at 1.63, with hydration creatinine is back down to 1.0, AKI has resolved    4. Hypothyroidism.  Continue on home dose of Synthroid.  TSH is 5.368,  Okay to repeat TSH in about 6 weeks  5. Hyperlipidemia.  Continue statin  6. Hypertension.  Blood pressure stable on Norvasc   Code Status: Full code Family Communication: Discussed with daughter Ms [name] Standard Cure- today prior to discharge   Disposition Plan: Return to [facility] facility, being transitioned to the memory care unit  Consultants:   Neurology  Procedures:   [id number] recorded evidence of a generalized nonspecific cerebral dysfunction (encephalopathy)  Discharge Condition: stable Follow UP--- PCP in  1 week   Diet and Activity recommendation:  As advised  Discharge Instructions    Discharge Instructions    Call MD for:  difficulty  breathing, headache or visual disturbances   Complete by: As directed    Call MD for:  persistant dizziness or light-headedness   Complete by: As directed    Call MD for:  persistant nausea and vomiting   Complete by: As directed    Call MD for:  temperature >100.4   Complete by: As directed    Diet general   Complete by: As directed    Discharge instructions   Complete by: As directed    1) Depakote that is been changed to 125 mg every morning and nightly--- to avoid excessive daytime sleepiness/drowsiness 2) complete Keflex for Klebsiella UTI as prescribed 3) repeat BMP blood test with PCP in about a  week or so 4) okay to transition to memory care unit 5) Ensure/nutritional supplement 1 can twice a day advised   Increase activity slowly   Complete by: As directed        Discharge Medications     Allergies as of 10/15/2018      Reactions   Sulfa Antibiotics Nausea Only, Anxiety, Rash      Medication List    STOP taking these medications   megestrol 20 MG tablet Commonly known as: MEGACE Replaced by: megestrol 400 MG/10ML suspension     TAKE these medications   acetaminophen 325 MG tablet Commonly known as: TYLENOL Take 2 tablets (650 mg total) by mouth every 6 (six) hours as needed for mild pain or headache (or Fever >/= 101).   amLODipine 5 MG tablet Commonly known as: NORVASC Take 1 tablet (5 mg total) by mouth daily.   aspirin 81 MG tablet Take 1 tablet (81 mg total) by mouth daily with breakfast. What changed: when to take this   atorvastatin 20 MG tablet Commonly known as: LIPITOR Take 1 tablet (20 mg total) by mouth daily at 6 PM.   CENTRUM SILVER PO Take 1 tablet by mouth daily.   cephALEXin 500 MG capsule Commonly known as: Keflex Take 1 capsule (500 mg total) by mouth 3 (three) times daily for 3 days.   diclofenac sodium 1 % Gel Commonly known as: VOLTAREN Apply 2 g topically 4 (four) times daily.   divalproex 125 MG DR tablet Commonly known  as: DEPAKOTE Take 1 tablet (125 mg total) by mouth See admin instructions. 1 tablet every morning and 1 tablet nightly  (total of 2 tablets/day) What changed:   when to take this  additional instructions   ergocalciferol 1.25 MG (50000 UT) capsule Commonly known as: VITAMIN D2 Take 50,000 Units by mouth once a week. Mon   ferrous gluconate 324 MG tablet Commonly known as: FERGON Take 1 tablet (324 mg total) by mouth daily with breakfast.   fluticasone 50 MCG/ACT nasal spray Commonly known as: FLONASE Place 2 sprays into both nostrils daily. What changed:   when to take this  reasons to take this   megestrol 400 MG/10ML suspension Commonly known as: MEGACE Take 10 mLs (400 mg total) by mouth daily. --For appetite stimulation Replaces: megestrol 20 MG tablet   ondansetron 4 MG tablet Commonly known as: ZOFRAN Take 1 tablet (4 mg total) by mouth every 6 (six) hours as needed for nausea.   potassium chloride SA 20 MEQ tablet Commonly known as: K-DUR Take 1 tablet (20 mEq total) by mouth 2 (two) times daily.   senna-docusate 8.6-50 MG tablet Commonly known as: Senokot-S Take 2 tablets by mouth at bedtime.   sertraline 50 MG tablet Commonly known as: ZOLOFT Take 1 tablet (50 mg total) by mouth daily.   Synthroid 50 MCG tablet Generic drug: levothyroxine Take 1 tablet (50 mcg total) by mouth daily.   traZODone 50 MG tablet Commonly known as: DESYREL Take 1 tablet (50 mg total) by mouth at bedtime as needed for sleep (as Needed for sleep). What changed:   how much to take  how to take this  when to take this  reasons to take this      Major procedures and Radiology Reports - PLEASE review detailed and final reports for all details, in brief -    Ct Head Wo Contrast  Result Date: 10/12/2018 CLINICAL DATA:  Altered level of consciousness EXAM: CT HEAD WITHOUT  CONTRAST TECHNIQUE: Contiguous axial images were obtained from the base of the skull through the  vertex without intravenous contrast. COMPARISON:  08/31/2018 FINDINGS: Brain: There is atrophy and chronic small vessel disease changes. No acute intracranial abnormality. Specifically, no hemorrhage, hydrocephalus, mass lesion, acute infarction, or significant intracranial injury. Vascular: No hyperdense vessel or unexpected calcification. Skull: No acute calvarial abnormality. Sinuses/Orbits: Visualized paranasal sinuses and mastoids clear. Orbital soft tissues unremarkable. Other: None IMPRESSION: Atrophy, chronic microvascular disease. No acute intracranial abnormality. Electronically Signed   By: Rolm Baptise M.D.   On: 10/12/2018 16:08   Mr Brain Wo Contrast  Result Date: 10/13/2018 CLINICAL DATA:  Altered mental status with possible seizure activity EXAM: MRI HEAD WITHOUT CONTRAST TECHNIQUE: Multiplanar, multiecho pulse sequences of the brain and surrounding structures were obtained without intravenous contrast. COMPARISON:  Head CT 07/28/2018 FINDINGS: Truncated study, only diffusion, T2, and sagittal T1 weighted imaging was acquired. The sequences are also significantly motion degraded. Advanced atrophy with ventriculomegaly similar to prior. CSF collection along the left parietal convexity also seen on prior and compatible with small chronic hygroma. No evident infarct, hemorrhage, or mass. Bilateral mastoid opacification greater on the right with negative nasopharynx. IMPRESSION: 1. Truncated and significantly motion degraded study. 2. No acute finding. 3. Advanced brain atrophy with chronic small vessel ischemia. Electronically Signed   By: Monte Fantasia M.D.   On: 10/13/2018 10:27   Dg Chest Port 1 View  Result Date: 10/12/2018 CLINICAL DATA:  Altered mental status EXAM: PORTABLE CHEST 1 VIEW COMPARISON:  07/28/2018 FINDINGS: Heart and mediastinal contours are within normal limits. No focal opacities or effusions. No acute bony abnormality. Aortic atherosclerosis. IMPRESSION: No active  disease. Electronically Signed   By: Rolm Baptise M.D.   On: 10/12/2018 17:47   Dg Shoulder Left  Result Date: 10/12/2018 CLINICAL DATA:  Fall, bruising and swelling on left shoulder EXAM: LEFT SHOULDER - 2+ VIEW COMPARISON:  None. FINDINGS: Degenerative changes in the [facility] joint with joint space narrowing and spurring. Glenohumeral joint is maintained. No acute bony abnormality. Specifically, no fracture, subluxation, or dislocation. Soft tissues are intact. IMPRESSION: Degenerative changes in the left AC joint. No acute bony abnormality. Electronically Signed   By: Rolm Baptise M.D.   On: 10/12/2018 17:46   Dg Hip Unilat W Or W/o Pelvis 2-3 Views Left  Result Date: 09/23/2018 CLINICAL DATA:  Left hip pain.  Fall EXAM: DG HIP (WITH OR WITHOUT PELVIS) 2-3V LEFT COMPARISON:  None. FINDINGS: Soft tissue swelling laterally, likely hematoma. Mild degenerative changes in the hip joints bilaterally, symmetric. SI joints symmetric and unremarkable. No acute bony abnormality. Specifically, no fracture, subluxation, or dislocation. IMPRESSION: No acute bony abnormality. Lateral soft tissue prominence, likely related to soft tissue hematoma. Electronically Signed   By: Rolm Baptise M.D.   On: 09/23/2018 19:45   Micro Results   Recent Results (from the past 240 hour(s))  Urine Culture     Status: Abnormal   Collection Time: 10/12/18  2:20 PM   Specimen: Urine, Clean Catch  Result Value Ref Range Status   Specimen Description   Final    URINE, CLEAN CATCH Performed at [facility], [address] [place], [place] [address]    Special Requests   Final    NONE Performed at [facility], [address] [place], [place] [address]    Culture >=100,000 COLONIES/mL KLEBSIELLA PNEUMONIAE (A)  Final   Report Status 10/15/2018 FINAL  Final   Organism ID, Bacteria KLEBSIELLA PNEUMONIAE (A)  Final      Susceptibility   Klebsiella pneumoniae - MIC*    AMPICILLIN >=32 RESISTANT Resistant     CEFAZOLIN <=4  SENSITIVE Sensitive     CEFTRIAXONE <=1 SENSITIVE Sensitive     CIPROFLOXACIN <=0.25 SENSITIVE Sensitive     GENTAMICIN <=1 SENSITIVE Sensitive     IMIPENEM <=0.25 SENSITIVE Sensitive     NITROFURANTOIN 64 INTERMEDIATE Intermediate     TRIMETH/SULFA <=20 SENSITIVE Sensitive     AMPICILLIN/SULBACTAM <=2 SENSITIVE Sensitive     PIP/TAZO <=4 SENSITIVE Sensitive     Extended ESBL NEGATIVE Sensitive     * >=100,000 COLONIES/mL KLEBSIELLA PNEUMONIAE  SARS Coronavirus 2 [facility] order, Performed in [facility] hospital lab) Nasopharyngeal Nasopharyngeal Swab     Status: None   Collection Time: 10/12/18  4:26 PM   Specimen: Nasopharyngeal Swab  Result Value Ref Range Status   SARS Coronavirus 2 NEGATIVE NEGATIVE Final    Comment: (NOTE) If result is NEGATIVE SARS-CoV-2 target nucleic acids are NOT DETECTED. The SARS-CoV-2 RNA is generally detectable in upper and lower  respiratory specimens during the acute phase of infection. The lowest  concentration of SARS-CoV-2 viral copies this assay can detect is 250  copies / mL. A negative result does not preclude SARS-CoV-2 infection  and should not be used as the sole basis for treatment or other  patient management decisions.  A negative result may occur with  improper specimen collection / handling, submission of specimen other  than nasopharyngeal swab, presence of viral mutation(s) within the  areas targeted by this assay, and inadequate number of viral copies  (<250 copies / mL). A negative result must be combined with clinical  observations, patient history, and epidemiological information. If result is POSITIVE SARS-CoV-2 target nucleic acids are DETECTED. The SARS-CoV-2 RNA is generally detectable in upper and lower  respiratory specimens dur ing the acute phase of infection.  Positive  results are indicative of active infection with SARS-CoV-2.  Clinical  correlation with patient history and other diagnostic information is  necessary  to determine patient infection status.  Positive results do  not rule out bacterial infection or co-infection with other viruses. If result is PRESUMPTIVE POSTIVE SARS-CoV-2 nucleic acids MAY BE PRESENT.   A presumptive positive result was obtained on the submitted specimen  and confirmed on repeat testing.  While 2019 novel coronavirus  (SARS-CoV-2) nucleic acids may be present in the submitted sample  additional confirmatory testing may be necessary for epidemiological  and / or clinical management purposes  to differentiate between  SARS-CoV-2 and other Sarbecovirus currently known to infect humans.  If clinically indicated additional testing with an alternate test  methodology [phone]) is advised. The SARS-CoV-2 RNA is generally  detectable in upper and lower respiratory sp ecimens during the acute  phase of infection. The expected result is Negative. Fact Sheet for Patients:  StrictlyIdeas.no Fact Sheet for Healthcare Providers: BankingDealers.co.za This test is not yet approved or cleared by the [place] FDA and has been authorized for detection and/or diagnosis of SARS-CoV-2 by FDA under an Emergency Use Authorization (EUA).  This EUA will remain in effect (meaning this test can be used) for the duration of the COVID-19 declaration under Section 564(b)(1) of the Act, 21 U.S.C. section 360bbb-3(b)(1), unless the authorization is terminated or revoked sooner. Performed at [facility], [address], [place], [place] [address]     Today   Subjective    Rajean Urwin today has no new concerns,  Pt has generalized weakness - oral intake is fair        Patient has been seen and examined prior to discharge   Objective   Blood pressure 137/76, pulse 76, temperature 98.3 F (36.8 C), temperature source Oral, resp. rate 20, height 5\' 6"  (1.676 m), weight 48.4 kg, SpO2 98 %.   Intake/Output Summary (Last 24 hours) at  10/15/2018 1130 Last data filed at 10/15/2018 0552 Gross per 24 hour  Intake 2698.98 ml  Output 1700 ml  Net 998.98 ml   Exam Gen:- Awake Alert, no acute distress  HEENT:- [place].AT, No sclera icterus Neck-Supple Neck,No JVD,.  Lungs-  CTAB , good air movement bilaterally  CV- S1, S2 normal, regular Abd-  +ve B.Sounds, Abd Soft, No tenderness,    Extremity/Skin:- No  edema,   good pulses Psych-affect is appropriate, - Neuro-Generalized weakness, no new focal deficits, no tremors    Data Review   CBC w Diff:  Lab Results  Component Value Date   WBC 8.1 10/13/2018   HGB 9.9 (L) 10/13/2018   HGB 9.6 (L) 08/12/2018   HCT 32.6 (L) 10/13/2018   HCT 29.4 (L) 08/12/2018   PLT 254 10/13/2018   PLT 280 08/12/2018   LYMPHOPCT 9 10/12/2018   MONOPCT 7 10/12/2018   EOSPCT 1 10/12/2018   BASOPCT 1 10/12/2018    CMP:  Lab Results  Component Value Date   NA 137 10/15/2018   NA 146 (H) 08/12/2018   K 4.3 10/15/2018   CL 109 10/15/2018   CO2 17 (L) 10/15/2018   BUN 24 (H) 10/15/2018   BUN 28 08/12/2018   CREATININE 1.00 10/15/2018   PROT 7.0 10/12/2018   PROT 5.9 (L) 04/14/2018   ALBUMIN 3.7 10/12/2018   ALBUMIN 4.0 04/14/2018   BILITOT 1.0 10/12/2018   BILITOT 0.3 04/14/2018   ALKPHOS 64 10/12/2018   AST 21 10/12/2018   ALT 14 10/12/2018  .  Total Discharge time is about 33 minutes  Roxan Hockey M.D on 10/15/2018 at 11:30 AM  Go to [email or web address] -  for contact info  [facility] - Office  [phone]

## 2018-10-15 NOTE — Progress Notes (Signed)
Nursing Home Choices   [facility] [address] [place], [place] [address] [phone]   Add [facility] My [facility]- Opens in a new window 5 out of 5 starsfootnote Much Above Average 5 out of 5 starsfootnote Much Above Average 3 out of 5 starsfootnote Average 3 out of 5 starsfootnote Average 2.9 Miles [facility] [address] [place], [place] [address] [phone]   Add [facility] My Favorites- Opens in a new window 2 out of 5 starsfootnote Below Average 2 out of 5 starsfootnote Below Average 2 out of 5 starsfootnote Below Average 2 out of 5 starsfootnote Below Average 3.0 Miles [facility] [address] [place], [place] [address] [phone]   Add [facility] My Favorites- Opens in a new window 4 out of 5 starsfootnote Above Average 4 out of 5 starsfootnote Above Average 2 out of 5 starsfootnote Below Average 4 out of 5 starsfootnote Above Average 10.7 Miles [facility] [address] [place], [place] [address] [phone]   Add [facility] My Favorites- Opens in a new window 3 out of 5 starsfootnote Average 3 out of 5 starsfootnote Average 2 out of 5 starsfootnote Below Average 3 out of 5 starsfootnote Average 12.5 Miles [facility] [address] [place], [place] [address] [phone]   Add [facility] My Favorites- Opens in a new window 1 out of 5 starsfootnote Much Below Average 2 out of 5 starsfootnote Below Average 1 out of 5 starsfootnote Much Below Average 3 out of 5 starsfootnote Average 14.7 Miles [facility] [address] [place], [place] [address] [phone]   Add [facility] My Favor[facility]- Opens in a new window 4 out of 5 starsfootnote Above Average 3 out of 5 starsfootnote Average 2 out of 5  starsfootnote Below Average 5 out of 5 starsfootnote Much Above Average 22.5 Miles [facility] [address] [place], [place] [address] [phone]   Add [facility] My Favorites- Opens in a new window 1 out of 5 starsfootnote Much Below Average 1 out of 5 starsfootnote Much Below Average 2 out of 5 starsfootnote Below Average 2 out of 5 starsfootnote Below Average 24.2 Miles [facility] [address] [place], [place] [address] [phone]   Add [facility] My Favorites- Opens in a new window 3 out of 5 starsfootnote Average 3 out of 5 starsfootnote Average 2 out of 5 starsfootnote Below Average 2 out of 5 starsfootnote Below Average 24.4 Miles [facility] [address] [place], [place] [address] [phone]   Add [facility] My Favorites- Opens in a new window 1 out of 5 starsfootnote Much Below Average 1 out of 5 starsfootnote Much Below Average 2 out of 5 starsfootnote Below Average 3 out of 5 starsfootnote Average 25.5 Miles [facility] [address] [place], [place] [address] [phone]   Add [facility] [place] My Favorites- Opens in a new window 2 out of 5 starsfootnote Below Average 2 out of 5 starsfootnote Below Average 2 out of 5 starsfootnote Below Average 3 out of 5 starsfootnote Average 25.9 Miles [facility] [place] [address] [place], [place] [address] [phone]   Add [facility] My Favorites-   Opens in a new window 5 out of 5 starsfootnote Much Above Average 5 out of 5 starsfootnote Much Above Average 3 out of 5 starsfootnote Average 2 out of 5 starsfootnote Below Average 26.1 Miles [facility] [address] [place] [address] [phone]   Add [facility] My Favorites- Opens in a new window 3 out of 5 starsfootnote Average 3 out of 5 starsfootnote Average 2 out of 5 starsfootnote Below Average 4 out of 5 starsfootnote Above Average 26.7 Miles [facility] [address] [place] [address] [phone]   Add [facility] My Favorites- Opens in a new window 3 out of 5 starsfootnote Average 3 out of 5 starsfootnote Average 3 out of 5 starsfootnote Average 4 out of 5 starsfootnote Above Average 27.0 Miles [facility] [place] [facility] [address] [place], [place] [address] [phone]   Add [facility] [place] [facility] My Favorites- Opens in a new window 2 out of 5 starsfootnote Below Average 2 out of 5 starsfootnote Below Average 2 out of 5 starsfootnote Below Average 2 out of 5 starsfootnote Below Average 27.0 Miles [facility] [address] [place] [address] [phone]   Add [facility] My Favorites- Opens in a new window 1 out of 5 starsfootnote Much Below Average 1 out of 5 starsfootnote Much Below Average 2 out of 5 starsfootnote Below Average 1 out of 5 starsfootnote Much Below Average 27.1 Miles [facility] [address] [place] [address] [phone]   Add [facility] My Favorites- Opens in a new window 3 out of 5 starsfootnote Average 3 out of 5 starsfootnote Average 2 out of 5 starsfootnote Below Average 4 out of 5 starsfootnote Above Average 27.8 Miles [facility] [address] [place], [place] [address] [phone]   Add [facility] My Favorites- Opens in a new window 5 out of 5 starsfootnote Much Above Average 5 out of 5 starsfootnote Much Above Average 5 out of 5 starsfootnote Much Above  Average 5 out of 5 starsfootnote Much Above Average 28.0 Miles [facility] [address] [place], [place] [address] [phone]   Add [facility] My Favorites- Opens in a new window 5 out of 5 starsfootnote Much Above Average 4 out of 5 starsfootnote Above Average 5 out of 5 starsfootnote Much Above Average 5 out of 5 starsfootnote Much Above Average 28.1 Miles [facility] [address] [place], [place] [address] [phone]   Add [facility] My Favorites- Opens in a new window 5 out of 5 starsfootnote Much Above Average 4 out of 5 starsfootnote Above Average 5 out of 5 starsfootnote Much Above Average 5 out of 5 starsfootnote Much Above Average 28.2 Miles [facility] [address] [place], [place] [address] [phone]   Add [facility] My Favorites- Opens in a new window 5 out of 5 starsfootnote Much Above Average 4 out of 5 starsfootnote Above Average 5 out of 5 starsfootnote Much Above Average 5 out of 5 starsfootnote Much Above Average 28.3 Miles  

## 2018-10-15 NOTE — TOC Progression Note (Signed)
Transition of Care [facility]) - Progression Note    Patient Details  Name: Deborah Rivers MRN: [id number] Date of Birth: 18-Sep-1924  Transition of Care [facility]) CM/SW Contact  Boneta Lucks, RN Phone Number: 10/15/2018, 1:12 PM  Clinical Narrative:   Patient is medically ready for discharge. Per PT note patient is not walking. Spoke with Juliann Pulse at [facility]. She states patient will need SNF before she can return. Spoke with Romie Minus - daughter she wanted referrals sent to [facility], [facility] and [facility]. FL2 done a referrals sent. MD updated.     Expected Discharge Plan: Assisted Living Barriers to Discharge: Continued Medical Work up  Expected Discharge Plan and Services Expected Discharge Plan: Assisted Living       Living arrangements for the past 2 months: [place] Expected Discharge Date: 10/15/18                              Readmission Risk Interventions No flowsheet data found.

## 2018-10-16 MED ORDER — TRAZODONE HCL 50 MG PO TABS
50.0000 mg | ORAL_TABLET | Freq: Every day | ORAL | Status: DC
  Administered 2018-10-16 – 2018-10-18 (×3): 50 mg via ORAL
  Filled 2018-10-16 (×3): qty 1

## 2018-10-16 MED ORDER — DIVALPROEX SODIUM 125 MG PO CSDR
125.0000 mg | DELAYED_RELEASE_CAPSULE | Freq: Every morning | ORAL | Status: DC
  Administered 2018-10-18 – 2018-10-22 (×5): 125 mg via ORAL
  Filled 2018-10-16 (×5): qty 1

## 2018-10-16 NOTE — Progress Notes (Signed)
Pt is still confused this morning, though pleasantly confused. She did not require a 1:1 sitter last night, the night shift RN just sat with the patient for an extended period of time to calm her down and it seemed to work in addition to the one time dose of Haldol. Will continue to monitor.

## 2018-10-16 NOTE — Progress Notes (Signed)
Physical Therapy Treatment Patient Details Name: Deborah Rivers MRN: [id number] DOB: [birth year] Today's Date: 10/16/2018    History of Present Illness This patient is a 85 year old female, she is ill-appearing today as she presents with altered mental status.  It is unclear exactly what happened however the patient was last seen earlier this morning sitting in a wheelchair in her room and was later found unresponsive.  The patient is unable to give me any information, she mumbles incoherently.  She does appear to be somnolent but arousable to voice.  Based on the medical record medication chart that accompanies the patient she does take Depakote this is thought to be a mood stabilizer and not for seizures.  She also takes iron, levothyroxine, mag gastral, sertraline, Ativan as needed, trazodone for sleep and milk of magnesia and Maalox for constipation.  The patient does use a diclofenac topical gel for aches and pains.    PT Comments    Patient confused during today's session and not alert to location/time/situation. Increased time and frequent loss of attention during today's session, but patient agreeable to physical therapy and able to perform exercises with tactile cues/assist and in sequence demonstration.  Able to take a few lateral steps at bedside with walker and mod assist. Unable to stand without support as patients leans backwards despite tactile and verbal cues.  Patient will continue to benefit from skilled physical therapy in hospital and recommended venue below to increase strength, balance, endurance for safe ADLs and gait.    Follow Up Recommendations  SNF;Supervision/Assistance - 24 hour;Supervision for mobility/OOB     Equipment Recommendations       Recommendations for Other Services       Precautions / Restrictions Precautions Precautions: Fall Restrictions Weight Bearing Restrictions: No    Mobility  Bed Mobility Overal bed mobility: Needs Assistance Bed  Mobility: Rolling;Sidelying to Sit;Supine to Sit;Sit to Supine Rolling: Mod assist Sidelying to sit: Mod assist Supine to sit: Mod assist Sit to supine: Mod assist   General bed mobility comments: verbal cues throughout  Transfers Overall transfer level: Needs assistance   Transfers: Stand Pivot Transfers;Lateral/Scoot Transfers;Sit to/from Stand Sit to Stand: Mod assist Stand pivot transfers: Mod assist      Lateral/Scoot Transfers: Mod assist General transfer comment: verbal and tactile cues  Ambulation/Gait                 Stairs             Wheelchair Mobility    Modified Rankin (Stroke Patients Only)       Balance Overall balance assessment: Needs assistance Sitting-balance support: Feet supported;Bilateral upper extremity supported Sitting balance-Leahy Scale: Poor Sitting balance - Comments: able to sit without external assist for 5-10 sec at a time Postural control: Posterior lean;Left lateral lean Standing balance support: Bilateral upper extremity supported Standing balance-Leahy Scale: Poor Standing balance comment: unable to stand up and maintain balance without min assist, posterior lean                            Cognition Arousal/Alertness: Awake/alert Behavior During Therapy: Restless Overall Cognitive Status: Impaired/Different from baseline Area of Impairment: Orientation;Following commands;Safety/judgement;Problem solving;Attention;Memory                 Orientation Level: Disoriented to;Place;Time;Situation Current Attention Level: Selective Memory: Decreased short-term memory Following Commands: Follows one step commands inconsistently;Follows one step commands with increased time Safety/Judgement: Decreased awareness of safety  Problem Solving: Slow processing;Difficulty sequencing;Requires verbal cues;Requires tactile cues General Comments: easily confused      Exercises General Exercises - Lower  Extremity Ankle Circles/Pumps: PROM;AAROM;Strengthening;Both;10 reps;Supine Heel Slides: PROM;AAROM;Strengthening;Both;10 reps;Supine Heel Raises: AROM;Strengthening;Both;10 reps;Seated    General Comments        Pertinent Vitals/Pain Pain Assessment: No/denies pain    Home Living                      Prior Function            PT Goals (current goals can now be found in the care plan section) Acute Rehab PT Goals PT Goal Formulation: Patient unable to participate in goal setting Progress towards PT goals: Progressing toward goals    Frequency    Min 3X/week      PT Plan Current plan remains appropriate    Co-evaluation              AM-PAC PT "6 Clicks" Mobility   Outcome Measure  Help needed turning from your back to your side while in a flat bed without using bedrails?: A Little Help needed moving from lying on your back to sitting on the side of a flat bed without using bedrails?: A Little Help needed moving to and from a bed to a chair (including a wheelchair)?: A Lot Help needed standing up from a chair using your arms (e.g., wheelchair or bedside chair)?: A Lot Help needed to walk in hospital room?: Total Help needed climbing 3-5 steps with a railing? : Total 6 Click Score: 12    End of Session   Activity Tolerance: Patient limited by fatigue Patient left: in bed;with bed alarm set Nurse Communication: Mobility status PT Visit Diagnosis: Unsteadiness on feet (R26.81);Muscle weakness (generalized) (M62.81);Difficulty in walking, not elsewhere classified (R26.2);Other abnormalities of gait and mobility (R26.89)     Time: [id number] PT Time Calculation (min) (ACUTE ONLY): 24 min  Charges:  $Therapeutic Exercise: 8-22 mins $Therapeutic Activity: 8-22 mins                     3:15 PM, 10/16/18 Jerene Pitch, DPT Physical Therapy with [facility]  [phone] office

## 2018-10-16 NOTE — Progress Notes (Signed)
Pharmacy called and wanted to verify Depakote medication, since two different types of Depakote ordered for patient.  Notified mid-level.  Mid-level gave no new orders or clarification on medications, stated it would have to be addressed by attending tomorrow.

## 2018-10-16 NOTE — Progress Notes (Signed)
Patient Demographics:    Deborah Rivers, is a 85 y.o. female, DOB - 03-Nov-1924, [place] date - 10/12/2018   Admitting Physician Ejiroghene Arlyce Dice, MD  Outpatient Primary MD for the patient is Dettinger, Fransisca Kaufmann, MD  LOS - 3   Chief Complaint  Patient presents with   Altered Mental Status        Subjective:    Deborah Rivers today has no fevers, no emesis,  No chest pain, was confused overnight, --Following commands this morning  Assessment  & Plan :    Active Problems:   Essential hypertension   Mixed hyperlipidemia   Dementia (HCC)   CKD (chronic kidney disease), stage III (HCC)   Hypothyroidism   Acute metabolic encephalopathy   Acute lower UTI   AMS (altered mental status)   AKI (acute kidney injury) ([place])   Acute encephalopathy  Brief Narrative: 85 year old female who is a resident of an assisted living facility, with a history of dementia, admitted to the hospital with acute encephalopathy. She is found to have urinary tract infection and dehydration with acute kidney injury. She was admitted for further treatment with IV fluids and antibiotics.   Assessment & Plan:  Active Problems: Essential hypertension Mixed hyperlipidemia Dementia (HCC) CKD (chronic kidney disease), stage III (HCC) Hypothyroidism Acute metabolic encephalopathy Acute lower UTI AMS (altered mental status) AKI (acute kidney injury) ([place]) Acute encephalopathy   1. Acute metabolic encephalopathy. Likely related to dehydration and Klebsiella urinary tract infection. Patient was reportedly unresponsive at her facility. --She appears back to baseline at this time, she is sitting up in the chair awake, she has cognitive deficits at baseline. There was concern for possible seizure during transport to the hospital. She has not had any repeated episodes. MRI imaging  of the brain was unremarkable as was EEG.     2. Klebsiella UTI. treated with IV ceftriaxone, will discharge on p.o. Keflex per sensitivity report    3. acute kidney injury on chronic kidney disease stage III-creatinine peaked at 1.63, with hydration creatinine is back down to 1.0, AKI has resolved -Decrease IV fluids to KVO   4. Hypothyroidism. Continue on home dose of Synthroid. TSH is 5.368,  Okay to repeat TSH in about 6 weeks  5. Hyperlipidemia. Continue statin  6. Hypertension. Blood pressurestable on Norvasc   Code Status:Full code Family Communication:Discussed with daughter Deborah [name] Standard Rivers-  Disposition Plan:[facility] facility requesting the patient go to SNF rehab prior to coming to the facility to be transitioned to the memory care unit -Patient is medically cleared for discharge awaiting SNF Rehab bed  Consultants:  Neurology  Procedures:  [id number] recorded evidence of a generalized nonspecific cerebral dysfunction (encephalopathy)   Code Status : full   Family Communication:   (patient is alert, awake and coherent) Discussed with daughter Deborah Rivers  DVT Prophylaxis  :  Lovenox  SCDs   Lab Results  Component Value Date   PLT 254 10/13/2018    Inpatient Medications  Scheduled Meds:  amLODipine  5 mg Oral Daily   aspirin EC  81 mg Oral Daily   atorvastatin  20 mg Oral q1800   cephALEXin  250 mg Oral TID   divalproex  125 mg Oral QHS  enoxaparin (LOVENOX) injection  30 mg Subcutaneous Q24H   feeding supplement (ENSURE ENLIVE)  237 mL Oral BID BM   levothyroxine  50 mcg Oral Q0600   multivitamin with minerals  1 tablet Oral Daily   Continuous Infusions:  0.45 % NaCl with KCl 20 mEq / L 100 mL/hr at 10/16/18 0524   PRN Meds:.acetaminophen **OR** acetaminophen, labetalol, LORazepam, ondansetron **OR** ondansetron (ZOFRAN) IV, polyethylene glycol    Anti-infectives (From admission, onward)   Start      Dose/Rate Route Frequency Ordered Stop   10/16/18 1000  cephALEXin (KEFLEX) capsule 250 mg     250 mg Oral 3 times daily 10/15/18 1154 10/18/18 0959   10/15/18 1115  cefTRIAXone (ROCEPHIN) 1 g in sodium chloride 0.9 % 100 mL IVPB  Status:  Discontinued     1 g 200 mL/hr over 30 Minutes Intravenous Every 24 hours 10/15/18 1108 10/15/18 1243   10/15/18 0000  cephALEXin (KEFLEX) 500 MG capsule     500 mg Oral 3 times daily 10/15/18 1129 10/18/18 2359   10/13/18 1500  cefTRIAXone (ROCEPHIN) 1 g in sodium chloride 0.9 % 100 mL IVPB  Status:  Discontinued     1 g 200 mL/hr over 30 Minutes Intravenous Every 24 hours 10/12/18 1952 10/15/18 1108   10/12/18 1530  cefTRIAXone (ROCEPHIN) 1 g in sodium chloride 0.9 % 100 mL IVPB     1 g 200 mL/hr over 30 Minutes Intravenous  Once 10/12/18 1516 10/12/18 1648        Objective:   Vitals:   10/15/18 1429 10/15/18 2129 10/16/18 0620 10/16/18 1442  BP: (!) 150/84 126/65 (!) 159/85 136/69  Pulse:  79 69 75  Resp:  20 20 16   Temp:  98 F (36.7 C) 97.6 F (36.4 C) (!) 96.9 F (36.1 C)  TempSrc:  Oral Oral Axillary  SpO2:  100% 100% 100%  Weight:      Height:        Wt Readings from Last 3 Encounters:  10/12/18 48.4 kg  09/11/18 48.4 kg  08/12/18 51.1 kg     Intake/Output Summary (Last 24 hours) at 10/16/2018 1831 Last data filed at 10/16/2018 1400 Gross per 24 hour  Intake 320 ml  Output --  Net 320 ml   Physical Exam  Gen:- Awake Alert,  In no apparent distress  HEENT:- [place].AT, No sclera icterus Neck-Supple Neck,No JVD,.  Lungs-  CTAB , fair symmetrical air movement CV- S1, S2 normal, regular  Abd-  +ve B.Sounds, Abd Soft, No tenderness,    Extremity/Skin:- No  edema, pedal pulses present  Psych-affect is appropriate, cognitive deficits noted with intermittent episodes of confusion Neuro-generalized weakness without  new focal deficits, no tremors   Data Review:   Micro Results Recent Results (from the past 240 hour(s))  Urine  Culture     Status: Abnormal   Collection Time: 10/12/18  2:20 PM   Specimen: Urine, Clean Catch  Result Value Ref Range Status   Specimen Description   Final    URINE, CLEAN CATCH Performed at [facility], [address] [place], [place] [address]    Special Requests   Final    NONE Performed at [facility], [address] [place], [place] [address]    Culture >=100,000 COLONIES/mL KLEBSIELLA PNEUMONIAE (A)  Final   Report Status 10/15/2018 FINAL  Final   Organism ID, Bacteria KLEBSIELLA PNEUMONIAE (A)  Final      Susceptibility   Klebsiella pneumoniae - MIC*  AMPICILLIN >=32 RESISTANT Resistant     CEFAZOLIN <=4 SENSITIVE Sensitive     CEFTRIAXONE <=1 SENSITIVE Sensitive     CIPROFLOXACIN <=0.25 SENSITIVE Sensitive     GENTAMICIN <=1 SENSITIVE Sensitive     IMIPENEM <=0.25 SENSITIVE Sensitive     NITROFURANTOIN 64 INTERMEDIATE Intermediate     TRIMETH/SULFA <=20 SENSITIVE Sensitive     AMPICILLIN/SULBACTAM <=2 SENSITIVE Sensitive     PIP/TAZO <=4 SENSITIVE Sensitive     Extended ESBL NEGATIVE Sensitive     * >=100,000 COLONIES/mL KLEBSIELLA PNEUMONIAE  SARS Coronavirus 2 [facility] order, Performed in [facility] hospital lab) Nasopharyngeal Nasopharyngeal Swab     Status: None   Collection Time: 10/12/18  4:26 PM   Specimen: Nasopharyngeal Swab  Result Value Ref Range Status   SARS Coronavirus 2 NEGATIVE NEGATIVE Final    Comment: (NOTE) If result is NEGATIVE SARS-CoV-2 target nucleic acids are NOT DETECTED. The SARS-CoV-2 RNA is generally detectable in upper and lower  respiratory specimens during the acute phase of infection. The lowest  concentration of SARS-CoV-2 viral copies this assay can detect is 250  copies / mL. A negative result does not preclude SARS-CoV-2 infection  and should not be used as the sole basis for treatment or other  patient management decisions.  A negative result may occur with  improper specimen collection / handling, submission of  specimen other  than nasopharyngeal swab, presence of viral mutation(s) within the  areas targeted by this assay, and inadequate number of viral copies  (<250 copies / mL). A negative result must be combined with clinical  observations, patient history, and epidemiological information. If result is POSITIVE SARS-CoV-2 target nucleic acids are DETECTED. The SARS-CoV-2 RNA is generally detectable in upper and lower  respiratory specimens dur ing the acute phase of infection.  Positive  results are indicative of active infection with SARS-CoV-2.  Clinical  correlation with patient history and other diagnostic information is  necessary to determine patient infection status.  Positive results do  not rule out bacterial infection or co-infection with other viruses. If result is PRESUMPTIVE POSTIVE SARS-CoV-2 nucleic acids MAY BE PRESENT.   A presumptive positive result was obtained on the submitted specimen  and confirmed on repeat testing.  While 2019 novel coronavirus  (SARS-CoV-2) nucleic acids may be present in the submitted sample  additional confirmatory testing may be necessary for epidemiological  and / or clinical management purposes  to differentiate between  SARS-CoV-2 and other Sarbecovirus currently known to infect humans.  If clinically indicated additional testing with an alternate test  methodology [phone]) is advised. The SARS-CoV-2 RNA is generally  detectable in upper and lower respiratory sp ecimens during the acute  phase of infection. The expected result is Negative. Fact Sheet for Patients:  StrictlyIdeas.no Fact Sheet for Healthcare Providers: BankingDealers.co.za This test is not yet approved or cleared by the [place] FDA and has been authorized for detection and/or diagnosis of SARS-CoV-2 by FDA under an Emergency Use Authorization (EUA).  This EUA will remain in effect (meaning this test can be used) for the  duration of the COVID-19 declaration under Section 564(b)(1) of the Act, 21 U.S.C. section 360bbb-3(b)(1), unless the authorization is terminated or revoked sooner. Performed at [facility], [address] [place], [place] [address]    Radiology Reports Ct Head Wo Contrast  Result Date: 10/12/2018 CLINICAL DATA:  Altered level of consciousness EXAM: CT HEAD WITHOUT CONTRAST TECHNIQUE: Contiguous axial images were obtained from the base of the  skull through the vertex without intravenous contrast. COMPARISON:  08/31/2018 FINDINGS: Brain: There is atrophy and chronic small vessel disease changes. No acute intracranial abnormality. Specifically, no hemorrhage, hydrocephalus, mass lesion, acute infarction, or significant intracranial injury. Vascular: No hyperdense vessel or unexpected calcification. Skull: No acute calvarial abnormality. Sinuses/Orbits: Visualized paranasal sinuses and mastoids clear. Orbital soft tissues unremarkable. Other: None IMPRESSION: Atrophy, chronic microvascular disease. No acute intracranial abnormality. Electronically Signed   By: Rolm Baptise M.D.   On: 10/12/2018 16:08   Mr Brain Wo Contrast  Result Date: 10/13/2018 CLINICAL DATA:  Altered mental status with possible seizure activity EXAM: MRI HEAD WITHOUT CONTRAST TECHNIQUE: Multiplanar, multiecho pulse sequences of the brain and surrounding structures were obtained without intravenous contrast. COMPARISON:  Head CT 07/28/2018 FINDINGS: Truncated study, only diffusion, T2, and sagittal T1 weighted imaging was acquired. The sequences are also significantly motion degraded. Advanced atrophy with ventriculomegaly similar to prior. CSF collection along the left parietal convexity also seen on prior and compatible with small chronic hygroma. No evident infarct, hemorrhage, or mass. Bilateral mastoid opacification greater on the right with negative nasopharynx. IMPRESSION: 1. Truncated and significantly motion degraded study.  2. No acute finding. 3. Advanced brain atrophy with chronic small vessel ischemia. Electronically Signed   By: Monte Fantasia M.D.   On: 10/13/2018 10:27   Dg Chest Port 1 View  Result Date: 10/12/2018 CLINICAL DATA:  Altered mental status EXAM: PORTABLE CHEST 1 VIEW COMPARISON:  07/28/2018 FINDINGS: Heart and mediastinal contours are within normal limits. No focal opacities or effusions. No acute bony abnormality. Aortic atherosclerosis. IMPRESSION: No active disease. Electronically Signed   By: Rolm Baptise M.D.   On: 10/12/2018 17:47   Dg Shoulder Left  Result Date: 10/12/2018 CLINICAL DATA:  Fall, bruising and swelling on left shoulder EXAM: LEFT SHOULDER - 2+ VIEW COMPARISON:  None. FINDINGS: Degenerative changes in the [facility] joint with joint space narrowing and spurring. Glenohumeral joint is maintained. No acute bony abnormality. Specifically, no fracture, subluxation, or dislocation. Soft tissues are intact. IMPRESSION: Degenerative changes in the left AC joint. No acute bony abnormality. Electronically Signed   By: Rolm Baptise M.D.   On: 10/12/2018 17:46   Dg Hip Unilat W Or W/o Pelvis 2-3 Views Left  Result Date: 09/23/2018 CLINICAL DATA:  Left hip pain.  Fall EXAM: DG HIP (WITH OR WITHOUT PELVIS) 2-3V LEFT COMPARISON:  None. FINDINGS: Soft tissue swelling laterally, likely hematoma. Mild degenerative changes in the hip joints bilaterally, symmetric. SI joints symmetric and unremarkable. No acute bony abnormality. Specifically, no fracture, subluxation, or dislocation. IMPRESSION: No acute bony abnormality. Lateral soft tissue prominence, likely related to soft tissue hematoma. Electronically Signed   By: Rolm Baptise M.D.   On: 09/23/2018 19:45     CBC Recent Labs  Lab 10/12/18 1428 10/12/18 1441 10/13/18 0606  WBC 10.2  --  8.1  HGB 11.2* 11.9* 9.9*  HCT 36.6 35.0* 32.6*  PLT 279  --  254  MCV 92.7  --  92.1  MCH 28.4  --  28.0  MCHC 30.6  --  30.4  RDW 14.4  --  14.2    LYMPHSABS 0.9  --   --   MONOABS 0.7  --   --   EOSABS 0.1  --   --   BASOSABS 0.1  --   --     Chemistries  Recent Labs  Lab 10/12/18 1428 10/12/18 1441 10/12/18 1457 10/13/18 0606 10/14/18 0507 10/15/18 0609  NA 143  145  --  145 137 137  K 4.2 4.2  --  3.3* 3.1* 4.3  CL 110 110  --  113* 107 109  CO2 23  --   --  23 22 17*  GLUCOSE 105* 97  --  83 84 82  BUN 39* 35*  --  33* 24* 24*  CREATININE 1.63* 1.60*  --  1.09* 0.89 1.00  CALCIUM 9.4  --   --  8.8* 8.7* 8.5*  MG  --   --  2.3  --   --  2.1  AST 21  --   --   --   --   --   ALT 14  --   --   --   --   --   ALKPHOS 64  --   --   --   --   --   BILITOT 1.0  --   --   --   --   --    ------------------------------------------------------------------------------------------------------------------ No results for input(s): CHOL, HDL, LDLCALC, TRIG, CHOLHDL, LDLDIRECT in the last 72 hours.  No results found for: HGBA1C ------------------------------------------------------------------------------------------------------------------ No results for input(s): TSH, T4TOTAL, T3FREE, THYROIDAB in the last 72 hours.  Invalid input(s): FREET3 ------------------------------------------------------------------------------------------------------------------ No results for input(s): VITAMINB12, FOLATE, FERRITIN, TIBC, IRON, RETICCTPCT in the last 72 hours.  Coagulation profile Recent Labs  Lab 10/12/18 1428  INR 1.1    No results for input(s): DDIMER in the last 72 hours.  Cardiac Enzymes No results for input(s): CKMB, TROPONINI, MYOGLOBIN in the last 168 hours.  Invalid input(s): CK ------------------------------------------------------------------------------------------------------------------ No results found for: BNP   Roxan Hockey M.D on 10/16/2018 at 6:31 PM  Go to [email or web address] - for contact info  [facility] - Office  [phone]

## 2018-10-17 LAB — BASIC METABOLIC PANEL
Anion gap: 8 (ref 5–15)
BUN: 26 mg/dL — ABNORMAL HIGH (ref 8–23)
CO2: 23 mmol/L (ref 22–32)
Calcium: 9.1 mg/dL (ref 8.9–10.3)
Chloride: 110 mmol/L (ref 98–111)
Creatinine, Ser: 0.95 mg/dL (ref 0.44–1.00)
GFR calc Af Amer: >60 mL/min (ref >60)
GFR calc non Af Amer: 52 mL/min — ABNORMAL LOW (ref >60)
Glucose, Bld: 90 mg/dL (ref 70–99)
Potassium: 3.7 mmol/L (ref 3.5–5.1)
Sodium: 141 mmol/L (ref 135–145)

## 2018-10-17 LAB — CBC
HCT: 35.4 % — ABNORMAL LOW (ref 36.0–46.0)
Hemoglobin: 11 g/dL — ABNORMAL LOW (ref 12.0–15.0)
MCH: 28.3 pg (ref 26.0–34.0)
MCHC: 31.1 g/dL (ref 30.0–36.0)
MCV: 91 fL (ref 80.0–100.0)
Platelets: 272 10*3/uL (ref 150–400)
RBC: 3.89 MIL/uL (ref 3.87–5.11)
RDW: 14 % (ref 11.5–15.5)
WBC: 7.8 10*3/uL (ref 4.0–10.5)
nRBC: 0 % (ref 0.0–0.2)

## 2018-10-17 MED ORDER — DIVALPROEX SODIUM 125 MG PO CSDR
125.0000 mg | DELAYED_RELEASE_CAPSULE | Freq: Every day | ORAL | Status: DC
  Administered 2018-10-17 – 2018-10-21 (×5): 125 mg via ORAL
  Filled 2018-10-17 (×5): qty 1

## 2018-10-17 NOTE — Progress Notes (Signed)
Physical Therapy Treatment Patient Details Name: Deborah Rivers MRN: [id number] DOB: 1924/11/23 Today's Date: 10/17/2018    History of Present Illness This patient is a 85 year old female, she is ill-appearing today as she presents with altered mental status.  It is unclear exactly what happened however the patient was last seen earlier this morning sitting in a wheelchair in her room and was later found unresponsive.  The patient is unable to give me any information, she mumbles incoherently.  She does appear to be somnolent but arousable to voice.  Based on the medical record medication chart that accompanies the patient she does take Depakote this is thought to be a mood stabilizer and not for seizures.  She also takes iron, levothyroxine, mag gastral, sertraline, Ativan as needed, trazodone for sleep and milk of magnesia and Maalox for constipation.  The patient does use a diclofenac topical gel for aches and pains.    PT Comments    Patient continues to be confused but very agreeable to therapy. Able to transition to edge of bed but continues to need mod assist with verbal cues and explantion of activities. Seated balance is limited to 5-10 seconds until she starts leaning posteriorly and to the left (even with verbal and tactile cues to use arms to hold herself up). Unable to stand without support, patient verbalizes fear of falling.  Patient will continue to benefit from skilled physical therapy in hospital and recommended venue below to increase strength, balance, endurance for safe ADLs and gait.    Follow Up Recommendations  SNF;Supervision/Assistance - 24 hour;Supervision for mobility/OOB     Equipment Recommendations       Recommendations for Other Services       Precautions / Restrictions Precautions Precautions: Fall Restrictions Weight Bearing Restrictions: No    Mobility  Bed Mobility Overal bed mobility: Needs Assistance Bed Mobility: Rolling;Sidelying to Sit;Supine  to Sit;Sit to Supine Rolling: Mod assist Sidelying to sit: Mod assist Supine to sit: Mod assist Sit to supine: Mod assist   General bed mobility comments: verbal cues throughout  Transfers Overall transfer level: Needs assistance Equipment used: Rolling walker (2 wheeled) Transfers: Sit to/from Stand;Lateral/Scoot Transfers Sit to Stand: Mod assist        Lateral/Scoot Transfers: Mod assist General transfer comment: verbal and tactile cues  Ambulation/Gait                 Stairs             Wheelchair Mobility    Modified Rankin (Stroke Patients Only)       Balance Overall balance assessment: Needs assistance Sitting-balance support: Feet supported;Bilateral upper extremity supported Sitting balance-Leahy Scale: Poor Sitting balance - Comments: able to sit without external assist for 5-10 sec at a time Postural control: Posterior lean;Left lateral lean Standing balance support: Bilateral upper extremity supported Standing balance-Leahy Scale: Poor Standing balance comment: unable to stand up and maintain balance without min assist, posterior lateral lean                            Cognition Arousal/Alertness: Awake/alert Behavior During Therapy: Restless Overall Cognitive Status: Impaired/Different from baseline Area of Impairment: Orientation;Following commands;Safety/judgement;Problem solving;Attention;Memory                 Orientation Level: Disoriented to;Place;Time;Situation Current Attention Level: Selective Memory: Decreased short-term memory Following Commands: Follows one step commands inconsistently;Follows one step commands with increased time Safety/Judgement: Decreased awareness of safety  Problem Solving: Slow processing;Difficulty sequencing;Requires verbal cues;Requires tactile cues General Comments: easily confused      Exercises      General Comments        Pertinent Vitals/Pain Pain Assessment:  No/denies pain    Home Living                      Prior Function            PT Goals (current goals can now be found in the care plan section) Acute Rehab PT Goals PT Goal Formulation: Patient unable to participate in goal setting Progress towards PT goals: Progressing toward goals    Frequency    Min 3X/week      PT Plan Current plan remains appropriate    Co-evaluation              AM-PAC PT "6 Clicks" Mobility   Outcome Measure  Help needed turning from your back to your side while in a flat bed without using bedrails?: A Little Help needed moving from lying on your back to sitting on the side of a flat bed without using bedrails?: A Little Help needed moving to and from a bed to a chair (including a wheelchair)?: A Lot Help needed standing up from a chair using your arms (e.g., wheelchair or bedside chair)?: A Lot Help needed to walk in hospital room?: Total Help needed climbing 3-5 steps with a railing? : Total 6 Click Score: 12    End of Session Equipment Utilized During Treatment: Gait belt Activity Tolerance: Patient limited by fatigue Patient left: in bed;with bed alarm set Nurse Communication: Mobility status PT Visit Diagnosis: Unsteadiness on feet (R26.81);Muscle weakness (generalized) (M62.81);Difficulty in walking, not elsewhere classified (R26.2);Other abnormalities of gait and mobility (R26.89)     Time: [id number] PT Time Calculation (min) (ACUTE ONLY): 23 min  Charges:  $Therapeutic Activity: 23-37 mins                     2:55 PM, 10/17/18 Jerene Pitch, DPT Physical Therapy with [facility]  [phone] office

## 2018-10-17 NOTE — Progress Notes (Signed)
Patient Demographics:    Deborah Rivers, is a 85 y.o. female, DOB - December 06, 1924, [place] date - 10/12/2018   Admitting Physician Ejiroghene Arlyce Dice, MD  Outpatient Primary MD for the patient is Dettinger, Fransisca Kaufmann, MD  LOS - 4   Chief Complaint  Patient presents with  . Altered Mental Status        Subjective:    Deborah Rivers today has no fevers, no emesis,  No chest pain, was confused overnight, --Following commands this morning  Assessment  & Plan :    Active Problems:   Essential hypertension   Mixed hyperlipidemia   Dementia (HCC)   CKD (chronic kidney disease), stage III (HCC)   Hypothyroidism   Acute metabolic encephalopathy   Acute lower UTI   AMS (altered mental status)   AKI (acute kidney injury) ([place])   Acute encephalopathy  Brief Narrative: 85 year old female who is a resident of an assisted living facility, with a history of dementia, admitted to the hospital with acute encephalopathy. She is found to have urinary tract infection and dehydration with acute kidney injury. She was admitted for further treatment with IV fluids and antibiotics. -Awaiting SNF Rehab bed pending level 2 PASSR review    Assessment & Plan:  Active Problems: Essential hypertension Mixed hyperlipidemia Dementia (HCC) CKD (chronic kidney disease), stage III (HCC) Hypothyroidism Acute metabolic encephalopathy Acute lower UTI AMS (altered mental status) AKI (acute kidney injury) ([place]) Acute encephalopathy   1. Acute metabolic encephalopathy- Likely related to dehydration and Klebsiella urinary tract infection. Patient was reportedly unresponsive at her facility.  --Overall mentation is improved, she appears back to baseline at this time. she has cognitive deficits at baseline. There was concern for possible seizure during transport to the hospital.  She has not had any repeated episodes. MRI imaging of the brain was unremarkable as was EEG.     2. Klebsiella UTI. treated with IV ceftriaxone,  switched over to p.o. Keflex per sensitivity report , last dose 10/18/2018   3. acute kidney injury on chronic kidney disease stage III-creatinine peaked at 1.63, with hydration creatinine is back down to 0.9, AKI has resolved -Decreased IV fluids to KVO   4. Hypothyroidism. Continue on home dose of Synthroid. TSH is 5.368,  Okay to repeat TSH in about 6 weeks  5. Hyperlipidemia. Continue statin  6. Hypertension. Blood pressurestable on Norvasc   Code Status:Full code Family Communication:Discussed with daughter Ms [name] Standard Cure-  Disposition Plan:[facility] facility requesting the patient go to SNF rehab prior to coming to the facility to be transitioned to the memory care unit -Patient is medically cleared for discharge . Awaiting SNF Rehab bed pending level 2 PASSR review  Consultants:  Neurology  Procedures:  [id number] recorded evidence of a generalized nonspecific cerebral dysfunction (encephalopathy)   Code Status : full   Family Communication:   (patient is alert, awake and coherent) Discussed with daughter Ms J Cure  DVT Prophylaxis  :  Lovenox  SCDs   Lab Results  Component Value Date   PLT 272 10/17/2018    Inpatient Medications  Scheduled Meds: . amLODipine  5 mg Oral Daily  . aspirin EC  81 mg Oral Daily  . atorvastatin  20 mg Oral q1800  .  cephALEXin  250 mg Oral TID  . divalproex  125 mg Oral q morning - 10a  . divalproex  125 mg Oral QHS  . enoxaparin (LOVENOX) injection  30 mg Subcutaneous Q24H  . feeding supplement (ENSURE ENLIVE)  237 mL Oral BID BM  . levothyroxine  50 mcg Oral Q0600  . multivitamin with minerals  1 tablet Oral Daily  . traZODone  50 mg Oral QHS   Continuous Infusions: . 0.45 % NaCl with KCl 20 mEq / L 20 mL/hr at 10/17/18 1323   PRN  Meds:.acetaminophen **OR** acetaminophen, labetalol, LORazepam, ondansetron **OR** ondansetron (ZOFRAN) IV, polyethylene glycol    Anti-infectives (From admission, onward)   Start     Dose/Rate Route Frequency Ordered Stop   10/16/18 1000  cephALEXin (KEFLEX) capsule 250 mg     250 mg Oral 3 times daily 10/15/18 1154 10/18/18 0959   10/15/18 1115  cefTRIAXone (ROCEPHIN) 1 g in sodium chloride 0.9 % 100 mL IVPB  Status:  Discontinued     1 g 200 mL/hr over 30 Minutes Intravenous Every 24 hours 10/15/18 1108 10/15/18 1243   10/15/18 0000  cephALEXin (KEFLEX) 500 MG capsule     500 mg Oral 3 times daily 10/15/18 1129 10/18/18 2359   10/13/18 1500  cefTRIAXone (ROCEPHIN) 1 g in sodium chloride 0.9 % 100 mL IVPB  Status:  Discontinued     1 g 200 mL/hr over 30 Minutes Intravenous Every 24 hours 10/12/18 1952 10/15/18 1108   10/12/18 1530  cefTRIAXone (ROCEPHIN) 1 g in sodium chloride 0.9 % 100 mL IVPB     1 g 200 mL/hr over 30 Minutes Intravenous  Once 10/12/18 1516 10/12/18 1648        Objective:   Vitals:   10/16/18 0620 10/16/18 1442 10/16/18 2119 10/17/18 0507  BP: (!) 159/85 136/69 (!) 152/62 (!) 151/81  Pulse: 69 75 72 77  Resp: 20 16 18 17   Temp: 97.6 F (36.4 C) (!) 96.9 F (36.1 C) 98.2 F (36.8 C) 98.1 F (36.7 C)  TempSrc: Oral Axillary    SpO2: 100% 100% 99% 100%  Weight:      Height:        Wt Readings from Last 3 Encounters:  10/12/18 48.4 kg  09/11/18 48.4 kg  08/12/18 51.1 kg    No intake or output data in the 24 hours ending 10/17/18 1558 Physical Exam  Gen:- Awake Alert,  In no apparent distress  HEENT:- [place].AT, No sclera icterus Neck-Supple Neck,No JVD,.  Lungs-  CTAB , fair symmetrical air movement CV- S1, S2 normal, regular  Abd-  +ve B.Sounds, Abd Soft, No tenderness,    Extremity/Skin:- No  edema, pedal pulses present  Psych-affect is appropriate, cognitive deficits noted with intermittent episodes of confusion Neuro-generalized weakness  without  new focal deficits, no tremors   Data Review:   Micro Results Recent Results (from the past 240 hour(s))  Urine Culture     Status: Abnormal   Collection Time: 10/12/18  2:20 PM   Specimen: Urine, Clean Catch  Result Value Ref Range Status   Specimen Description   Final    URINE, CLEAN CATCH Performed at [facility], [address] [place], [place] [address]    Special Requests   Final    NONE Performed at [facility], [address] [place], [place] [address]    Culture >=100,000 COLONIES/mL KLEBSIELLA PNEUMONIAE (A)  Final   Report Status 10/15/2018 FINAL  Final   Organism ID,  Bacteria KLEBSIELLA PNEUMONIAE (A)  Final      Susceptibility   Klebsiella pneumoniae - MIC*    AMPICILLIN >=32 RESISTANT Resistant     CEFAZOLIN <=4 SENSITIVE Sensitive     CEFTRIAXONE <=1 SENSITIVE Sensitive     CIPROFLOXACIN <=0.25 SENSITIVE Sensitive     GENTAMICIN <=1 SENSITIVE Sensitive     IMIPENEM <=0.25 SENSITIVE Sensitive     NITROFURANTOIN 64 INTERMEDIATE Intermediate     TRIMETH/SULFA <=20 SENSITIVE Sensitive     AMPICILLIN/SULBACTAM <=2 SENSITIVE Sensitive     PIP/TAZO <=4 SENSITIVE Sensitive     Extended ESBL NEGATIVE Sensitive     * >=100,000 COLONIES/mL KLEBSIELLA PNEUMONIAE  SARS Coronavirus 2 [facility] order, Performed in [facility] hospital lab) Nasopharyngeal Nasopharyngeal Swab     Status: None   Collection Time: 10/12/18  4:26 PM   Specimen: Nasopharyngeal Swab  Result Value Ref Range Status   SARS Coronavirus 2 NEGATIVE NEGATIVE Final    Comment: (NOTE) If result is NEGATIVE SARS-CoV-2 target nucleic acids are NOT DETECTED. The SARS-CoV-2 RNA is generally detectable in upper and lower  respiratory specimens during the acute phase of infection. The lowest  concentration of SARS-CoV-2 viral copies this assay can detect is 250  copies / mL. A negative result does not preclude SARS-CoV-2 infection  and should not be used as the sole basis for treatment or other   patient management decisions.  A negative result may occur with  improper specimen collection / handling, submission of specimen other  than nasopharyngeal swab, presence of viral mutation(s) within the  areas targeted by this assay, and inadequate number of viral copies  (<250 copies / mL). A negative result must be combined with clinical  observations, patient history, and epidemiological information. If result is POSITIVE SARS-CoV-2 target nucleic acids are DETECTED. The SARS-CoV-2 RNA is generally detectable in upper and lower  respiratory specimens dur ing the acute phase of infection.  Positive  results are indicative of active infection with SARS-CoV-2.  Clinical  correlation with patient history and other diagnostic information is  necessary to determine patient infection status.  Positive results do  not rule out bacterial infection or co-infection with other viruses. If result is PRESUMPTIVE POSTIVE SARS-CoV-2 nucleic acids MAY BE PRESENT.   A presumptive positive result was obtained on the submitted specimen  and confirmed on repeat testing.  While 2019 novel coronavirus  (SARS-CoV-2) nucleic acids may be present in the submitted sample  additional confirmatory testing may be necessary for epidemiological  and / or clinical management purposes  to differentiate between  SARS-CoV-2 and other Sarbecovirus currently known to infect humans.  If clinically indicated additional testing with an alternate test  methodology [phone]) is advised. The SARS-CoV-2 RNA is generally  detectable in upper and lower respiratory sp ecimens during the acute  phase of infection. The expected result is Negative. Fact Sheet for Patients:  StrictlyIdeas.no Fact Sheet for Healthcare Providers: BankingDealers.co.za This test is not yet approved or cleared by the [place] FDA and has been authorized for detection and/or diagnosis of SARS-CoV-2 by  FDA under an Emergency Use Authorization (EUA).  This EUA will remain in effect (meaning this test can be used) for the duration of the COVID-19 declaration under Section 564(b)(1) of the Act, 21 U.S.C. section 360bbb-3(b)(1), unless the authorization is terminated or revoked sooner. Performed at [facility], [address], [place], [place] [address]    Radiology Reports Ct Head Wo Contrast  Result Date: 10/12/2018 CLINICAL DATA:  Altered level of consciousness EXAM: CT HEAD WITHOUT CONTRAST TECHNIQUE: Contiguous axial images were obtained from the base of the skull through the vertex without intravenous contrast. COMPARISON:  08/31/2018 FINDINGS: Brain: There is atrophy and chronic small vessel disease changes. No acute intracranial abnormality. Specifically, no hemorrhage, hydrocephalus, mass lesion, acute infarction, or significant intracranial injury. Vascular: No hyperdense vessel or unexpected calcification. Skull: No acute calvarial abnormality. Sinuses/Orbits: Visualized paranasal sinuses and mastoids clear. Orbital soft tissues unremarkable. Other: None IMPRESSION: Atrophy, chronic microvascular disease. No acute intracranial abnormality. Electronically Signed   By: Rolm Baptise M.D.   On: 10/12/2018 16:08   Mr Brain Wo Contrast  Result Date: 10/13/2018 CLINICAL DATA:  Altered mental status with possible seizure activity EXAM: MRI HEAD WITHOUT CONTRAST TECHNIQUE: Multiplanar, multiecho pulse sequences of the brain and surrounding structures were obtained without intravenous contrast. COMPARISON:  Head CT 07/28/2018 FINDINGS: Truncated study, only diffusion, T2, and sagittal T1 weighted imaging was acquired. The sequences are also significantly motion degraded. Advanced atrophy with ventriculomegaly similar to prior. CSF collection along the left parietal convexity also seen on prior and compatible with small chronic hygroma. No evident infarct, hemorrhage, or mass. Bilateral mastoid  opacification greater on the right with negative nasopharynx. IMPRESSION: 1. Truncated and significantly motion degraded study. 2. No acute finding. 3. Advanced brain atrophy with chronic small vessel ischemia. Electronically Signed   By: Monte Fantasia M.D.   On: 10/13/2018 10:27   Dg Chest Port 1 View  Result Date: 10/12/2018 CLINICAL DATA:  Altered mental status EXAM: PORTABLE CHEST 1 VIEW COMPARISON:  07/28/2018 FINDINGS: Heart and mediastinal contours are within normal limits. No focal opacities or effusions. No acute bony abnormality. Aortic atherosclerosis. IMPRESSION: No active disease. Electronically Signed   By: Rolm Baptise M.D.   On: 10/12/2018 17:47   Dg Shoulder Left  Result Date: 10/12/2018 CLINICAL DATA:  Fall, bruising and swelling on left shoulder EXAM: LEFT SHOULDER - 2+ VIEW COMPARISON:  None. FINDINGS: Degenerative changes in the [facility] joint with joint space narrowing and spurring. Glenohumeral joint is maintained. No acute bony abnormality. Specifically, no fracture, subluxation, or dislocation. Soft tissues are intact. IMPRESSION: Degenerative changes in the left AC joint. No acute bony abnormality. Electronically Signed   By: Rolm Baptise M.D.   On: 10/12/2018 17:46   Dg Hip Unilat W Or W/o Pelvis 2-3 Views Left  Result Date: 09/23/2018 CLINICAL DATA:  Left hip pain.  Fall EXAM: DG HIP (WITH OR WITHOUT PELVIS) 2-3V LEFT COMPARISON:  None. FINDINGS: Soft tissue swelling laterally, likely hematoma. Mild degenerative changes in the hip joints bilaterally, symmetric. SI joints symmetric and unremarkable. No acute bony abnormality. Specifically, no fracture, subluxation, or dislocation. IMPRESSION: No acute bony abnormality. Lateral soft tissue prominence, likely related to soft tissue hematoma. Electronically Signed   By: Rolm Baptise M.D.   On: 09/23/2018 19:45     CBC Recent Labs  Lab 10/12/18 1428 10/12/18 1441 10/13/18 0606 10/17/18 0830  WBC 10.2  --  8.1 7.8  HGB 11.2*  11.9* 9.9* 11.0*  HCT 36.6 35.0* 32.6* 35.4*  PLT 279  --  254 272  MCV 92.7  --  92.1 91.0  MCH 28.4  --  28.0 28.3  MCHC 30.6  --  30.4 31.1  RDW 14.4  --  14.2 14.0  LYMPHSABS 0.9  --   --   --   MONOABS 0.7  --   --   --   EOSABS 0.1  --   --   --  BASOSABS 0.1  --   --   --     Chemistries  Recent Labs  Lab 10/12/18 1428 10/12/18 1441 10/12/18 1457 10/13/18 0606 10/14/18 0507 10/15/18 0609 10/17/18 0830  NA 143 145  --  145 137 137 141  K 4.2 4.2  --  3.3* 3.1* 4.3 3.7  CL 110 110  --  113* 107 109 110  CO2 23  --   --  23 22 17* 23  GLUCOSE 105* 97  --  83 84 82 90  BUN 39* 35*  --  33* 24* 24* 26*  CREATININE 1.63* 1.60*  --  1.09* 0.89 1.00 0.95  CALCIUM 9.4  --   --  8.8* 8.7* 8.5* 9.1  MG  --   --  2.3  --   --  2.1  --   AST 21  --   --   --   --   --   --   ALT 14  --   --   --   --   --   --   ALKPHOS 64  --   --   --   --   --   --   BILITOT 1.0  --   --   --   --   --   --    ------------------------------------------------------------------------------------------------------------------ No results for input(s): CHOL, HDL, LDLCALC, TRIG, CHOLHDL, LDLDIRECT in the last 72 hours.  No results found for: HGBA1C ------------------------------------------------------------------------------------------------------------------ No results for input(s): TSH, T4TOTAL, T3FREE, THYROIDAB in the last 72 hours.  Invalid input(s): FREET3 ------------------------------------------------------------------------------------------------------------------ No results for input(s): VITAMINB12, FOLATE, FERRITIN, TIBC, IRON, RETICCTPCT in the last 72 hours.  Coagulation profile Recent Labs  Lab 10/12/18 1428  INR 1.1    No results for input(s): DDIMER in the last 72 hours.  Cardiac Enzymes No results for input(s): CKMB, TROPONINI, MYOGLOBIN in the last 168 hours.  Invalid input(s): CK  ------------------------------------------------------------------------------------------------------------------ No results found for: BNP  Roxan Hockey M.D on 10/17/2018 at 3:58 PM  Go to [email or web address] - for contact info  [facility] - Office  [phone]

## 2018-10-17 NOTE — Care Management Important Message (Signed)
Important Message  Patient Details  Name: Deborah Rivers MRN: [id number] Date of Birth: [birth year]   Medicare Important Message Given:  Yes     Tommy Medal 10/17/2018, 3:34 PM

## 2018-10-18 MED ORDER — ALPRAZOLAM 0.25 MG PO TABS
0.2500 mg | ORAL_TABLET | Freq: Two times a day (BID) | ORAL | Status: DC | PRN
  Administered 2018-10-19 – 2018-10-22 (×4): 0.25 mg via ORAL
  Filled 2018-10-18 (×5): qty 1

## 2018-10-18 NOTE — Progress Notes (Signed)
Patient Demographics:    Lis Pennywell, is a 85 y.o. female, DOB - [birth year], [place] date - 10/12/2018   Admitting Physician Ejiroghene Arlyce Dice, MD  Outpatient Primary MD for the patient is Dettinger, Fransisca Kaufmann, MD  LOS - 5   Chief Complaint  Patient presents with  . Altered Mental Status        Subjective:    Deatra Najar today has no fevers, no emesis,  No chest pain, episodes of disorientation persist,  Assessment  & Plan :    Active Problems:   Essential hypertension   Mixed hyperlipidemia   Dementia (HCC)   CKD (chronic kidney disease), stage III (HCC)   Hypothyroidism   Acute metabolic encephalopathy   Acute lower UTI   AMS (altered mental status)   AKI (acute kidney injury) ([place])   Acute encephalopathy  Brief Narrative: 85 year old female who is a resident of an assisted living facility, with a history of dementia, admitted to the hospital with acute encephalopathy. She is found to have urinary tract infection and dehydration with acute kidney injury. She was admitted for further treatment with IV fluids and antibiotics. -Awaiting SNF Rehab bed pending level 2 PASSR review    Assessment & Plan:  Active Problems: Essential hypertension Mixed hyperlipidemia Dementia (HCC) CKD (chronic kidney disease), stage III (HCC) Hypothyroidism Acute metabolic encephalopathy Acute lower UTI AMS (altered mental status) AKI (acute kidney injury) ([place]) Acute encephalopathy   1. Acute metabolic encephalopathy- Likely related to dehydration and Klebsiella urinary tract infection. Patient was reportedly unresponsive at her facility.  --Overall mentation has improved, she appears back to baseline at this time. she has cognitive deficits at baseline. There was concern for possible seizure during transport to the hospital. She has not had any  repeated episodes. MRI imaging of the brain was unremarkable as was EEG.     2. Klebsiella UTI. treated with IV ceftriaxone,  switched over to p.o. Keflex per sensitivity report , last dose 10/18/2018   3. acute kidney injury on chronic kidney disease stage III-creatinine peaked at 1.63, with hydration creatinine is back down to 0.9, AKI has resolved -Decreased IV fluids to KVO   4. Hypothyroidism. Continue on home dose of Synthroid. TSH is 5.368,  Okay to repeat TSH in about 6 weeks  5. Hyperlipidemia. Continue statin  6. Hypertension. Blood pressurestable on Norvasc   Code Status:Full code Family Communication:Discussed with daughter Ms [name] Standard Cure-  Disposition Plan:[facility] facility requesting the patient go to SNF rehab prior to coming to the facility to be transitioned to the memory care unit -Patient is medically cleared for discharge . Awaiting SNF Rehab bed pending level 2 PASSR review  Consultants:  Neurology  Procedures:  [id number] recorded evidence of a generalized nonspecific cerebral dysfunction (encephalopathy)   Code Status : full   Family Communication:   (patient is alert, awake and coherent) Discussed with daughter Ms J Cure  DVT Prophylaxis  :  Lovenox  SCDs   Lab Results  Component Value Date   PLT 272 10/17/2018    Inpatient Medications  Scheduled Meds: . amLODipine  5 mg Oral Daily  . aspirin EC  81 mg Oral Daily  . atorvastatin  20 mg Oral q1800  . divalproex  125 mg Oral q morning - 10a  . divalproex  125 mg Oral QHS  . enoxaparin (LOVENOX) injection  30 mg Subcutaneous Q24H  . feeding supplement (ENSURE ENLIVE)  237 mL Oral BID BM  . levothyroxine  50 mcg Oral Q0600  . multivitamin with minerals  1 tablet Oral Daily  . traZODone  50 mg Oral QHS   Continuous Infusions: . 0.45 % NaCl with KCl 20 mEq / L Stopped (10/18/18 1532)   PRN Meds:.acetaminophen **OR** acetaminophen, labetalol, ondansetron **OR**  ondansetron (ZOFRAN) IV, polyethylene glycol    Anti-infectives (From admission, onward)   Start     Dose/Rate Route Frequency Ordered Stop   10/16/18 1000  cephALEXin (KEFLEX) capsule 250 mg     250 mg Oral 3 times daily 10/15/18 1154 10/17/18 2111   10/15/18 1115  cefTRIAXone (ROCEPHIN) 1 g in sodium chloride 0.9 % 100 mL IVPB  Status:  Discontinued     1 g 200 mL/hr over 30 Minutes Intravenous Every 24 hours 10/15/18 1108 10/15/18 1243   10/15/18 0000  cephALEXin (KEFLEX) 500 MG capsule     500 mg Oral 3 times daily 10/15/18 1129 10/18/18 2359   10/13/18 1500  cefTRIAXone (ROCEPHIN) 1 g in sodium chloride 0.9 % 100 mL IVPB  Status:  Discontinued     1 g 200 mL/hr over 30 Minutes Intravenous Every 24 hours 10/12/18 1952 10/15/18 1108   10/12/18 1530  cefTRIAXone (ROCEPHIN) 1 g in sodium chloride 0.9 % 100 mL IVPB     1 g 200 mL/hr over 30 Minutes Intravenous  Once 10/12/18 1516 10/12/18 1648        Objective:   Vitals:   10/17/18 1732 10/17/18 2132 10/18/18 0528 10/18/18 1431  BP: 134/74 (!) 151/86 137/70 131/76  Pulse:  87 74 76  Resp:  17 16 16   Temp:  98.3 F (36.8 C) 97.8 F (36.6 C) 98.1 F (36.7 C)  TempSrc:  Oral  Oral  SpO2:  100% 97% 97%  Weight:      Height:        Wt Readings from Last 3 Encounters:  10/12/18 48.4 kg  09/11/18 48.4 kg  08/12/18 51.1 kg     Intake/Output Summary (Last 24 hours) at 10/18/2018 1708 Last data filed at 10/18/2018 1433 Gross per 24 hour  Intake 240 ml  Output 900 ml  Net -660 ml   Physical Exam  Gen:- Awake Alert,  In no apparent distress  HEENT:- [place].AT, No sclera icterus Neck-Supple Neck,No JVD,.  Lungs-  CTAB , fair symmetrical air movement CV- S1, S2 normal, regular  Abd-  +ve B.Sounds, Abd Soft, No tenderness,    Extremity/Skin:- No  edema, pedal pulses present  Psych-affect is appropriate, cognitive deficits noted with intermittent episodes of confusion/disorientation Neuro-generalized weakness without  new  focal deficits, no tremors   Data Review:   Micro Results Recent Results (from the past 240 hour(s))  Urine Culture     Status: Abnormal   Collection Time: 10/12/18  2:20 PM   Specimen: Urine, Clean Catch  Result Value Ref Range Status   Specimen Description   Final    URINE, CLEAN CATCH Performed at [facility], [address], [place], [place] [address]    Special Requests   Final    NONE Performed at [facility], [address], [place], [place] [address]    Culture >=100,000 COLONIES/mL KLEBSIELLA PNEUMONIAE (A)  Final   Report Status 10/15/2018 FINAL  Final  Organism ID, Bacteria KLEBSIELLA PNEUMONIAE (A)  Final      Susceptibility   Klebsiella pneumoniae - MIC*    AMPICILLIN >=32 RESISTANT Resistant     CEFAZOLIN <=4 SENSITIVE Sensitive     CEFTRIAXONE <=1 SENSITIVE Sensitive     CIPROFLOXACIN <=0.25 SENSITIVE Sensitive     GENTAMICIN <=1 SENSITIVE Sensitive     IMIPENEM <=0.25 SENSITIVE Sensitive     NITROFURANTOIN 64 INTERMEDIATE Intermediate     TRIMETH/SULFA <=20 SENSITIVE Sensitive     AMPICILLIN/SULBACTAM <=2 SENSITIVE Sensitive     PIP/TAZO <=4 SENSITIVE Sensitive     Extended ESBL NEGATIVE Sensitive     * >=100,000 COLONIES/mL KLEBSIELLA PNEUMONIAE  SARS Coronavirus 2 [facility] order, Performed in [facility] hospital lab) Nasopharyngeal Nasopharyngeal Swab     Status: None   Collection Time: 10/12/18  4:26 PM   Specimen: Nasopharyngeal Swab  Result Value Ref Range Status   SARS Coronavirus 2 NEGATIVE NEGATIVE Final    Comment: (NOTE) If result is NEGATIVE SARS-CoV-2 target nucleic acids are NOT DETECTED. The SARS-CoV-2 RNA is generally detectable in upper and lower  respiratory specimens during the acute phase of infection. The lowest  concentration of SARS-CoV-2 viral copies this assay can detect is 250  copies / mL. A negative result does not preclude SARS-CoV-2 infection  and should not be used as the sole basis for treatment or other  patient  management decisions.  A negative result may occur with  improper specimen collection / handling, submission of specimen other  than nasopharyngeal swab, presence of viral mutation(s) within the  areas targeted by this assay, and inadequate number of viral copies  (<250 copies / mL). A negative result must be combined with clinical  observations, patient history, and epidemiological information. If result is POSITIVE SARS-CoV-2 target nucleic acids are DETECTED. The SARS-CoV-2 RNA is generally detectable in upper and lower  respiratory specimens dur ing the acute phase of infection.  Positive  results are indicative of active infection with SARS-CoV-2.  Clinical  correlation with patient history and other diagnostic information is  necessary to determine patient infection status.  Positive results do  not rule out bacterial infection or co-infection with other viruses. If result is PRESUMPTIVE POSTIVE SARS-CoV-2 nucleic acids MAY BE PRESENT.   A presumptive positive result was obtained on the submitted specimen  and confirmed on repeat testing.  While 2019 novel coronavirus  (SARS-CoV-2) nucleic acids may be present in the submitted sample  additional confirmatory testing may be necessary for epidemiological  and / or clinical management purposes  to differentiate between  SARS-CoV-2 and other Sarbecovirus currently known to infect humans.  If clinically indicated additional testing with an alternate test  methodology [phone]) is advised. The SARS-CoV-2 RNA is generally  detectable in upper and lower respiratory sp ecimens during the acute  phase of infection. The expected result is Negative. Fact Sheet for Patients:  StrictlyIdeas.no Fact Sheet for Healthcare Providers: BankingDealers.co.za This test is not yet approved or cleared by the [place] FDA and has been authorized for detection and/or diagnosis of SARS-CoV-2 by FDA under  an Emergency Use Authorization (EUA).  This EUA will remain in effect (meaning this test can be used) for the duration of the COVID-19 declaration under Section 564(b)(1) of the Act, 21 U.S.C. section 360bbb-3(b)(1), unless the authorization is terminated or revoked sooner. Performed at [facility], [address], [place], [place] [address]    Radiology Reports Ct Head Wo Contrast  Result Date: 10/12/2018  CLINICAL DATA:  Altered level of consciousness EXAM: CT HEAD WITHOUT CONTRAST TECHNIQUE: Contiguous axial images were obtained from the base of the skull through the vertex without intravenous contrast. COMPARISON:  08/31/2018 FINDINGS: Brain: There is atrophy and chronic small vessel disease changes. No acute intracranial abnormality. Specifically, no hemorrhage, hydrocephalus, mass lesion, acute infarction, or significant intracranial injury. Vascular: No hyperdense vessel or unexpected calcification. Skull: No acute calvarial abnormality. Sinuses/Orbits: Visualized paranasal sinuses and mastoids clear. Orbital soft tissues unremarkable. Other: None IMPRESSION: Atrophy, chronic microvascular disease. No acute intracranial abnormality. Electronically Signed   By: Rolm Baptise M.D.   On: 10/12/2018 16:08   Mr Brain Wo Contrast  Result Date: 10/13/2018 CLINICAL DATA:  Altered mental status with possible seizure activity EXAM: MRI HEAD WITHOUT CONTRAST TECHNIQUE: Multiplanar, multiecho pulse sequences of the brain and surrounding structures were obtained without intravenous contrast. COMPARISON:  Head CT 07/28/2018 FINDINGS: Truncated study, only diffusion, T2, and sagittal T1 weighted imaging was acquired. The sequences are also significantly motion degraded. Advanced atrophy with ventriculomegaly similar to prior. CSF collection along the left parietal convexity also seen on prior and compatible with small chronic hygroma. No evident infarct, hemorrhage, or mass. Bilateral mastoid opacification  greater on the right with negative nasopharynx. IMPRESSION: 1. Truncated and significantly motion degraded study. 2. No acute finding. 3. Advanced brain atrophy with chronic small vessel ischemia. Electronically Signed   By: Monte Fantasia M.D.   On: 10/13/2018 10:27   Dg Chest Port 1 View  Result Date: 10/12/2018 CLINICAL DATA:  Altered mental status EXAM: PORTABLE CHEST 1 VIEW COMPARISON:  07/28/2018 FINDINGS: Heart and mediastinal contours are within normal limits. No focal opacities or effusions. No acute bony abnormality. Aortic atherosclerosis. IMPRESSION: No active disease. Electronically Signed   By: Rolm Baptise M.D.   On: 10/12/2018 17:47   Dg Shoulder Left  Result Date: 10/12/2018 CLINICAL DATA:  Fall, bruising and swelling on left shoulder EXAM: LEFT SHOULDER - 2+ VIEW COMPARISON:  None. FINDINGS: Degenerative changes in the [facility] joint with joint space narrowing and spurring. Glenohumeral joint is maintained. No acute bony abnormality. Specifically, no fracture, subluxation, or dislocation. Soft tissues are intact. IMPRESSION: Degenerative changes in the left AC joint. No acute bony abnormality. Electronically Signed   By: Rolm Baptise M.D.   On: 10/12/2018 17:46   Dg Hip Unilat W Or W/o Pelvis 2-3 Views Left  Result Date: 09/23/2018 CLINICAL DATA:  Left hip pain.  Fall EXAM: DG HIP (WITH OR WITHOUT PELVIS) 2-3V LEFT COMPARISON:  None. FINDINGS: Soft tissue swelling laterally, likely hematoma. Mild degenerative changes in the hip joints bilaterally, symmetric. SI joints symmetric and unremarkable. No acute bony abnormality. Specifically, no fracture, subluxation, or dislocation. IMPRESSION: No acute bony abnormality. Lateral soft tissue prominence, likely related to soft tissue hematoma. Electronically Signed   By: Rolm Baptise M.D.   On: 09/23/2018 19:45     CBC Recent Labs  Lab 10/12/18 1428 10/12/18 1441 10/13/18 0606 10/17/18 0830  WBC 10.2  --  8.1 7.8  HGB 11.2* 11.9* 9.9*  11.0*  HCT 36.6 35.0* 32.6* 35.4*  PLT 279  --  254 272  MCV 92.7  --  92.1 91.0  MCH 28.4  --  28.0 28.3  MCHC 30.6  --  30.4 31.1  RDW 14.4  --  14.2 14.0  LYMPHSABS 0.9  --   --   --   MONOABS 0.7  --   --   --   EOSABS 0.1  --   --   --  BASOSABS 0.1  --   --   --     Chemistries  Recent Labs  Lab 10/12/18 1428 10/12/18 1441 10/12/18 1457 10/13/18 0606 10/14/18 0507 10/15/18 0609 10/17/18 0830  NA 143 145  --  145 137 137 141  K 4.2 4.2  --  3.3* 3.1* 4.3 3.7  CL 110 110  --  113* 107 109 110  CO2 23  --   --  23 22 17* 23  GLUCOSE 105* 97  --  83 84 82 90  BUN 39* 35*  --  33* 24* 24* 26*  CREATININE 1.63* 1.60*  --  1.09* 0.89 1.00 0.95  CALCIUM 9.4  --   --  8.8* 8.7* 8.5* 9.1  MG  --   --  2.3  --   --  2.1  --   AST 21  --   --   --   --   --   --   ALT 14  --   --   --   --   --   --   ALKPHOS 64  --   --   --   --   --   --   BILITOT 1.0  --   --   --   --   --   --    ------------------------------------------------------------------------------------------------------------------ No results for input(s): CHOL, HDL, LDLCALC, TRIG, CHOLHDL, LDLDIRECT in the last 72 hours.  No results found for: HGBA1C ------------------------------------------------------------------------------------------------------------------ No results for input(s): TSH, T4TOTAL, T3FREE, THYROIDAB in the last 72 hours.  Invalid input(s): FREET3 ------------------------------------------------------------------------------------------------------------------ No results for input(s): VITAMINB12, FOLATE, FERRITIN, TIBC, IRON, RETICCTPCT in the last 72 hours.  Coagulation profile Recent Labs  Lab 10/12/18 1428  INR 1.1    No results for input(s): DDIMER in the last 72 hours.  Cardiac Enzymes No results for input(s): CKMB, TROPONINI, MYOGLOBIN in the last 168 hours.  Invalid input(s): CK  ------------------------------------------------------------------------------------------------------------------ No results found for: BNP  Roxan Hockey M.D on 10/18/2018 at 5:08 PM  Go to [email or web address] - for contact info  [facility] - Office  [phone]

## 2018-10-19 MED ORDER — SENNOSIDES-DOCUSATE SODIUM 8.6-50 MG PO TABS
2.0000 | ORAL_TABLET | Freq: Every day | ORAL | Status: DC
  Administered 2018-10-19 – 2018-10-21 (×3): 2 via ORAL
  Filled 2018-10-19 (×3): qty 2

## 2018-10-19 NOTE — Progress Notes (Signed)
Patient Demographics:    Deborah Rivers, is a 85 y.o. female, DOB - 1924-05-06, [place] date - 10/12/2018   Admitting Physician Ejiroghene Arlyce Dice, MD  Outpatient Primary MD for the patient is Dettinger, Fransisca Kaufmann, MD  LOS - 6   Chief Complaint  Patient presents with  . Altered Mental Status        Subjective:    Deborah Rivers today has no fevers, no emesis,  No chest pain, - -somewhat sleepy this a.m.  Assessment  & Plan :    Active Problems:   Essential hypertension   Mixed hyperlipidemia   Dementia (HCC)   CKD (chronic kidney disease), stage III (HCC)   Hypothyroidism   Acute metabolic encephalopathy   Acute lower UTI   AMS (altered mental status)   AKI (acute kidney injury) ([place])   Acute encephalopathy  Brief Narrative: 85 year old female who is a resident of an assisted living facility, with a history of dementia, admitted to the hospital with acute encephalopathy. She is found to have urinary tract infection and dehydration with acute kidney injury. She was admitted for further treatment with IV fluids and antibiotics. -Awaiting SNF Rehab bed pending level 2 PASSR review    Assessment & Plan:  Active Problems: Essential hypertension Mixed hyperlipidemia Dementia (HCC) CKD (chronic kidney disease), stage III (HCC) Hypothyroidism Acute metabolic encephalopathy Acute lower UTI AMS (altered mental status) AKI (acute kidney injury) ([place]) Acute encephalopathy   1. Acute metabolic encephalopathy- Likely related to dehydration and Klebsiella urinary tract infection. Patient was reportedly unresponsive at her facility.  --Overall mentation has improved, she appears back to baseline at this time. she has cognitive deficits at baseline. There was concern for possible seizure during transport to the hospital. She has not had any repeated  episodes. MRI imaging of the brain was unremarkable as was EEG.     2. Klebsiella UTI. treated with IV ceftriaxone,  switched over to p.o. Keflex per sensitivity report , completed last dose 10/18/2018   3. acute kidney injury on chronic kidney disease stage III-creatinine peaked at 1.63, with hydration creatinine is back down to 0.9, AKI has resolved -Decreased IV fluids to KVO   4. Hypothyroidism. Continue on home dose of Synthroid. TSH is 5.368,  Okay to repeat TSH in about 6 weeks  5. Hyperlipidemia. Continue statin  6. Hypertension. Blood pressurestable on Norvasc 5mg  daily   Code Status:Full code Family Communication:Discussed with daughter Ms Waynetta Sandy, -Attempted to reach patient daughter again on 10/19/2018 but her mailbox is full and unable to leave message  Disposition Plan:[facility] facility requesting the patient go to SNF rehab prior to coming to the facility to be transitioned to the memory care unit -Patient is medically cleared for discharge . Awaiting SNF Rehab bed pending level 2 PASSR review  Consultants:  Neurology  Procedures:  [id number] recorded evidence of a generalized nonspecific cerebral dysfunction (encephalopathy)   Code Status : full   Family Communication:   (patient is alert, awake and coherent) Discussed with daughter Ms J Cure  DVT Prophylaxis  :  Lovenox  SCDs   Lab Results  Component Value Date   PLT 272 10/17/2018    Inpatient Medications  Scheduled Meds: . amLODipine  5 mg  Oral Daily  . aspirin EC  81 mg Oral Daily  . atorvastatin  20 mg Oral q1800  . divalproex  125 mg Oral q morning - 10a  . divalproex  125 mg Oral QHS  . enoxaparin (LOVENOX) injection  30 mg Subcutaneous Q24H  . feeding supplement (ENSURE ENLIVE)  237 mL Oral BID BM  . levothyroxine  50 mcg Oral Q0600  . multivitamin with minerals  1 tablet Oral Daily   Continuous Infusions: . 0.45 % NaCl with KCl 20 mEq / L 20  mL/hr at 10/18/18 1835   PRN Meds:.acetaminophen **OR** acetaminophen, ALPRAZolam, labetalol, ondansetron **OR** ondansetron (ZOFRAN) IV, polyethylene glycol   Anti-infectives (From admission, onward)   Start     Dose/Rate Route Frequency Ordered Stop   10/16/18 1000  cephALEXin (KEFLEX) capsule 250 mg     250 mg Oral 3 times daily 10/15/18 1154 10/17/18 2111   10/15/18 1115  cefTRIAXone (ROCEPHIN) 1 g in sodium chloride 0.9 % 100 mL IVPB  Status:  Discontinued     1 g 200 mL/hr over 30 Minutes Intravenous Every 24 hours 10/15/18 1108 10/15/18 1243   10/15/18 0000  cephALEXin (KEFLEX) 500 MG capsule     500 mg Oral 3 times daily 10/15/18 1129 10/18/18 2359   10/13/18 1500  cefTRIAXone (ROCEPHIN) 1 g in sodium chloride 0.9 % 100 mL IVPB  Status:  Discontinued     1 g 200 mL/hr over 30 Minutes Intravenous Every 24 hours 10/12/18 1952 10/15/18 1108   10/12/18 1530  cefTRIAXone (ROCEPHIN) 1 g in sodium chloride 0.9 % 100 mL IVPB     1 g 200 mL/hr over 30 Minutes Intravenous  Once 10/12/18 1516 10/12/18 1648        Objective:   Vitals:   10/18/18 1431 10/18/18 1835 10/18/18 2100 10/19/18 0459  BP: 131/76 (!) 153/75 (!) 167/76 (!) 131/58  Pulse: 76  93 91  Resp: 16  16   Temp: 98.1 F (36.7 C)  98.4 F (36.9 C) 97.7 F (36.5 C)  TempSrc: Oral  Oral Oral  SpO2: 97%  99% 100%  Weight:      Height:        Wt Readings from Last 3 Encounters:  10/12/18 48.4 kg  09/11/18 48.4 kg  08/12/18 51.1 kg     Intake/Output Summary (Last 24 hours) at 10/19/2018 1030 Last data filed at 10/18/2018 1433 Gross per 24 hour  Intake -  Output 200 ml  Net -200 ml   Physical Exam  Gen:- Awake Alert,  In no apparent distress  HEENT:- [place].AT, No sclera icterus Neck-Supple Neck,No JVD,.  Lungs-  CTAB , fair symmetrical air movement CV- S1, S2 normal, regular  Abd-  +ve B.Sounds, Abd Soft, No tenderness,    Extremity/Skin:- No  edema, pedal pulses present  Psych-affect is appropriate,  cognitive deficits noted with intermittent episodes of confusion/disorientation Neuro-generalized weakness without  new focal deficits, no tremors   Data Review:   Micro Results Recent Results (from the past 240 hour(s))  Urine Culture     Status: Abnormal   Collection Time: 10/12/18  2:20 PM   Specimen: Urine, Clean Catch  Result Value Ref Range Status   Specimen Description   Final    URINE, CLEAN CATCH Performed at [facility], [address] [place], [place] [address]    Special Requests   Final    NONE Performed at [facility], [address], [place], [place] [address]  Culture >=100,000 COLONIES/mL KLEBSIELLA PNEUMONIAE (A)  Final   Report Status 10/15/2018 FINAL  Final   Organism ID, Bacteria KLEBSIELLA PNEUMONIAE (A)  Final      Susceptibility   Klebsiella pneumoniae - MIC*    AMPICILLIN >=32 RESISTANT Resistant     CEFAZOLIN <=4 SENSITIVE Sensitive     CEFTRIAXONE <=1 SENSITIVE Sensitive     CIPROFLOXACIN <=0.25 SENSITIVE Sensitive     GENTAMICIN <=1 SENSITIVE Sensitive     IMIPENEM <=0.25 SENSITIVE Sensitive     NITROFURANTOIN 64 INTERMEDIATE Intermediate     TRIMETH/SULFA <=20 SENSITIVE Sensitive     AMPICILLIN/SULBACTAM <=2 SENSITIVE Sensitive     PIP/TAZO <=4 SENSITIVE Sensitive     Extended ESBL NEGATIVE Sensitive     * >=100,000 COLONIES/mL KLEBSIELLA PNEUMONIAE  SARS Coronavirus 2 [facility] order, Performed in [facility] hospital lab) Nasopharyngeal Nasopharyngeal Swab     Status: None   Collection Time: 10/12/18  4:26 PM   Specimen: Nasopharyngeal Swab  Result Value Ref Range Status   SARS Coronavirus 2 NEGATIVE NEGATIVE Final    Comment: (NOTE) If result is NEGATIVE SARS-CoV-2 target nucleic acids are NOT DETECTED. The SARS-CoV-2 RNA is generally detectable in upper and lower  respiratory specimens during the acute phase of infection. The lowest  concentration of SARS-CoV-2 viral copies this assay can detect is 250  copies / mL. A negative result  does not preclude SARS-CoV-2 infection  and should not be used as the sole basis for treatment or other  patient management decisions.  A negative result may occur with  improper specimen collection / handling, submission of specimen other  than nasopharyngeal swab, presence of viral mutation(s) within the  areas targeted by this assay, and inadequate number of viral copies  (<250 copies / mL). A negative result must be combined with clinical  observations, patient history, and epidemiological information. If result is POSITIVE SARS-CoV-2 target nucleic acids are DETECTED. The SARS-CoV-2 RNA is generally detectable in upper and lower  respiratory specimens dur ing the acute phase of infection.  Positive  results are indicative of active infection with SARS-CoV-2.  Clinical  correlation with patient history and other diagnostic information is  necessary to determine patient infection status.  Positive results do  not rule out bacterial infection or co-infection with other viruses. If result is PRESUMPTIVE POSTIVE SARS-CoV-2 nucleic acids MAY BE PRESENT.   A presumptive positive result was obtained on the submitted specimen  and confirmed on repeat testing.  While 2019 novel coronavirus  (SARS-CoV-2) nucleic acids may be present in the submitted sample  additional confirmatory testing may be necessary for epidemiological  and / or clinical management purposes  to differentiate between  SARS-CoV-2 and other Sarbecovirus currently known to infect humans.  If clinically indicated additional testing with an alternate test  methodology [id number]) is advised. The SARS-CoV-2 RNA is generally  detectable in upper and lower respiratory sp ecimens during the acute  phase of infection. The expected result is Negative. Fact Sheet for Patients:  StrictlyIdeas.no Fact Sheet for Healthcare Providers: BankingDealers.co.za This test is not yet approved or  cleared by the [place] FDA and has been authorized for detection and/or diagnosis of SARS-CoV-2 by FDA under an Emergency Use Authorization (EUA).  This EUA will remain in effect (meaning this test can be used) for the duration of the COVID-19 declaration under Section 564(b)(1) of the Act, 21 U.S.C. section 360bbb-3(b)(1), unless the authorization is terminated or revoked sooner. Performed at [facility], [phone]  [address], [place], [place] [address]    Radiology Reports Ct Head Wo Contrast  Result Date: 10/12/2018 CLINICAL DATA:  Altered level of consciousness EXAM: CT HEAD WITHOUT CONTRAST TECHNIQUE: Contiguous axial images were obtained from the base of the skull through the vertex without intravenous contrast. COMPARISON:  08/31/2018 FINDINGS: Brain: There is atrophy and chronic small vessel disease changes. No acute intracranial abnormality. Specifically, no hemorrhage, hydrocephalus, mass lesion, acute infarction, or significant intracranial injury. Vascular: No hyperdense vessel or unexpected calcification. Skull: No acute calvarial abnormality. Sinuses/Orbits: Visualized paranasal sinuses and mastoids clear. Orbital soft tissues unremarkable. Other: None IMPRESSION: Atrophy, chronic microvascular disease. No acute intracranial abnormality. Electronically Signed   By: Rolm Baptise M.D.   On: 10/12/2018 16:08   Mr Brain Wo Contrast  Result Date: 10/13/2018 CLINICAL DATA:  Altered mental status with possible seizure activity EXAM: MRI HEAD WITHOUT CONTRAST TECHNIQUE: Multiplanar, multiecho pulse sequences of the brain and surrounding structures were obtained without intravenous contrast. COMPARISON:  Head CT 07/28/2018 FINDINGS: Truncated study, only diffusion, T2, and sagittal T1 weighted imaging was acquired. The sequences are also significantly motion degraded. Advanced atrophy with ventriculomegaly similar to prior. CSF collection along the left parietal convexity also seen on prior and  compatible with small chronic hygroma. No evident infarct, hemorrhage, or mass. Bilateral mastoid opacification greater on the right with negative nasopharynx. IMPRESSION: 1. Truncated and significantly motion degraded study. 2. No acute finding. 3. Advanced brain atrophy with chronic small vessel ischemia. Electronically Signed   By: Monte Fantasia M.D.   On: 10/13/2018 10:27   Dg Chest Port 1 View  Result Date: 10/12/2018 CLINICAL DATA:  Altered mental status EXAM: PORTABLE CHEST 1 VIEW COMPARISON:  07/28/2018 FINDINGS: Heart and mediastinal contours are within normal limits. No focal opacities or effusions. No acute bony abnormality. Aortic atherosclerosis. IMPRESSION: No active disease. Electronically Signed   By: Rolm Baptise M.D.   On: 10/12/2018 17:47   Dg Shoulder Left  Result Date: 10/12/2018 CLINICAL DATA:  Fall, bruising and swelling on left shoulder EXAM: LEFT SHOULDER - 2+ VIEW COMPARISON:  None. FINDINGS: Degenerative changes in the [facility] joint with joint space narrowing and spurring. Glenohumeral joint is maintained. No acute bony abnormality. Specifically, no fracture, subluxation, or dislocation. Soft tissues are intact. IMPRESSION: Degenerative changes in the left AC joint. No acute bony abnormality. Electronically Signed   By: Rolm Baptise M.D.   On: 10/12/2018 17:46   Dg Hip Unilat W Or W/o Pelvis 2-3 Views Left  Result Date: 09/23/2018 CLINICAL DATA:  Left hip pain.  Fall EXAM: DG HIP (WITH OR WITHOUT PELVIS) 2-3V LEFT COMPARISON:  None. FINDINGS: Soft tissue swelling laterally, likely hematoma. Mild degenerative changes in the hip joints bilaterally, symmetric. SI joints symmetric and unremarkable. No acute bony abnormality. Specifically, no fracture, subluxation, or dislocation. IMPRESSION: No acute bony abnormality. Lateral soft tissue prominence, likely related to soft tissue hematoma. Electronically Signed   By: Rolm Baptise M.D.   On: 09/23/2018 19:45     CBC Recent Labs  Lab  10/12/18 1428 10/12/18 1441 10/13/18 0606 10/17/18 0830  WBC 10.2  --  8.1 7.8  HGB 11.2* 11.9* 9.9* 11.0*  HCT 36.6 35.0* 32.6* 35.4*  PLT 279  --  254 272  MCV 92.7  --  92.1 91.0  MCH 28.4  --  28.0 28.3  MCHC 30.6  --  30.4 31.1  RDW 14.4  --  14.2 14.0  LYMPHSABS 0.9  --   --   --  MONOABS 0.7  --   --   --   EOSABS 0.1  --   --   --   BASOSABS 0.1  --   --   --     Chemistries  Recent Labs  Lab 10/12/18 1428 10/12/18 1441 10/12/18 1457 10/13/18 0606 10/14/18 0507 10/15/18 0609 10/17/18 0830  NA 143 145  --  145 137 137 141  K 4.2 4.2  --  3.3* 3.1* 4.3 3.7  CL 110 110  --  113* 107 109 110  CO2 23  --   --  23 22 17* 23  GLUCOSE 105* 97  --  83 84 82 90  BUN 39* 35*  --  33* 24* 24* 26*  CREATININE 1.63* 1.60*  --  1.09* 0.89 1.00 0.95  CALCIUM 9.4  --   --  8.8* 8.7* 8.5* 9.1  MG  --   --  2.3  --   --  2.1  --   AST 21  --   --   --   --   --   --   ALT 14  --   --   --   --   --   --   ALKPHOS 64  --   --   --   --   --   --   BILITOT 1.0  --   --   --   --   --   --    ------------------------------------------------------------------------------------------------------------------ No results for input(s): CHOL, HDL, LDLCALC, TRIG, CHOLHDL, LDLDIRECT in the last 72 hours.  No results found for: HGBA1C ------------------------------------------------------------------------------------------------------------------ No results for input(s): TSH, T4TOTAL, T3FREE, THYROIDAB in the last 72 hours.  Invalid input(s): FREET3 ------------------------------------------------------------------------------------------------------------------ No results for input(s): VITAMINB12, FOLATE, FERRITIN, TIBC, IRON, RETICCTPCT in the last 72 hours.  Coagulation profile Recent Labs  Lab 10/12/18 1428  INR 1.1    No results for input(s): DDIMER in the last 72 hours.  Cardiac Enzymes No results for input(s): CKMB, TROPONINI, MYOGLOBIN in the last 168 hours.   Invalid input(s): CK ------------------------------------------------------------------------------------------------------------------ No results found for: BNP  Roxan Hockey M.D on 10/19/2018 at 10:30 AM  Go to [email or web address] - for contact info  [facility] - Office  [phone]

## 2018-10-20 LAB — CBC
HCT: 32.9 % — ABNORMAL LOW (ref 36.0–46.0)
Hemoglobin: 10.3 g/dL — ABNORMAL LOW (ref 12.0–15.0)
MCH: 28.3 pg (ref 26.0–34.0)
MCHC: 31.3 g/dL (ref 30.0–36.0)
MCV: 90.4 fL (ref 80.0–100.0)
Platelets: 279 10*3/uL (ref 150–400)
RBC: 3.64 MIL/uL — ABNORMAL LOW (ref 3.87–5.11)
RDW: 14 % (ref 11.5–15.5)
WBC: 7.7 10*3/uL (ref 4.0–10.5)
nRBC: 0 % (ref 0.0–0.2)

## 2018-10-20 MED ORDER — TRAZODONE HCL 50 MG PO TABS
50.0000 mg | ORAL_TABLET | Freq: Every day | ORAL | Status: DC
  Administered 2018-10-20 – 2018-10-21 (×2): 50 mg via ORAL
  Filled 2018-10-20 (×2): qty 1

## 2018-10-20 NOTE — Care Management Important Message (Signed)
Important Message  Patient Details  Name: Deborah Rivers MRN: [id number] Date of Birth: 10/05/1924   Medicare Important Message Given:  Yes     Tommy Medal 10/20/2018, 2:24 PM

## 2018-10-20 NOTE — Progress Notes (Signed)
Patient Demographics:    Deborah Rivers, is a 85 y.o. female, DOB - [birth year], [place] date - 10/12/2018   Admitting Physician Bethena Roys, MD  Outpatient Primary MD for the patient is Dettinger, Fransisca Kaufmann, MD  LOS - 7   Chief Complaint  Patient presents with   Altered Mental Status        Subjective:    Deborah Rivers today has no fevers, no emesis,  No chest pain, - -had a restless night...did not sleep well  Assessment  & Plan :    Active Problems:   Essential hypertension   Mixed hyperlipidemia   Dementia (HCC)   CKD (chronic kidney disease), stage III (HCC)   Hypothyroidism   Acute metabolic encephalopathy   Acute lower UTI   AMS (altered mental status)   AKI (acute kidney injury) ([place])   Acute encephalopathy  Brief Narrative: 85 year old female who is a resident of an assisted living facility, with a history of dementia, admitted to the hospital with acute encephalopathy. She is found to have urinary tract infection and dehydration with acute kidney injury. She was admitted for further treatment with IV fluids and antibiotics. -Awaiting SNF Rehab bed pending level 2 PASSR review  PASSR contact- [phone]   Assessment & Plan:  Active Problems: Essential hypertension Mixed hyperlipidemia Dementia (HCC) CKD (chronic kidney disease), stage III (HCC) Hypothyroidism Acute metabolic encephalopathy Acute lower UTI AMS (altered mental status) AKI (acute kidney injury) ([place]) Acute encephalopathy    1)Acute metabolic encephalopathy- Likely related to dehydration and Klebsiella urinary tract infection. Patient was reportedly unresponsive at her facility.  --Overall mentation has improved, she appears back to baseline at this time. she has cognitive deficits at baseline. There was concern for possible seizure during transport to  the hospital. She has not had any repeated episodes. MRI imaging of the brain was unremarkable as was EEG.     2)Klebsiella UTI-  treated with IV ceftriaxone,  switched over to p.o. Keflex per sensitivity report , completed last dose 10/18/2018   3. acute kidney injury on chronic kidney disease stage III-creatinine peaked at 1.63, with hydration creatinine is back down to 0.9, AKI has resolved -Decreased IV fluids to [facility]  4-Hypothyroidism. Continue on home dose of Synthroid. TSH is 5.368,  Okay to repeat TSH in about 6 weeks  5. Hyperlipidemia- Continue statin  6. Hypertension- Blood pressurestable on Norvasc 5mg  daily   Code Status:Full code Family Communication:Discussed with daughter Ms [name] Standard Cure- again on 10/20/2018  Disposition Plan:[facility] facility requesting the patient go to SNF rehab prior to coming to the facility to be transitioned to the memory care unit -Patient is medically cleared for discharge . Awaiting SNF Rehab bed pending level 2 PASSR review  Consultants:  Neurology  Procedures:  [id number] recorded evidence of a generalized nonspecific cerebral dysfunction (encephalopathy)  Code Status : full   Family Communication:   (patient is alert, awake and coherent) Discussed with daughter Ms J Cure-- Discussed with daughter Ms [name] Standard Cure- again on 10/20/2018   DVT Prophylaxis  :  Lovenox  SCDs   Lab Results  Component Value Date   PLT 279 10/20/2018    Inpatient Medications  Scheduled Meds:  amLODipine  5 mg Oral Daily  aspirin EC  81 mg Oral Daily   atorvastatin  20 mg Oral q1800   divalproex  125 mg Oral q morning - 10a   divalproex  125 mg Oral QHS   enoxaparin (LOVENOX) injection  30 mg Subcutaneous Q24H   feeding supplement (ENSURE ENLIVE)  237 mL Oral BID BM   levothyroxine  50 mcg Oral Q0600   multivitamin with minerals  1 tablet Oral Daily   senna-docusate  2 tablet Oral QHS   traZODone  50 mg Oral  QHS   Continuous Infusions:  0.45 % NaCl with KCl 20 mEq / L 20 mL/hr at 10/18/18 1835   PRN Meds:.acetaminophen **OR** acetaminophen, ALPRAZolam, labetalol, ondansetron **OR** ondansetron (ZOFRAN) IV, polyethylene glycol   Anti-infectives (From admission, onward)   Start     Dose/Rate Route Frequency Ordered Stop   10/16/18 1000  cephALEXin (KEFLEX) capsule 250 mg     250 mg Oral 3 times daily 10/15/18 1154 10/17/18 2111   10/15/18 1115  cefTRIAXone (ROCEPHIN) 1 g in sodium chloride 0.9 % 100 mL IVPB  Status:  Discontinued     1 g 200 mL/hr over 30 Minutes Intravenous Every 24 hours 10/15/18 1108 10/15/18 1243   10/15/18 0000  cephALEXin (KEFLEX) 500 MG capsule     500 mg Oral 3 times daily 10/15/18 1129 10/18/18 2359   10/13/18 1500  cefTRIAXone (ROCEPHIN) 1 g in sodium chloride 0.9 % 100 mL IVPB  Status:  Discontinued     1 g 200 mL/hr over 30 Minutes Intravenous Every 24 hours 10/12/18 1952 10/15/18 1108   10/12/18 1530  cefTRIAXone (ROCEPHIN) 1 g in sodium chloride 0.9 % 100 mL IVPB     1 g 200 mL/hr over 30 Minutes Intravenous  Once 10/12/18 1516 10/12/18 1648        Objective:   Vitals:   10/19/18 2127 10/19/18 2140 10/20/18 0625 10/20/18 1457  BP: (!) 100/53 113/69 106/70 113/68  Pulse: 94 89 75 78  Resp: 18  16 16   Temp: 98.4 F (36.9 C)  97.8 F (36.6 C) 98.5 F (36.9 C)  TempSrc: Oral  Oral Oral  SpO2: 93%  99% 98%  Weight:      Height:        Wt Readings from Last 3 Encounters:  10/12/18 48.4 kg  09/11/18 48.4 kg  08/12/18 51.1 kg    No intake or output data in the 24 hours ending 10/20/18 1830 Physical Exam  Gen:- Awake Alert,  In no apparent distress  HEENT:- [place].AT, No sclera icterus Neck-Supple Neck,No JVD,.  Lungs-  CTAB , fair symmetrical air movement CV- S1, S2 normal, regular  Abd-  +ve B.Sounds, Abd Soft, No tenderness,    Extremity/Skin:- No  edema, pedal pulses present  Psych-affect is appropriate, cognitive deficits noted with  intermittent episodes of confusion/disorientation Neuro-generalized weakness without  new focal deficits, no tremors   Data Review:   Micro Results Recent Results (from the past 240 hour(s))  Urine Culture     Status: Abnormal   Collection Time: 10/12/18  2:20 PM   Specimen: Urine, Clean Catch  Result Value Ref Range Status   Specimen Description   Final    URINE, CLEAN CATCH Performed at [facility], [address] [place], [place] [address]    Special Requests   Final    NONE Performed at [facility], [address], [place], [place] [address]    Culture >=100,000 COLONIES/mL KLEBSIELLA PNEUMONIAE (A)  Final  Report Status 10/15/2018 FINAL  Final   Organism ID, Bacteria KLEBSIELLA PNEUMONIAE (A)  Final      Susceptibility   Klebsiella pneumoniae - MIC*    AMPICILLIN >=32 RESISTANT Resistant     CEFAZOLIN <=4 SENSITIVE Sensitive     CEFTRIAXONE <=1 SENSITIVE Sensitive     CIPROFLOXACIN <=0.25 SENSITIVE Sensitive     GENTAMICIN <=1 SENSITIVE Sensitive     IMIPENEM <=0.25 SENSITIVE Sensitive     NITROFURANTOIN 64 INTERMEDIATE Intermediate     TRIMETH/SULFA <=20 SENSITIVE Sensitive     AMPICILLIN/SULBACTAM <=2 SENSITIVE Sensitive     PIP/TAZO <=4 SENSITIVE Sensitive     Extended ESBL NEGATIVE Sensitive     * >=100,000 COLONIES/mL KLEBSIELLA PNEUMONIAE  SARS Coronavirus 2 [facility] order, Performed in [facility] hospital lab) Nasopharyngeal Nasopharyngeal Swab     Status: None   Collection Time: 10/12/18  4:26 PM   Specimen: Nasopharyngeal Swab  Result Value Ref Range Status   SARS Coronavirus 2 NEGATIVE NEGATIVE Final    Comment: (NOTE) If result is NEGATIVE SARS-CoV-2 target nucleic acids are NOT DETECTED. The SARS-CoV-2 RNA is generally detectable in upper and lower  respiratory specimens during the acute phase of infection. The lowest  concentration of SARS-CoV-2 viral copies this assay can detect is 250  copies / mL. A negative result does not preclude SARS-CoV-2  infection  and should not be used as the sole basis for treatment or other  patient management decisions.  A negative result may occur with  improper specimen collection / handling, submission of specimen other  than nasopharyngeal swab, presence of viral mutation(s) within the  areas targeted by this assay, and inadequate number of viral copies  (<250 copies / mL). A negative result must be combined with clinical  observations, patient history, and epidemiological information. If result is POSITIVE SARS-CoV-2 target nucleic acids are DETECTED. The SARS-CoV-2 RNA is generally detectable in upper and lower  respiratory specimens dur ing the acute phase of infection.  Positive  results are indicative of active infection with SARS-CoV-2.  Clinical  correlation with patient history and other diagnostic information is  necessary to determine patient infection status.  Positive results do  not rule out bacterial infection or co-infection with other viruses. If result is PRESUMPTIVE POSTIVE SARS-CoV-2 nucleic acids MAY BE PRESENT.   A presumptive positive result was obtained on the submitted specimen  and confirmed on repeat testing.  While 2019 novel coronavirus  (SARS-CoV-2) nucleic acids may be present in the submitted sample  additional confirmatory testing may be necessary for epidemiological  and / or clinical management purposes  to differentiate between  SARS-CoV-2 and other Sarbecovirus currently known to infect humans.  If clinically indicated additional testing with an alternate test  methodology [phone]) is advised. The SARS-CoV-2 RNA is generally  detectable in upper and lower respiratory sp ecimens during the acute  phase of infection. The expected result is Negative. Fact Sheet for Patients:  StrictlyIdeas.no Fact Sheet for Healthcare Providers: BankingDealers.co.za This test is not yet approved or cleared by the [place]  FDA and has been authorized for detection and/or diagnosis of SARS-CoV-2 by FDA under an Emergency Use Authorization (EUA).  This EUA will remain in effect (meaning this test can be used) for the duration of the COVID-19 declaration under Section 564(b)(1) of the Act, 21 U.S.C. section 360bbb-3(b)(1), unless the authorization is terminated or revoked sooner. Performed at [facility], [address] [place], [place] [address]    Radiology Reports  Ct Head Wo Contrast  Result Date: 10/12/2018 CLINICAL DATA:  Altered level of consciousness EXAM: CT HEAD WITHOUT CONTRAST TECHNIQUE: Contiguous axial images were obtained from the base of the skull through the vertex without intravenous contrast. COMPARISON:  08/31/2018 FINDINGS: Brain: There is atrophy and chronic small vessel disease changes. No acute intracranial abnormality. Specifically, no hemorrhage, hydrocephalus, mass lesion, acute infarction, or significant intracranial injury. Vascular: No hyperdense vessel or unexpected calcification. Skull: No acute calvarial abnormality. Sinuses/Orbits: Visualized paranasal sinuses and mastoids clear. Orbital soft tissues unremarkable. Other: None IMPRESSION: Atrophy, chronic microvascular disease. No acute intracranial abnormality. Electronically Signed   By: Rolm Baptise M.D.   On: 10/12/2018 16:08   Mr Brain Wo Contrast  Result Date: 10/13/2018 CLINICAL DATA:  Altered mental status with possible seizure activity EXAM: MRI HEAD WITHOUT CONTRAST TECHNIQUE: Multiplanar, multiecho pulse sequences of the brain and surrounding structures were obtained without intravenous contrast. COMPARISON:  Head CT 07/28/2018 FINDINGS: Truncated study, only diffusion, T2, and sagittal T1 weighted imaging was acquired. The sequences are also significantly motion degraded. Advanced atrophy with ventriculomegaly similar to prior. CSF collection along the left parietal convexity also seen on prior and compatible with small  chronic hygroma. No evident infarct, hemorrhage, or mass. Bilateral mastoid opacification greater on the right with negative nasopharynx. IMPRESSION: 1. Truncated and significantly motion degraded study. 2. No acute finding. 3. Advanced brain atrophy with chronic small vessel ischemia. Electronically Signed   By: Monte Fantasia M.D.   On: 10/13/2018 10:27   Dg Chest Port 1 View  Result Date: 10/12/2018 CLINICAL DATA:  Altered mental status EXAM: PORTABLE CHEST 1 VIEW COMPARISON:  07/28/2018 FINDINGS: Heart and mediastinal contours are within normal limits. No focal opacities or effusions. No acute bony abnormality. Aortic atherosclerosis. IMPRESSION: No active disease. Electronically Signed   By: Rolm Baptise M.D.   On: 10/12/2018 17:47   Dg Shoulder Left  Result Date: 10/12/2018 CLINICAL DATA:  Fall, bruising and swelling on left shoulder EXAM: LEFT SHOULDER - 2+ VIEW COMPARISON:  None. FINDINGS: Degenerative changes in the [facility] joint with joint space narrowing and spurring. Glenohumeral joint is maintained. No acute bony abnormality. Specifically, no fracture, subluxation, or dislocation. Soft tissues are intact. IMPRESSION: Degenerative changes in the left AC joint. No acute bony abnormality. Electronically Signed   By: Rolm Baptise M.D.   On: 10/12/2018 17:46   Dg Hip Unilat W Or W/o Pelvis 2-3 Views Left  Result Date: 09/23/2018 CLINICAL DATA:  Left hip pain.  Fall EXAM: DG HIP (WITH OR WITHOUT PELVIS) 2-3V LEFT COMPARISON:  None. FINDINGS: Soft tissue swelling laterally, likely hematoma. Mild degenerative changes in the hip joints bilaterally, symmetric. SI joints symmetric and unremarkable. No acute bony abnormality. Specifically, no fracture, subluxation, or dislocation. IMPRESSION: No acute bony abnormality. Lateral soft tissue prominence, likely related to soft tissue hematoma. Electronically Signed   By: Rolm Baptise M.D.   On: 09/23/2018 19:45     CBC Recent Labs  Lab 10/17/18 0830  10/20/18 0518  WBC 7.8 7.7  HGB 11.0* 10.3*  HCT 35.4* 32.9*  PLT 272 279  MCV 91.0 90.4  MCH 28.3 28.3  MCHC 31.1 31.3  RDW 14.0 14.0    Chemistries  Recent Labs  Lab 10/14/18 0507 10/15/18 0609 10/17/18 0830  NA 137 137 141  K 3.1* 4.3 3.7  CL 107 109 110  CO2 22 17* 23  GLUCOSE 84 82 90  BUN 24* 24* 26*  CREATININE 0.89 1.00 0.95  CALCIUM  8.7* 8.5* 9.1  MG  --  2.1  --    ------------------------------------------------------------------------------------------------------------------ No results for input(s): CHOL, HDL, LDLCALC, TRIG, CHOLHDL, LDLDIRECT in the last 72 hours.  No results found for: HGBA1C ------------------------------------------------------------------------------------------------------------------ No results for input(s): TSH, T4TOTAL, T3FREE, THYROIDAB in the last 72 hours.  Invalid input(s): FREET3 ------------------------------------------------------------------------------------------------------------------ No results for input(s): VITAMINB12, FOLATE, FERRITIN, TIBC, IRON, RETICCTPCT in the last 72 hours.  Coagulation profile No results for input(s): INR, PROTIME in the last 168 hours.  No results for input(s): DDIMER in the last 72 hours.  Cardiac Enzymes No results for input(s): CKMB, TROPONINI, MYOGLOBIN in the last 168 hours.  Invalid input(s): CK ------------------------------------------------------------------------------------------------------------------ No results found for: BNP  Roxan Hockey M.D on 10/20/2018 at 6:30 PM  Go to [email or web address] - for contact info  [facility] - Office  [phone]

## 2018-10-20 NOTE — Progress Notes (Signed)
Patient was awake most of the night. Yelling & crying. Repositioned & reoriented.

## 2018-10-21 NOTE — Progress Notes (Signed)
Patient Demographics:    Deborah Rivers, is a 85 y.o. female, DOB - 1924/03/31, [place] date - 10/12/2018   Admitting Physician Bethena Roys, MD  Outpatient Primary MD for the patient is Dettinger, Fransisca Kaufmann, MD  LOS - 8   Chief Complaint  Patient presents with   Altered Mental Status        Subjective:    [name] today has no fevers, no emesis,  No chest pain, - -sleep disturbance persist  Assessment  & Plan :    Active Problems:   Essential hypertension   Mixed hyperlipidemia   Dementia (HCC)   CKD (chronic kidney disease), stage III (HCC)   Hypothyroidism   Acute metabolic encephalopathy   Acute lower UTI   AMS (altered mental status)   AKI (acute kidney injury) (Deborah Rivers)   Acute encephalopathy  Brief Narrative: 85 year old female who is a resident of an assisted living facility, with a history of dementia, admitted to the hospital with acute encephalopathy. She is found to have urinary tract infection and dehydration with acute kidney injury. She was admitted for further treatment with IV fluids and antibiotics. -Awaiting SNF Rehab bed pending level 2 PASSR review  PASSR contact- Phone [phone]   Assessment & Plan:  Active Problems: Essential hypertension Mixed hyperlipidemia Dementia (HCC) CKD (chronic kidney disease), stage III (HCC) Hypothyroidism Acute metabolic encephalopathy Acute lower UTI AMS (altered mental status) AKI (acute kidney injury) (Deborah Rivers) Acute encephalopathy  A/p 1)Acute metabolic encephalopathy- Likely related to dehydration and Klebsiella urinary tract infection. Patient was reportedly unresponsive at her facility.  --Overall mentation has improved, she appears back to baseline at this time. she has cognitive deficits at baseline. There was concern for possible seizure during transport to the  hospital. She has not had any repeated episodes. MRI imaging of the brain was unremarkable as was EEG.    2)Klebsiella UTI-  treated with IV ceftriaxone,  switched over to p.o. Keflex per sensitivity report , completed last dose 10/18/2018  3)Acute kidney injury on chronic kidney disease stage III-creatinine peaked at 1.63, with hydration creatinine is back down to 0.9, AKI has resolved - dc IVF  4-Hypothyroidism. Continue on home dose of Synthroid. TSH is 5.368,  Okay to repeat TSH in about 6 weeks  5. Hyperlipidemia- Continue statin  6. Hypertension- Blood pressurestable on Norvasc 5mg  daily  Code Status:Full code Family Communication:Discussed with daughter Deborah Rivers Standard Rivers- again on 10/20/2018  Disposition Plan:[facility] requesting the patient go to SNF rehab prior to coming to the facility to be transitioned to the memory care unit -Patient is medically cleared for discharge . Awaiting SNF Rehab bed pending level 2 PASSR review  Consultants:  Neurology  Procedures:  [id number] recorded evidence of a generalized nonspecific cerebral dysfunction (encephalopathy)  Code Status : full   Family Communication:   (patient is alert, awake and coherent) Discussed with daughter Deborah Rivers-- Discussed with daughter Deborah Rivers Standard Rivers- again on 10/20/2018   DVT Prophylaxis  :  Lovenox  SCDs   Lab Results  Component Value Date   PLT 279 10/20/2018    Inpatient Medications  Scheduled Meds:  amLODipine  5 mg Oral Daily   aspirin EC  81 mg Oral Daily  atorvastatin  20 mg Oral q1800   divalproex  125 mg Oral q morning - 10a   divalproex  125 mg Oral QHS   enoxaparin (LOVENOX) injection  30 mg Subcutaneous Q24H   feeding supplement (ENSURE ENLIVE)  237 mL Oral BID BM   levothyroxine  50 mcg Oral Q0600   multivitamin with minerals  1 tablet Oral Daily   senna-docusate  2 tablet Oral QHS   traZODone  50 mg Oral QHS   Continuous  Infusions:  0.45 % NaCl with KCl 20 mEq / L 20 mL/hr at 10/21/18 0103   PRN Meds:.acetaminophen **OR** acetaminophen, ALPRAZolam, labetalol, ondansetron **OR** ondansetron (ZOFRAN) IV, polyethylene glycol   Anti-infectives (From admission, onward)   Start     Dose/Rate Route Frequency Ordered Stop   10/16/18 1000  cephALEXin (KEFLEX) capsule 250 mg     250 mg Oral 3 times daily 10/15/18 1154 10/17/18 2111   10/15/18 1115  cefTRIAXone (ROCEPHIN) 1 g in sodium chloride 0.9 % 100 mL IVPB  Status:  Discontinued     1 g 200 mL/hr over 30 Minutes Intravenous Every 24 hours 10/15/18 1108 10/15/18 1243   10/15/18 0000  cephALEXin (KEFLEX) 500 MG capsule     500 mg Oral 3 times daily 10/15/18 1129 10/18/18 2359   10/13/18 1500  cefTRIAXone (ROCEPHIN) 1 g in sodium chloride 0.9 % 100 mL IVPB  Status:  Discontinued     1 g 200 mL/hr over 30 Minutes Intravenous Every 24 hours 10/12/18 1952 10/15/18 1108   10/12/18 1530  cefTRIAXone (ROCEPHIN) 1 g in sodium chloride 0.9 % 100 mL IVPB     1 g 200 mL/hr over 30 Minutes Intravenous  Once 10/12/18 1516 10/12/18 1648        Objective:   Vitals:   10/20/18 1457 10/21/18 0023 10/21/18 0131 10/21/18 0659  BP: 113/68 (!) 189/79 (!) 165/75 114/63  Pulse: 78 82 80 70  Resp: 16 20  16   Temp: 98.5 F (36.9 C) 97.8 F (36.6 C)  97.7 F (36.5 C)  TempSrc: Oral Oral  Oral  SpO2: 98% 97%  99%  Weight:      Height:        Wt Readings from Last 3 Encounters:  10/12/18 48.4 kg  09/11/18 48.4 kg  08/12/18 51.1 kg    No intake or output data in the 24 hours ending 10/21/18 1347 Physical Exam  Gen:- Awake Alert,  In no apparent distress  HEENT:- Deborah Rivers, No sclera icterus Neck-Supple Neck,No JVD,.  Lungs-  CTAB , fair symmetrical air movement CV- S1, S2 normal, regular  Abd-  +ve B.Sounds, Abd Soft, No tenderness,    Extremity/Skin:- No  edema, pedal pulses present  Psych-affect is appropriate, cognitive deficits noted with intermittent episodes  of confusion/disorientation Neuro-generalized weakness without  new focal deficits, no tremors   Data Review:   Micro Results Recent Results (from the past 240 hour(s))  Urine Culture     Status: Abnormal   Collection Time: 10/12/18  2:20 PM   Specimen: Urine, Clean Catch  Result Value Ref Range Status   Specimen Description   Final    URINE, CLEAN CATCH Performed at [facility], [address], [place], [place] [address]    Special Requests   Final    NONE Performed at [facility], [address] [place], [place] [address]    Culture >=100,000 COLONIES/mL KLEBSIELLA PNEUMONIAE (A)  Final   Report Status 10/15/2018 FINAL  Final  Organism ID, Bacteria KLEBSIELLA PNEUMONIAE (A)  Final      Susceptibility   Klebsiella pneumoniae - MIC*    AMPICILLIN >=32 RESISTANT Resistant     CEFAZOLIN <=4 SENSITIVE Sensitive     CEFTRIAXONE <=1 SENSITIVE Sensitive     CIPROFLOXACIN <=0.25 SENSITIVE Sensitive     GENTAMICIN <=1 SENSITIVE Sensitive     IMIPENEM <=0.25 SENSITIVE Sensitive     NITROFURANTOIN 64 INTERMEDIATE Intermediate     TRIMETH/SULFA <=20 SENSITIVE Sensitive     AMPICILLIN/SULBACTAM <=2 SENSITIVE Sensitive     PIP/TAZO <=4 SENSITIVE Sensitive     Extended ESBL NEGATIVE Sensitive     * >=100,000 COLONIES/mL KLEBSIELLA PNEUMONIAE  SARS Coronavirus 2 [facility] order, Performed in [facility] hospital lab) Nasopharyngeal Nasopharyngeal Swab     Status: None   Collection Time: 10/12/18  4:26 PM   Specimen: Nasopharyngeal Swab  Result Value Ref Range Status   SARS Coronavirus 2 NEGATIVE NEGATIVE Final    Comment: (NOTE) If result is NEGATIVE SARS-CoV-2 target nucleic acids are NOT DETECTED. The SARS-CoV-2 RNA is generally detectable in upper and lower  respiratory specimens during the acute phase of infection. The lowest  concentration of SARS-CoV-2 viral copies this assay can detect is 250  copies / mL. A negative result does not preclude SARS-CoV-2 infection  and should  not be used as the sole basis for treatment or other  patient management decisions.  A negative result may occur with  improper specimen collection / handling, submission of specimen other  than nasopharyngeal swab, presence of viral mutation(s) within the  areas targeted by this assay, and inadequate number of viral copies  (<250 copies / mL). A negative result must be combined with clinical  observations, patient history, and epidemiological information. If result is POSITIVE SARS-CoV-2 target nucleic acids are DETECTED. The SARS-CoV-2 RNA is generally detectable in upper and lower  respiratory specimens dur ing the acute phase of infection.  Positive  results are indicative of active infection with SARS-CoV-2.  Clinical  correlation with patient history and other diagnostic information is  necessary to determine patient infection status.  Positive results do  not rule out bacterial infection or co-infection with other viruses. If result is PRESUMPTIVE POSTIVE SARS-CoV-2 nucleic acids MAY BE PRESENT.   A presumptive positive result was obtained on the submitted specimen  and confirmed on repeat testing.  While 2019 novel coronavirus  (SARS-CoV-2) nucleic acids may be present in the submitted sample  additional confirmatory testing may be necessary for epidemiological  and / or clinical management purposes  to differentiate between  SARS-CoV-2 and other Sarbecovirus currently known to infect humans.  If clinically indicated additional testing with an alternate test  methodology [phone]) is advised. The SARS-CoV-2 RNA is generally  detectable in upper and lower respiratory sp ecimens during the acute  phase of infection. The expected result is Negative. Fact Sheet for Patients:  StrictlyIdeas.no Fact Sheet for Healthcare Providers: BankingDealers.co.za This test is not yet approved or cleared by the [place] FDA and has been  authorized for detection and/or diagnosis of SARS-CoV-2 by FDA under an Emergency Use Authorization (EUA).  This EUA will remain in effect (meaning this test can be used) for the duration of the COVID-19 declaration under Section 564(b)(1) of the Act, 21 U.S.C. section 360bbb-3(b)(1), unless the authorization is terminated or revoked sooner. Performed at [facility], [address], [place], [place] [address]    Radiology Reports Ct Head Wo Contrast  Result Date: 10/12/2018  CLINICAL DATA:  Altered level of consciousness EXAM: CT HEAD WITHOUT CONTRAST TECHNIQUE: Contiguous axial images were obtained from the base of the skull through the vertex without intravenous contrast. COMPARISON:  08/31/2018 FINDINGS: Brain: There is atrophy and chronic small vessel disease changes. No acute intracranial abnormality. Specifically, no hemorrhage, hydrocephalus, mass lesion, acute infarction, or significant intracranial injury. Vascular: No hyperdense vessel or unexpected calcification. Skull: No acute calvarial abnormality. Sinuses/Orbits: Visualized paranasal sinuses and mastoids clear. Orbital soft tissues unremarkable. Other: None IMPRESSION: Atrophy, chronic microvascular disease. No acute intracranial abnormality. Electronically Signed   By: Rolm Baptise M.D.   On: 10/12/2018 16:08   Mr Brain Wo Contrast  Result Date: 10/13/2018 CLINICAL DATA:  Altered mental status with possible seizure activity EXAM: MRI HEAD WITHOUT CONTRAST TECHNIQUE: Multiplanar, multiecho pulse sequences of the brain and surrounding structures were obtained without intravenous contrast. COMPARISON:  Head CT 07/28/2018 FINDINGS: Truncated study, only diffusion, T2, and sagittal T1 weighted imaging was acquired. The sequences are also significantly motion degraded. Advanced atrophy with ventriculomegaly similar to prior. CSF collection along the left parietal convexity also seen on prior and compatible with small chronic hygroma. No  evident infarct, hemorrhage, or mass. Bilateral mastoid opacification greater on the right with negative nasopharynx. IMPRESSION: 1. Truncated and significantly motion degraded study. 2. No acute finding. 3. Advanced brain atrophy with chronic small vessel ischemia. Electronically Signed   By: Monte Fantasia M.D.   On: 10/13/2018 10:27   Dg Chest Port 1 View  Result Date: 10/12/2018 CLINICAL DATA:  Altered mental status EXAM: PORTABLE CHEST 1 VIEW COMPARISON:  07/28/2018 FINDINGS: Heart and mediastinal contours are within normal limits. No focal opacities or effusions. No acute bony abnormality. Aortic atherosclerosis. IMPRESSION: No active disease. Electronically Signed   By: Rolm Baptise M.D.   On: 10/12/2018 17:47   Dg Shoulder Left  Result Date: 10/12/2018 CLINICAL DATA:  Fall, bruising and swelling on left shoulder EXAM: LEFT SHOULDER - 2+ VIEW COMPARISON:  None. FINDINGS: Degenerative changes in the [facility] joint with joint space narrowing and spurring. Glenohumeral joint is maintained. No acute bony abnormality. Specifically, no fracture, subluxation, or dislocation. Soft tissues are intact. IMPRESSION: Degenerative changes in the left AC joint. No acute bony abnormality. Electronically Signed   By: Rolm Baptise M.D.   On: 10/12/2018 17:46   Dg Hip Unilat W Or W/o Pelvis 2-3 Views Left  Result Date: 09/23/2018 CLINICAL DATA:  Left hip pain.  Fall EXAM: DG HIP (WITH OR WITHOUT PELVIS) 2-3V LEFT COMPARISON:  None. FINDINGS: Soft tissue swelling laterally, likely hematoma. Mild degenerative changes in the hip joints bilaterally, symmetric. SI joints symmetric and unremarkable. No acute bony abnormality. Specifically, no fracture, subluxation, or dislocation. IMPRESSION: No acute bony abnormality. Lateral soft tissue prominence, likely related to soft tissue hematoma. Electronically Signed   By: Rolm Baptise M.D.   On: 09/23/2018 19:45     CBC Recent Labs  Lab 10/17/18 0830 10/20/18 0518  WBC 7.8  7.7  HGB 11.0* 10.3*  HCT 35.4* 32.9*  PLT 272 279  MCV 91.0 90.4  MCH 28.3 28.3  MCHC 31.1 31.3  RDW 14.0 14.0    Chemistries  Recent Labs  Lab 10/15/18 0609 10/17/18 0830  NA 137 141  K 4.3 3.7  CL 109 110  CO2 17* 23  GLUCOSE 82 90  BUN 24* 26*  CREATININE 1.00 0.95  CALCIUM 8.5* 9.1  MG 2.1  --    ------------------------------------------------------------------------------------------------------------------ No results for input(s): CHOL, HDL,  LDLCALC, TRIG, CHOLHDL, LDLDIRECT in the last 72 hours.  No results found for: HGBA1C ------------------------------------------------------------------------------------------------------------------ No results for input(s): TSH, T4TOTAL, T3FREE, THYROIDAB in the last 72 hours.  Invalid input(s): FREET3 ------------------------------------------------------------------------------------------------------------------ No results for input(s): VITAMINB12, FOLATE, FERRITIN, TIBC, IRON, RETICCTPCT in the last 72 hours.  Coagulation profile No results for input(s): INR, PROTIME in the last 168 hours.  No results for input(s): DDIMER in the last 72 hours.  Cardiac Enzymes No results for input(s): CKMB, TROPONINI, MYOGLOBIN in the last 168 hours.  Invalid input(s): CK ------------------------------------------------------------------------------------------------------------------ No results found for: BNP  Roxan Hockey M.D on 10/21/2018 at 1:47 PM  Go to [email or web address] - for contact info  [facility] - Office  [phone]

## 2018-10-21 NOTE — Progress Notes (Signed)
Pt has slept for about an hour tonight. Pt now awake, spontaneously yelling and tearful. Pt repositioned in bed and attempted to reorient without success. Call bell within reach. Bed in lowest position. Will continue to monitor.

## 2018-10-21 NOTE — TOC Progression Note (Signed)
Transition of Care [facility]) - Progression Note    Patient Details  Name: Deborah Rivers MRN: [id number] Date of Birth: 23-May-1924  Transition of [facility]) CM/SW Contact  Boneta Lucks, RN Phone Number: 10/21/2018, 5:18 PM  Clinical Narrative:  Spoke with Louis Matte PASSR professional to complete the file on the patient. She was submitting the report and hopes for a Number 10/22/18. Spoke to [facility], they [facility] and can still take patient in on 10/22/18 with restarting authorization.  TOC to follow.       Expected Discharge Plan: Assisted Living Barriers to Discharge: Continued Medical Work up  Expected Discharge Plan and Services Expected Discharge Plan: Assisted Living       Living arrangements for the past 2 months: [place] Expected Discharge Date: 10/15/18                     Readmission Risk Interventions No flowsheet data found.

## 2018-10-22 DIAGNOSIS — Z7401 Bed confinement status: Secondary | ICD-10-CM | POA: Diagnosis not present

## 2018-10-22 DIAGNOSIS — Z79899 Other long term (current) drug therapy: Secondary | ICD-10-CM | POA: Diagnosis not present

## 2018-10-22 DIAGNOSIS — R4182 Altered mental status, unspecified: Secondary | ICD-10-CM | POA: Diagnosis present

## 2018-10-22 DIAGNOSIS — U071 COVID-19: Secondary | ICD-10-CM | POA: Diagnosis not present

## 2018-10-22 DIAGNOSIS — R1032 Left lower quadrant pain: Secondary | ICD-10-CM | POA: Diagnosis not present

## 2018-10-22 DIAGNOSIS — E039 Hypothyroidism, unspecified: Secondary | ICD-10-CM | POA: Diagnosis not present

## 2018-10-22 DIAGNOSIS — I491 Atrial premature depolarization: Secondary | ICD-10-CM | POA: Diagnosis not present

## 2018-10-22 DIAGNOSIS — K219 Gastro-esophageal reflux disease without esophagitis: Secondary | ICD-10-CM | POA: Diagnosis not present

## 2018-10-22 DIAGNOSIS — R29818 Other symptoms and signs involving the nervous system: Secondary | ICD-10-CM | POA: Diagnosis not present

## 2018-10-22 DIAGNOSIS — I10 Essential (primary) hypertension: Secondary | ICD-10-CM | POA: Diagnosis not present

## 2018-10-22 DIAGNOSIS — N183 Chronic kidney disease, stage 3 (moderate): Secondary | ICD-10-CM | POA: Diagnosis not present

## 2018-10-22 DIAGNOSIS — R0902 Hypoxemia: Secondary | ICD-10-CM | POA: Diagnosis not present

## 2018-10-22 DIAGNOSIS — R55 Syncope and collapse: Secondary | ICD-10-CM | POA: Diagnosis not present

## 2018-10-22 DIAGNOSIS — I129 Hypertensive chronic kidney disease with stage 1 through stage 4 chronic kidney disease, or unspecified chronic kidney disease: Secondary | ICD-10-CM | POA: Diagnosis not present

## 2018-10-22 DIAGNOSIS — R404 Transient alteration of awareness: Secondary | ICD-10-CM | POA: Diagnosis not present

## 2018-10-22 DIAGNOSIS — R4020 Unspecified coma: Secondary | ICD-10-CM | POA: Diagnosis not present

## 2018-10-22 DIAGNOSIS — M6281 Muscle weakness (generalized): Secondary | ICD-10-CM | POA: Diagnosis not present

## 2018-10-22 DIAGNOSIS — R4781 Slurred speech: Secondary | ICD-10-CM | POA: Diagnosis not present

## 2018-10-22 DIAGNOSIS — Z7982 Long term (current) use of aspirin: Secondary | ICD-10-CM | POA: Diagnosis not present

## 2018-10-22 DIAGNOSIS — R410 Disorientation, unspecified: Secondary | ICD-10-CM | POA: Diagnosis not present

## 2018-10-22 DIAGNOSIS — R29810 Facial weakness: Secondary | ICD-10-CM | POA: Diagnosis not present

## 2018-10-22 DIAGNOSIS — G9340 Encephalopathy, unspecified: Secondary | ICD-10-CM | POA: Diagnosis not present

## 2018-10-22 DIAGNOSIS — N390 Urinary tract infection, site not specified: Secondary | ICD-10-CM | POA: Diagnosis not present

## 2018-10-22 DIAGNOSIS — B9689 Other specified bacterial agents as the cause of diseases classified elsewhere: Secondary | ICD-10-CM | POA: Diagnosis not present

## 2018-10-22 DIAGNOSIS — R5381 Other malaise: Secondary | ICD-10-CM | POA: Diagnosis not present

## 2018-10-22 DIAGNOSIS — M2550 Pain in unspecified joint: Secondary | ICD-10-CM | POA: Diagnosis not present

## 2018-10-22 DIAGNOSIS — F0390 Unspecified dementia without behavioral disturbance: Secondary | ICD-10-CM | POA: Diagnosis not present

## 2018-10-22 DIAGNOSIS — G9341 Metabolic encephalopathy: Secondary | ICD-10-CM | POA: Diagnosis not present

## 2018-10-22 LAB — CBC
HCT: 31.9 % — ABNORMAL LOW (ref 36.0–46.0)
Hemoglobin: 9.9 g/dL — ABNORMAL LOW (ref 12.0–15.0)
MCH: 28.4 pg (ref 26.0–34.0)
MCHC: 31 g/dL (ref 30.0–36.0)
MCV: 91.4 fL (ref 80.0–100.0)
Platelets: 280 10*3/uL (ref 150–400)
RBC: 3.49 MIL/uL — ABNORMAL LOW (ref 3.87–5.11)
RDW: 14.1 % (ref 11.5–15.5)
WBC: 6.8 10*3/uL (ref 4.0–10.5)
nRBC: 0 % (ref 0.0–0.2)

## 2018-10-22 MED ORDER — ALPRAZOLAM 0.25 MG PO TABS: 0.2500 mg | ORAL_TABLET | Freq: Two times a day (BID) | ORAL | 0 refills | Status: DC | PRN

## 2018-10-22 MED ORDER — ENSURE ENLIVE PO LIQD: 1.0000 | Freq: Two times a day (BID) | ORAL | 12 refills | Status: DC

## 2018-10-22 MED ORDER — TRAZODONE HCL 100 MG PO TABS: 100.0000 mg | ORAL_TABLET | Freq: Every evening | ORAL | 2 refills | Status: AC | PRN

## 2018-10-22 NOTE — Progress Notes (Signed)
PT DISCHARGED TO [facility], REPORT CALLED TO HEATHER LUCAS LPN, IV REMOVED, ANGIO INTACT, VS STABLE, NO EVIDENCE OF DISCOMFORT, PT REMAINS ORIENTED TO PERSON ONLY. CURRENTLY AWAITING [facility] FOR TRANSPORTATION TO FACILITY.

## 2018-10-22 NOTE — Discharge Summary (Signed)
Deborah Rivers, is a 85 y.o. female  DOB 1924/12/20  MRN [id number].  Admission date:  10/12/2018  Admitting Physician  Bethena Roys, MD  Discharge Date:  10/22/2018   Primary MD  Dettinger, Fransisca Kaufmann, MD  Recommendations for primary care physician for things to follow:   1) Depakote that is been changed to 125 mg every morning and nightly--- to avoid excessive daytime sleepiness/drowsiness 2) repeat BMP blood test with PCP in about a week or so 3) okay to transition to memory care unit 4) Ensure/nutritional supplement 1 can twice a day advised 5) continue to use trazodone 100 mg nightly as needed for Sleep  Admission Diagnosis  Seizure ([place]) [R56.9] Acute encephalopathy [G93.40] Acute kidney injury ([place]) [N17.9] Urinary tract infection without hematuria, site unspecified [N39.0] AMS (altered mental status) [R41.82]  Discharge Diagnosis  Seizure ([place]) [R56.9] Acute encephalopathy [G93.40] Acute kidney injury ([place]) [N17.9] Urinary tract infection without hematuria, site unspecified [N39.0] AMS (altered mental status) [R41.82]    Principal Problem:   Acute metabolic encephalopathy Active Problems:   Essential hypertension   Acute lower UTI   Dementia ([place])   CKD (chronic kidney disease), stage III ([place])   Hypothyroidism   Mixed hyperlipidemia   AMS (altered mental status)   AKI (acute kidney injury) ([place])      Past Medical History:  Diagnosis Date  . Allergy   . Anemia   . Anxiety   . Cataract   . Depression   . Diverticulitis   . Essential hypertension   . GERD (gastroesophageal reflux disease)   . Hyperlipidemia   . Hypothyroidism   . Recurrent UTI     Past Surgical History:  Procedure Laterality Date  . APPENDECTOMY    . EYE SURGERY    . HERNIA REPAIR    . TOTAL VAGINAL HYSTERECTOMY         HPI  from the history and physical done on the day of admission:    Chief Complaint: Altered mental status  HPI: Deborah Rivers is a 85 y.o. female with medical history significant for hypertension, CKD 3, depression and anxiety, brought to the ED with reports of unresponsiveness.  History is obtained from chart review, I reached out to the nursing home-unfortunately got a recorded message, I talked to patient's son Robert-listed as patient contact, he has not seen patient in a long while. As at the time of my evaluation patient though awake, is muttering incoherently, repeating the same words over. Patient's was last seen normal sitting in her room in her wheelchair earlier this morning, later she was found unresponsive.  Per paramedics, patient did have some seizure-like activity in route to the hospital when she became stiff.  Stiffness was short-lived and resolved spontaneously.  There was no tongue biting or incontinence.  Patient's son Herbie [place] denies knowing any history of seizures, reports prior episodes of confusion in the setting of UTI, last 02/2018.  ED Course: Blood pressure systolic [id number] to [id number], O2 sats greater than 94% on nasal  cannula, heart rate 70s to 80s, UA with moderate leukocytes greater than 50 WBCs and many bacteria.  WBC 10.2.  Creatinine elevated 1.63.  Reported acid level 29.  Head CT negative for acute abnormality EKG sinus rhythm without significant change from prior.  IV fluid normal saline 150 cc/hr started in ED, IV ceftriaxone given for UTI.  Hospitalist to admit for altered mental status.    Hospital Course:   Brief Narrative: 85 year old female who is a resident of an assisted living facility, with a history of dementia, admitted to the hospital with acute encephalopathy. She is found to have urinary tract infection and dehydration with acute kidney injury. She was admitted for further treatment with IV fluids and antibiotics. Transfer to SNF rehab, may need  memory unit  Assessment & Plan:  Active Problems: Essential  hypertension Mixed hyperlipidemia Dementia (HCC) CKD (chronic kidney disease), stage III (HCC) Hypothyroidism Acute metabolic encephalopathy Acute lower UTI AMS (altered mental status) AKI (acute kidney injury) ([place]) Acute encephalopathy  A/p 1)Acute metabolic encephalopathy- Likely related to dehydration andKlebsiellaurinary tract infection. Patient was reportedly unresponsive at her facility. --Overall mentation has improved, she appears back to baseline at this time. she has cognitive deficits atbaseline. There was concern for possible seizure during transport to the hospital. She has not had any repeated episodes. MRI imaging of the brain was unremarkable as was EEG.  2)KlebsiellaUTI- treated with IVceftriaxone, switched over to p.o. Keflex per sensitivity report , completed last dose 10/18/2018  3)Acute kidney injury on chronic kidney disease stage III-creatinine peaked at 1.63, with hydration creatinine is back down to 0.9, AKI has resolved - dced IVF  4-Hypothyroidism. Continue on home dose of Synthroid. [id number] to repeat TSH in about 6 weeks  5. Hyperlipidemia- Continue statin  6. Hypertension- Blood pressurestable on Norvasc 5mg  daily  7) chronic anemia--hemoglobin remained stable around 10, no evidence of ongoing bleeding  Code Status:Full code Family Communication:Discussed with daughterMs Jeane Cure-   [facility] prior to coming to the facility to be transitioned to the memory care unit -Transfer to SNF rehab, may need  memory unit Consultants:  Neurology  Procedures:  [id number] recorded evidence of a generalized nonspecific cerebral dysfunction (encephalopathy)  Code Status : full   Family Communication:   (patient is alert, awake and coherent) Discussed with daughter Ms J Cure-- Discussed with daughterMs Jeane Cure-    Discharge Condition: Medically stable, cognitive deficits persist which is her baseline  Follow UP  Follow-up Information    [facility]. Go to.   Specialty: Skilled Nursing Facility Contact information: [address] [place] [phone]           Diet and Activity recommendation:  As advised  Discharge Instructions    Discharge Instructions    Call MD for:  difficulty breathing, headache or visual disturbances   Complete by: As directed    Call MD for:  persistant dizziness or light-headedness   Complete by: As directed    Call MD for:  persistant nausea and vomiting   Complete by: As directed    Call MD for:  temperature >100.4   Complete by: As directed    Diet general   Complete by: As directed    Discharge instructions   Complete by: As directed    1) Depakote that is been changed to 125 mg every morning and nightly--- to avoid excessive daytime sleepiness/drowsiness 2) repeat  BMP blood test with PCP in about a week or so 3) okay to transition to memory care unit 4) Ensure/nutritional supplement 1 can twice a day advised 5) continue to use trazodone 100 mg nightly as needed for Sleep   Increase activity slowly   Complete by: As directed         Discharge Medications     Allergies as of 10/22/2018      Reactions   Sulfa Antibiotics Nausea Only, Anxiety, Rash      Medication List    STOP taking these medications   megestrol 20 MG tablet Commonly known as: MEGACE Replaced by: megestrol 400 MG/10ML suspension     TAKE these medications   acetaminophen 325 MG tablet Commonly known as: TYLENOL Take 2 tablets (650 mg total) by mouth every 6 (six) hours as needed for mild pain or headache (or Fever >/= 101).   ALPRAZolam 0.25 MG tablet Commonly known as: XANAX Take 1 tablet (0.25 mg total) by mouth 2 (two) times daily as needed for anxiety.   amLODipine 5 MG tablet  Commonly known as: NORVASC Take 1 tablet (5 mg total) by mouth daily.   aspirin 81 MG tablet Take 1 tablet (81 mg total) by mouth daily with breakfast. What changed: when to take this   atorvastatin 20 MG tablet Commonly known as: LIPITOR Take 1 tablet (20 mg total) by mouth daily at 6 PM.   CENTRUM SILVER PO Take 1 tablet by mouth daily.   diclofenac sodium 1 % Gel Commonly known as: VOLTAREN Apply 2 g topically 4 (four) times daily.   divalproex 125 MG DR tablet Commonly known as: DEPAKOTE Take 1 tablet (125 mg total) by mouth See admin instructions. 1 tablet every morning and 1 tablet nightly  (total of 2 tablets/day) What changed:   when to take this  additional instructions   ergocalciferol 1.25 MG (50000 UT) capsule Commonly known as: VITAMIN D2 Take 50,000 Units by mouth once a week. Mon   feeding supplement (ENSURE ENLIVE) Liqd Take 237 mLs by mouth 2 (two) times daily between meals.   ferrous gluconate 324 MG tablet Commonly known as: FERGON Take 1 tablet (324 mg total) by mouth daily with breakfast.   fluticasone 50 MCG/ACT nasal spray Commonly known as: FLONASE Place 2 sprays into both nostrils daily. What changed:   when to take this  reasons to take this   megestrol 400 MG/10ML suspension Commonly known as: MEGACE Take 10 mLs (400 mg total) by mouth daily. --For appetite stimulation Replaces: megestrol 20 MG tablet   ondansetron 4 MG tablet Commonly known as: ZOFRAN Take 1 tablet (4 mg total) by mouth every 6 (six) hours as needed for nausea.   potassium chloride SA 20 MEQ tablet Commonly known as: K-DUR Take 1 tablet (20 mEq total) by mouth 2 (two) times daily.   senna-docusate 8.6-50 MG tablet Commonly known as: Senokot-S Take 2 tablets by mouth at bedtime.   sertraline 50 MG tablet Commonly known as: ZOLOFT Take 1 tablet (50 mg total) by mouth daily.   Synthroid 50 MCG tablet Generic drug: levothyroxine Take 1 tablet (50 mcg  total) by mouth daily.   traZODone 100 MG tablet Commonly known as: DESYREL Take 1 tablet (100 mg total) by mouth at bedtime as needed for sleep (as Needed for sleep). What changed:   medication strength  how much to take  how to take this  when to take this  reasons to take this  Major procedures and Radiology Reports - PLEASE review detailed and final reports for all details, in brief -   Ct Head Wo Contrast  Result Date: 10/12/2018 CLINICAL DATA:  Altered level of consciousness EXAM: CT HEAD WITHOUT CONTRAST TECHNIQUE: Contiguous axial images were obtained from the base of the skull through the vertex without intravenous contrast. COMPARISON:  08/31/2018 FINDINGS: Brain: There is atrophy and chronic small vessel disease changes. No acute intracranial abnormality. Specifically, no hemorrhage, hydrocephalus, mass lesion, acute infarction, or significant intracranial injury. Vascular: No hyperdense vessel or unexpected calcification. Skull: No acute calvarial abnormality. Sinuses/Orbits: Visualized paranasal sinuses and mastoids clear. Orbital soft tissues unremarkable. Other: None IMPRESSION: Atrophy, chronic microvascular disease. No acute intracranial abnormality. Electronically Signed   By: Rolm Baptise M.D.   On: 10/12/2018 16:08   Mr Brain Wo Contrast  Result Date: 10/13/2018 CLINICAL DATA:  Altered mental status with possible seizure activity EXAM: MRI HEAD WITHOUT CONTRAST TECHNIQUE: Multiplanar, multiecho pulse sequences of the brain and surrounding structures were obtained without intravenous contrast. COMPARISON:  Head CT 07/28/2018 FINDINGS: Truncated study, only diffusion, T2, and sagittal T1 weighted imaging was acquired. The sequences are also significantly motion degraded. Advanced atrophy with ventriculomegaly similar to prior. CSF collection along the left parietal convexity also seen on prior and compatible with small chronic hygroma. No evident infarct,  hemorrhage, or mass. Bilateral mastoid opacification greater on the right with negative nasopharynx. IMPRESSION: 1. Truncated and significantly motion degraded study. 2. No acute finding. 3. Advanced brain atrophy with chronic small vessel ischemia. Electronically Signed   By: Monte Fantasia M.D.   On: 10/13/2018 10:27   Dg Chest Port 1 View  Result Date: 10/12/2018 CLINICAL DATA:  Altered mental status EXAM: PORTABLE CHEST 1 VIEW COMPARISON:  07/28/2018 FINDINGS: Heart and mediastinal contours are within normal limits. No focal opacities or effusions. No acute bony abnormality. Aortic atherosclerosis. IMPRESSION: No active disease. Electronically Signed   By: Rolm Baptise M.D.   On: 10/12/2018 17:47   Dg Shoulder Left  Result Date: 10/12/2018 CLINICAL DATA:  Fall, bruising and swelling on left shoulder EXAM: LEFT SHOULDER - 2+ VIEW COMPARISON:  None. FINDINGS: Degenerative changes in the [facility] joint with joint space narrowing and spurring. Glenohumeral joint is maintained. No acute bony abnormality. Specifically, no fracture, subluxation, or dislocation. Soft tissues are intact. IMPRESSION: Degenerative changes in the left AC joint. No acute bony abnormality. Electronically Signed   By: Rolm Baptise M.D.   On: 10/12/2018 17:46   Dg Hip Unilat W Or W/o Pelvis 2-3 Views Left  Result Date: 09/23/2018 CLINICAL DATA:  Left hip pain.  Fall EXAM: DG HIP (WITH OR WITHOUT PELVIS) 2-3V LEFT COMPARISON:  None. FINDINGS: Soft tissue swelling laterally, likely hematoma. Mild degenerative changes in the hip joints bilaterally, symmetric. SI joints symmetric and unremarkable. No acute bony abnormality. Specifically, no fracture, subluxation, or dislocation. IMPRESSION: No acute bony abnormality. Lateral soft tissue prominence, likely related to soft tissue hematoma. Electronically Signed   By: Rolm Baptise M.D.   On: 09/23/2018 19:45    Micro Results   Recent Results (from the past 240 hour(s))  Urine Culture      Status: Abnormal   Collection Time: 10/12/18  2:20 PM   Specimen: Urine, Clean Catch  Result Value Ref Range Status   Specimen Description   Final    URINE, CLEAN CATCH Performed at [facility], [address] [place], [place] [address]    Special Requests   Final  NONE Performed at [facility], [address], [place], [place] [address]    Culture >=100,000 COLONIES/mL KLEBSIELLA PNEUMONIAE (A)  Final   Report Status 10/15/2018 FINAL  Final   Organism ID, Bacteria KLEBSIELLA PNEUMONIAE (A)  Final      Susceptibility   Klebsiella pneumoniae - MIC*    AMPICILLIN >=32 RESISTANT Resistant     CEFAZOLIN <=4 SENSITIVE Sensitive     CEFTRIAXONE <=1 SENSITIVE Sensitive     CIPROFLOXACIN <=0.25 SENSITIVE Sensitive     GENTAMICIN <=1 SENSITIVE Sensitive     IMIPENEM <=0.25 SENSITIVE Sensitive     NITROFURANTOIN 64 INTERMEDIATE Intermediate     TRIMETH/SULFA <=20 SENSITIVE Sensitive     AMPICILLIN/SULBACTAM <=2 SENSITIVE Sensitive     PIP/TAZO <=4 SENSITIVE Sensitive     Extended ESBL NEGATIVE Sensitive     * >=100,000 COLONIES/mL KLEBSIELLA PNEUMONIAE  SARS Coronavirus 2 [facility] order, Performed in [facility] hospital lab) Nasopharyngeal Nasopharyngeal Swab     Status: None   Collection Time: 10/12/18  4:26 PM   Specimen: Nasopharyngeal Swab  Result Value Ref Range Status   SARS Coronavirus 2 NEGATIVE NEGATIVE Final    Comment: (NOTE) If result is NEGATIVE SARS-CoV-2 target nucleic acids are NOT DETECTED. The SARS-CoV-2 RNA is generally detectable in upper and lower  respiratory specimens during the acute phase of infection. The lowest  concentration of SARS-CoV-2 viral copies this assay can detect is 250  copies / mL. A negative result does not preclude SARS-CoV-2 infection  and should not be used as the sole basis for treatment or other  patient management decisions.  A negative result may occur with  improper specimen collection / handling, submission of specimen other   than nasopharyngeal swab, presence of viral mutation(s) within the  areas targeted by this assay, and inadequate number of viral copies  (<250 copies / mL). A negative result must be combined with clinical  observations, patient history, and epidemiological information. If result is POSITIVE SARS-CoV-2 target nucleic acids are DETECTED. The SARS-CoV-2 RNA is generally detectable in upper and lower  respiratory specimens dur ing the acute phase of infection.  Positive  results are indicative of active infection with SARS-CoV-2.  Clinical  correlation with patient history and other diagnostic information is  necessary to determine patient infection status.  Positive results do  not rule out bacterial infection or co-infection with other viruses. If result is PRESUMPTIVE POSTIVE SARS-CoV-2 nucleic acids MAY BE PRESENT.   A presumptive positive result was obtained on the submitted specimen  and confirmed on repeat testing.  While 2019 novel coronavirus  (SARS-CoV-2) nucleic acids may be present in the submitted sample  additional confirmatory testing may be necessary for epidemiological  and / or clinical management purposes  to differentiate between  SARS-CoV-2 and other Sarbecovirus currently known to infect humans.  If clinically indicated additional testing with an alternate test  methodology [phone]) is advised. The SARS-CoV-2 RNA is generally  detectable in upper and lower respiratory sp ecimens during the acute  phase of infection. The expected result is Negative. Fact Sheet for Patients:  StrictlyIdeas.no Fact Sheet for Healthcare Providers: BankingDealers.co.za This test is not yet approved or cleared by the [place] FDA and has been authorized for detection and/or diagnosis of SARS-CoV-2 by FDA under an Emergency Use Authorization (EUA).  This EUA will remain in effect (meaning this test can be used) for the duration of the  COVID-19 declaration under Section 564(b)(1) of the Act, 21 U.S.C. section  360bbb-3(b)(1), unless the authorization is terminated or revoked sooner. Performed at [facility], [address] [place], [place] [address]        Today   Subjective    Deborah Rivers today has no new complaints, resting comfortably  --At times patient does not sleep well at night, and then sleeps in the early hours of a.m.    Patient has been seen and examined prior to discharge   Objective   Blood pressure (!) 152/64, pulse 77, temperature 98.3 F (36.8 C), temperature source Oral, resp. rate 20, height 5\' 6"  (1.676 m), weight 48.4 kg, SpO2 97 %.   Intake/Output Summary (Last 24 hours) at 10/22/2018 1145 Last data filed at 10/22/2018 0900 Gross per 24 hour  Intake 1792.41 ml  Output -  Net 1792.41 ml    Exam Gen:- Awake Alert,  In no apparent distress  HEENT:- [place].AT, No sclera icterus Neck-Supple Neck,No JVD,.  Lungs-  CTAB , fair symmetrical air movement CV- S1, S2 normal, regular  Abd-  +ve B.Sounds, Abd Soft, No tenderness,    Extremity/Skin:- No  edema, pedal pulses present  Psych-affect is appropriate, cognitive deficits noted with intermittent episodes of confusion/disorientation Neuro-generalized weakness without  new focal deficits, no tremors   Data Review   CBC w Diff:  Lab Results  Component Value Date   WBC 6.8 10/22/2018   HGB 9.9 (L) 10/22/2018   HGB 9.6 (L) 08/12/2018   HCT 31.9 (L) 10/22/2018   HCT 29.4 (L) 08/12/2018   PLT 280 10/22/2018   PLT 280 08/12/2018   LYMPHOPCT 9 10/12/2018   MONOPCT 7 10/12/2018   EOSPCT 1 10/12/2018   BASOPCT 1 10/12/2018    CMP:  Lab Results  Component Value Date   NA 141 10/17/2018   NA 146 (H) 08/12/2018   K 3.7 10/17/2018   CL 110 10/17/2018   CO2 23 10/17/2018   BUN 26 (H) 10/17/2018   BUN 28 08/12/2018   CREATININE 0.95 10/17/2018   PROT 7.0 10/12/2018   PROT 5.9 (L) 04/14/2018   ALBUMIN 3.7 10/12/2018   ALBUMIN  4.0 04/14/2018   BILITOT 1.0 10/12/2018   BILITOT 0.3 04/14/2018   ALKPHOS 64 10/12/2018   AST 21 10/12/2018   ALT 14 10/12/2018  .   Total Discharge time is about 33 minutes  Roxan Hockey M.D on 10/22/2018 at 11:45 AM  Go to [email or web address] -  for contact info  [facility] - Office  [phone]

## 2018-10-22 NOTE — Progress Notes (Signed)
PT LEFT FLOOR VIA STRETCHER WITH BELONGINGS ACCOMPANIED BY EMS STAFF.

## 2018-10-22 NOTE — Discharge Instructions (Signed)
1) Depakote that is been changed to 125 mg every morning and nightly--- to avoid excessive daytime sleepiness/drowsiness 2) repeat BMP blood test with PCP in about a week or so 3) okay to transition to memory care unit 4) Ensure/nutritional supplement 1 can twice a day advised 5) continue to use trazodone 100 mg nightly as needed for Sleep

## 2018-10-22 NOTE — TOC Transition Note (Addendum)
Transition of Care [facility]) - CM/SW Discharge Note   Patient Details  Name: Deborah Rivers MRN: [id number] Date of Birth: [birth year]  [facility] CM/SW Contact:  Boneta Lucks, RN Phone Number: 10/22/2018, 11:54 AM   Clinical Narrative:   PASSR received, Called Elyse Hsu, Ins authorization good through today. Patient is medically ready for discharge. Call Romie Minus  - daughter,  At 75 AM she does not want to pay EMS to transport, she is use to [employer] providing transportation.  She will call them to see if they will transport and let CM know.  Advised unless she worked out something with [facility] and [facility] soon and called back, our plan was as usual to discharge and transport by EMS. Have not heard back. Clinical sent to [facility] side.  Number given to RN to call report.   Addendum : Romie Minus called back, [facility] can not transport due to risk, They advised her to make sure Dementia was on Medical Necessity for EMS.  I personality filled out form and printed to RN on 300, explaining safety, Dementia, AMS. Updated Romie Minus, but with the understanding this was not a promise of payment by insurance. [facility] update with EMS eta.   Final next level of care: Skilled Nursing Facility Barriers to Discharge: Barriers Resolved(waited on Passr number)   Patient Goals and CMS Choice    After SNF to return to [place]    Discharge [place]           Patient to be transferred to facility by: EMS Name of family member notified: Romie Minus - daughter Patient and family notified of of transfer: 10/22/18  Discharge Plan and Services

## 2018-10-22 NOTE — Care Management Important Message (Signed)
Important Message  Patient Details  Name: Deborah Rivers MRN: [id number] Date of Birth: 06/02/1924   Medicare Important Message Given:  Yes     Tommy Medal 10/22/2018, 1:47 PM

## 2018-10-23 ENCOUNTER — Ambulatory Visit: Payer: Medicare Other | Admitting: Family Medicine

## 2018-10-23 DIAGNOSIS — R5381 Other malaise: Secondary | ICD-10-CM | POA: Diagnosis not present

## 2018-10-23 DIAGNOSIS — I10 Essential (primary) hypertension: Secondary | ICD-10-CM | POA: Diagnosis not present

## 2018-10-23 DIAGNOSIS — K219 Gastro-esophageal reflux disease without esophagitis: Secondary | ICD-10-CM | POA: Diagnosis not present

## 2018-10-23 DIAGNOSIS — G9341 Metabolic encephalopathy: Secondary | ICD-10-CM | POA: Diagnosis not present

## 2018-10-29 DIAGNOSIS — N183 Chronic kidney disease, stage 3 (moderate): Secondary | ICD-10-CM | POA: Diagnosis not present

## 2018-10-29 DIAGNOSIS — G9341 Metabolic encephalopathy: Secondary | ICD-10-CM | POA: Diagnosis not present

## 2018-10-29 DIAGNOSIS — B9689 Other specified bacterial agents as the cause of diseases classified elsewhere: Secondary | ICD-10-CM | POA: Diagnosis not present

## 2018-10-29 DIAGNOSIS — N390 Urinary tract infection, site not specified: Secondary | ICD-10-CM | POA: Diagnosis not present

## 2018-10-31 ENCOUNTER — Encounter (HOSPITAL_COMMUNITY): Payer: Self-pay | Admitting: Emergency Medicine

## 2018-10-31 ENCOUNTER — Emergency Department (HOSPITAL_COMMUNITY)
Admission: EM | Admit: 2018-10-31 | Discharge: 2018-10-31 | Disposition: A | Discharge status: Home / Self Care | Payer: Medicare Other | Attending: Emergency Medicine | Admitting: Emergency Medicine

## 2018-10-31 ENCOUNTER — Emergency Department (HOSPITAL_COMMUNITY): Payer: Medicare Other

## 2018-10-31 ENCOUNTER — Other Ambulatory Visit: Payer: Self-pay

## 2018-10-31 DIAGNOSIS — F0390 Unspecified dementia without behavioral disturbance: Secondary | ICD-10-CM | POA: Insufficient documentation

## 2018-10-31 DIAGNOSIS — I129 Hypertensive chronic kidney disease with stage 1 through stage 4 chronic kidney disease, or unspecified chronic kidney disease: Secondary | ICD-10-CM | POA: Insufficient documentation

## 2018-10-31 DIAGNOSIS — R4182 Altered mental status, unspecified: Secondary | ICD-10-CM | POA: Diagnosis not present

## 2018-10-31 DIAGNOSIS — N183 Chronic kidney disease, stage 3 (moderate): Secondary | ICD-10-CM | POA: Insufficient documentation

## 2018-10-31 DIAGNOSIS — E039 Hypothyroidism, unspecified: Secondary | ICD-10-CM | POA: Diagnosis not present

## 2018-10-31 DIAGNOSIS — R55 Syncope and collapse: Secondary | ICD-10-CM | POA: Insufficient documentation

## 2018-10-31 DIAGNOSIS — I10 Essential (primary) hypertension: Secondary | ICD-10-CM | POA: Diagnosis not present

## 2018-10-31 DIAGNOSIS — F0391 Unspecified dementia with behavioral disturbance: Secondary | ICD-10-CM

## 2018-10-31 DIAGNOSIS — Z79899 Other long term (current) drug therapy: Secondary | ICD-10-CM | POA: Insufficient documentation

## 2018-10-31 DIAGNOSIS — Z7982 Long term (current) use of aspirin: Secondary | ICD-10-CM | POA: Diagnosis not present

## 2018-10-31 DIAGNOSIS — R29818 Other symptoms and signs involving the nervous system: Secondary | ICD-10-CM | POA: Diagnosis not present

## 2018-10-31 LAB — DIFFERENTIAL
Abs Immature Granulocytes: 0.05 10*3/uL (ref 0.00–0.07)
Basophils Absolute: 0.1 10*3/uL (ref 0.0–0.1)
Basophils Relative: 1 %
Eosinophils Absolute: 0.2 10*3/uL (ref 0.0–0.5)
Eosinophils Relative: 2 %
Immature Granulocytes: 1 %
Lymphocytes Relative: 17 %
Lymphs Abs: 1.8 10*3/uL (ref 0.7–4.0)
Monocytes Absolute: 0.6 10*3/uL (ref 0.1–1.0)
Monocytes Relative: 5 %
Neutro Abs: 7.8 10*3/uL — ABNORMAL HIGH (ref 1.7–7.7)
Neutrophils Relative %: 74 %

## 2018-10-31 LAB — I-STAT CHEM 8, ED
BUN: 34 mg/dL — ABNORMAL HIGH (ref 8–23)
Calcium, Ion: 1.21 mmol/L (ref 1.15–1.40)
Chloride: 111 mmol/L (ref 98–111)
Creatinine, Ser: 1.3 mg/dL — ABNORMAL HIGH (ref 0.44–1.00)
Glucose, Bld: 112 mg/dL — ABNORMAL HIGH (ref 70–99)
HCT: 33 % — ABNORMAL LOW (ref 36.0–46.0)
Hemoglobin: 11.2 g/dL — ABNORMAL LOW (ref 12.0–15.0)
Potassium: 4.5 mmol/L (ref 3.5–5.1)
Sodium: 141 mmol/L (ref 135–145)
TCO2: 20 mmol/L — ABNORMAL LOW (ref 22–32)

## 2018-10-31 LAB — COMPREHENSIVE METABOLIC PANEL
ALT: 15 U/L (ref 0–44)
AST: 24 U/L (ref 15–41)
Albumin: 3.3 g/dL — ABNORMAL LOW (ref 3.5–5.0)
Alkaline Phosphatase: 52 U/L (ref 38–126)
Anion gap: 9 (ref 5–15)
BUN: 33 mg/dL — ABNORMAL HIGH (ref 8–23)
CO2: 22 mmol/L (ref 22–32)
Calcium: 9.6 mg/dL (ref 8.9–10.3)
Chloride: 113 mmol/L — ABNORMAL HIGH (ref 98–111)
Creatinine, Ser: 1.47 mg/dL — ABNORMAL HIGH (ref 0.44–1.00)
GFR calc Af Amer: 36 mL/min — ABNORMAL LOW (ref >60)
GFR calc non Af Amer: 31 mL/min — ABNORMAL LOW (ref >60)
Glucose, Bld: 117 mg/dL — ABNORMAL HIGH (ref 70–99)
Potassium: 4.6 mmol/L (ref 3.5–5.1)
Sodium: 144 mmol/L (ref 135–145)
Total Bilirubin: 0.5 mg/dL (ref 0.3–1.2)
Total Protein: 5.9 g/dL — ABNORMAL LOW (ref 6.5–8.1)

## 2018-10-31 LAB — RAPID URINE DRUG SCREEN, HOSP PERFORMED
Amphetamines: NOT DETECTED
Barbiturates: NOT DETECTED
Benzodiazepines: POSITIVE — AB
Cocaine: NOT DETECTED
Opiates: NOT DETECTED
Tetrahydrocannabinol: NOT DETECTED

## 2018-10-31 LAB — PROTIME-INR
INR: 1.1 (ref 0.8–1.2)
Prothrombin Time: 13.9 seconds (ref 11.4–15.2)

## 2018-10-31 LAB — CBC
HCT: 34.7 % — ABNORMAL LOW (ref 36.0–46.0)
Hemoglobin: 11 g/dL — ABNORMAL LOW (ref 12.0–15.0)
MCH: 28.7 pg (ref 26.0–34.0)
MCHC: 31.7 g/dL (ref 30.0–36.0)
MCV: 90.6 fL (ref 80.0–100.0)
Platelets: 277 10*3/uL (ref 150–400)
RBC: 3.83 MIL/uL — ABNORMAL LOW (ref 3.87–5.11)
RDW: 14.3 % (ref 11.5–15.5)
WBC: 10.4 10*3/uL (ref 4.0–10.5)
nRBC: 0 % (ref 0.0–0.2)

## 2018-10-31 LAB — URINALYSIS, ROUTINE W REFLEX MICROSCOPIC
Bilirubin Urine: NEGATIVE
Glucose, UA: NEGATIVE mg/dL
Hgb urine dipstick: NEGATIVE
Ketones, ur: NEGATIVE mg/dL
Leukocytes,Ua: NEGATIVE
Nitrite: NEGATIVE
Protein, ur: NEGATIVE mg/dL
Specific Gravity, Urine: 1.016 (ref 1.005–1.030)
pH: 6 (ref 5.0–8.0)

## 2018-10-31 LAB — VALPROIC ACID LEVEL: Valproic Acid Lvl: 30 ug/mL — ABNORMAL LOW (ref 50.0–100.0)

## 2018-10-31 LAB — CBG MONITORING, ED: Glucose-Capillary: 112 mg/dL — ABNORMAL HIGH (ref 70–99)

## 2018-10-31 LAB — ETHANOL: Alcohol, Ethyl (B): <10 mg/dL (ref <10)

## 2018-10-31 LAB — APTT: aPTT: 20 seconds — ABNORMAL LOW (ref 24–36)

## 2018-10-31 MED ORDER — SODIUM CHLORIDE 0.9 % IV BOLUS
500.0000 mL | Freq: Once | INTRAVENOUS | Status: AC
  Administered 2018-10-31: 500 mL via INTRAVENOUS

## 2018-10-31 NOTE — ED Notes (Signed)
Please call pt's daughter POA to give her an update on pt. Deborah Rivers [phone]

## 2018-10-31 NOTE — ED Triage Notes (Signed)
Pt arrives via EMS from [facility] with reports of LSN [id number] and staff found pt apneic and unresponsive. EMS states pt became more responsive en route when starting IVs. Pt alert but not following commands.

## 2018-10-31 NOTE — Code Documentation (Addendum)
85yo female arriving to [facility] via [place] at 1204. Patient from nursing facility where she was LKW wheeling herself in her wheelchair at 1030. Thirty minutes later staff returned and found her unresponsive in her wheelchair. EMS called and activated a code stroke for left gaze and left sided weakness. Stroke team at the bedside on patient arrival. Labs drawn and patient cleared for CT by EDP. Patient to CT with team. CT completed. NIHSS 14, see documentation for details and code stroke times. Patient with nonfocal exam. Patient moans and cries out but does not answer questions or follow commands. No acute stroke treatment at this time. Code stroke canceled. Bedside handoff with ED RN Burman Nieves.

## 2018-10-31 NOTE — ED Notes (Signed)
Help get patient cleaned up patient is resting with nurses at bedside

## 2018-10-31 NOTE — ED Provider Notes (Signed)
[facility] EMERGENCY DEPARTMENT Provider Note   CSN: [id number] Arrival date & time: 10/31/18  1204  An emergency department physician performed an initial assessment on this suspected stroke patient at 1207.  History   Chief Complaint Chief Complaint  Patient presents with  . Altered Mental Status    HPI Deborah Rivers is a 85 y.o. female.     HPI Patient was brought to the emergency department from nursing home facility as a code stroke.  She reportedly had been in her wheelchair and became poorly responsive and her left side was felt to be flaccid. Per EMS: [id number] was the LSN where patient was in wheelchair wheeling herself around. At 1100 when they came back to check on patient she was found apneic. They sternal rubbed her and she took a breath. She appeared to be gazing to the left and her left side was flaccid. EMS was called. She remained unresponsive until EMS loaded her in the ambulance and at that time she began mumbling and moaning.  Spoke with robert ( andy) Koziol her son listed in the chart. Per his report since 8/24 patient has been steadily declining. He reports that on 8/24 patient was unable to carry on a conversation. She also has been unable to walk since then. For the past several months she has been unable to complete her ADLS, and for the past few days she has not been oriented. Past Medical History:  Diagnosis Date  . Allergy   . Anemia   . Anxiety   . Cataract   . Depression   . Diverticulitis   . Essential hypertension   . GERD (gastroesophageal reflux disease)   . Hyperlipidemia   . Hypothyroidism   . Recurrent UTI     Patient Active Problem List   Diagnosis Date Noted  . AKI (acute kidney injury) ([place]) 10/13/2018  . Acute encephalopathy 10/13/2018  . AMS (altered mental status) 10/12/2018  . Acute metabolic encephalopathy [id number]  . Acute lower UTI 03/21/2018  . Hypokalemia 03/21/2018  . Leg cramp 08/10/2017  . Fatigue  08/10/2017  . Hypothyroidism   . Recurrent UTI   . GERD (gastroesophageal reflux disease)   . Depression   . Cataract   . Anxiety   . Abnormal CT scan, colon 12/16/2015  . Mood disorder ([place]) 11/20/2015  . Dementia ([place]) 11/20/2015  . CKD (chronic kidney disease), stage III ([place]) 11/20/2015  . Aortic calcification ([place]) 02/26/2014  . Essential hypertension 02/26/2014  . Mixed hyperlipidemia 02/26/2014  . Anemia 07/20/2013    Past Surgical History:  Procedure Laterality Date  . APPENDECTOMY    . EYE SURGERY    . HERNIA REPAIR    . TOTAL VAGINAL HYSTERECTOMY       OB History   No obstetric history on file.      Home Medications    Prior to Admission medications   Medication Sig Start Date End Date Taking? Authorizing Provider  acetaminophen (TYLENOL) 325 MG tablet Take 2 tablets (650 mg total) by mouth every 6 (six) hours as needed for mild pain or headache (or Fever >/= 101). 10/15/18  Yes Emokpae, Courage, MD  amLODipine (NORVASC) 5 MG tablet Take 1 tablet (5 mg total) by mouth daily. 10/15/18  Yes Roxan Hockey, MD  aspirin 81 MG tablet Take 1 tablet (81 mg total) by mouth daily with breakfast. 10/15/18  Yes Emokpae, Courage, MD  atorvastatin (LIPITOR) 20 MG tablet Take 1 tablet (20 mg total) by mouth  daily at 6 PM. 11/04/17  Yes Dettinger, Fransisca Kaufmann, MD  busPIRone (BUSPAR) 5 MG tablet Take 5 mg by mouth 2 (two) times daily.   Yes [provider]  diclofenac sodium (VOLTAREN) 1 % GEL Apply 2 g topically 4 (four) times daily. 06/12/18  Yes Dettinger, Fransisca Kaufmann, MD  divalproex (DEPAKOTE) 125 MG DR tablet Take 1 tablet (125 mg total) by mouth See admin instructions. 1 tablet every morning and 1 tablet nightly  (total of 2 tablets/day) 10/15/18  Yes Emokpae, Courage, MD  docusate sodium (COLACE) 100 MG capsule Take 100 mg by mouth daily.   Yes [provider]  ergocalciferol (VITAMIN D2) 50000 UNITS capsule Take 50,000 Units by mouth once a week. Mon   Yes  [provider]  ferrous gluconate (FERGON) 324 MG tablet Take 1 tablet (324 mg total) by mouth daily with breakfast. 08/18/18  Yes Dettinger, Fransisca Kaufmann, MD  fluticasone (FLONASE) 50 MCG/ACT nasal spray Place 2 sprays into both nostrils daily. 08/02/17  Yes Timmothy Euler, MD  megestrol (MEGACE) 400 MG/10ML suspension Take 10 mLs (400 mg total) by mouth daily. --For appetite stimulation 10/15/18  Yes Emokpae, Courage, MD  Multiple Vitamins-Minerals (CENTRUM SILVER PO) Take 1 tablet by mouth daily.   Yes [provider]  ondansetron (ZOFRAN) 4 MG tablet Take 1 tablet (4 mg total) by mouth every 6 (six) hours as needed for nausea. 10/15/18  Yes Emokpae, Courage, MD  polyethylene glycol (MIRALAX / GLYCOLAX) 17 g packet Take 17 g by mouth daily as needed for mild constipation.   Yes [provider]  potassium chloride SA (K-DUR) 20 MEQ tablet Take 1 tablet (20 mEq total) by mouth 2 (two) times daily. 07/24/18  Yes Idol, Almyra Free, PA-C  ALPRAZolam Duanne Moron) 0.25 MG tablet Take 1 tablet (0.25 mg total) by mouth 2 (two) times daily as needed for anxiety. 10/22/18   Roxan Hockey, MD  feeding supplement, ENSURE ENLIVE, (ENSURE ENLIVE) LIQD Take 237 mLs by mouth 2 (two) times daily between meals. 10/22/18   Roxan Hockey, MD  senna-docusate (SENOKOT-S) 8.6-50 MG tablet Take 2 tablets by mouth at bedtime. 10/15/18 10/15/19  Roxan Hockey, MD  sertraline (ZOLOFT) 50 MG tablet Take 1 tablet (50 mg total) by mouth daily. 09/11/18   Dettinger, Fransisca Kaufmann, MD  SYNTHROID 50 MCG tablet Take 1 tablet (50 mcg total) by mouth daily. 11/04/17   Dettinger, Fransisca Kaufmann, MD  traZODone (DESYREL) 100 MG tablet Take 1 tablet (100 mg total) by mouth at bedtime as needed for sleep (as Needed for sleep). 10/22/18   Roxan Hockey, MD    Family History Family History  Problem Relation Age of Onset  . Stroke Mother   . Miscarriages / [place] Mother   . Hyperlipidemia Mother   . Hypertension Mother   .  Heart attack Father   . Hyperlipidemia Father   . Hypertension Father   . Hearing loss Father   . Miscarriages / Stillbirths Sister   . Heart attack Maternal Grandmother   . Heart attack Maternal Grandfather   . Heart attack Paternal Grandmother   . Heart attack Paternal Grandfather   . Diabetes Other   . Heart disease Other   . Kidney disease Other   . Heart attack Brother   . Cancer Sister     Social History Social History   Tobacco Use  . Smoking status: Never Smoker  . Smokeless tobacco: Never Used  Substance Use Topics  . Alcohol use: No  Alcohol/week: 0.0 standard drinks  . Drug use: No     Allergies   Sulfa antibiotics   Review of Systems Review of Systems Level 5 caveat cannot obtain review of systems due to patient dementia. Physical Exam Updated Vital Signs BP (!) 146/80   Pulse 87   Temp 97.9 F (36.6 C) (Axillary)   Resp (!) 27   Ht 5\' 6"  (1.676 m)   Wt 48.4 kg   SpO2 100%   BMI 17.22 kg/m   Physical Exam Constitutional:      Comments: On arrival, patient is alert and moving all extremities.  She does not have respiratory distress.  She is verbally crying out and objects to being moved and examined.  HENT:     Head: Normocephalic and atraumatic.     Mouth/Throat:     Mouth: Mucous membranes are moist.     Pharynx: Oropharynx is clear.  Eyes:     Extraocular Movements: Extraocular movements intact.     Pupils: Pupils are equal, round, and reactive to light.  Neck:     Musculoskeletal: Neck supple.  Cardiovascular:     Rate and Rhythm: Normal rate and regular rhythm.  Pulmonary:     Effort: Pulmonary effort is normal.     Breath sounds: Normal breath sounds.  Abdominal:     General: There is no distension.     Palpations: Abdomen is soft.     Tenderness: There is no abdominal tenderness. There is no guarding.  Musculoskeletal: Normal range of motion.        General: No swelling or tenderness.     Right lower leg: No edema.     Left  lower leg: No edema.     Comments: Patient's extremities are symmetric.  She does not have peripheral edema.  The condition of the extremities is quite good.  Thin with muscular atrophy but no wounds or contractures.  Skin:    General: Skin is warm and dry.  Neurological:     Comments: Patient is alert.  She has good eye contact.  She will follow some commands for doing grip strength and holding her legs up off the bed.  She does have a fairly constant stream of vocalizations that are very difficult to understand.  She makes some ululating sounds mixed with what sounds like close to normal speech.      ED Treatments / Results  Labs (all labs ordered are listed, but only abnormal results are displayed) Labs Reviewed  APTT - Abnormal; Notable for the following components:      Result Value   aPTT 20 (*)    All other components within normal limits  CBC - Abnormal; Notable for the following components:   RBC 3.83 (*)    Hemoglobin 11.0 (*)    HCT 34.7 (*)    All other components within normal limits  DIFFERENTIAL - Abnormal; Notable for the following components:   Neutro Abs 7.8 (*)    All other components within normal limits  COMPREHENSIVE METABOLIC PANEL - Abnormal; Notable for the following components:   Chloride 113 (*)    Glucose, Bld 117 (*)    BUN 33 (*)    Creatinine, Ser 1.47 (*)    Total Protein 5.9 (*)    Albumin 3.3 (*)    GFR calc non Af Amer 31 (*)    GFR calc Af Amer 36 (*)    All other components within normal limits  RAPID URINE DRUG SCREEN, HOSP  PERFORMED - Abnormal; Notable for the following components:   Benzodiazepines POSITIVE (*)    All other components within normal limits  URINALYSIS, ROUTINE W REFLEX MICROSCOPIC - Abnormal; Notable for the following components:   Color, Urine AMBER (*)    APPearance HAZY (*)    All other components within normal limits  I-STAT CHEM 8, ED - Abnormal; Notable for the following components:   BUN 34 (*)    Creatinine,  Ser 1.30 (*)    Glucose, Bld 112 (*)    TCO2 20 (*)    Hemoglobin 11.2 (*)    HCT 33.0 (*)    All other components within normal limits  CBG MONITORING, ED - Abnormal; Notable for the following components:   Glucose-Capillary 112 (*)    All other components within normal limits  ETHANOL  PROTIME-INR  VALPROIC ACID LEVEL    EKG EKG Interpretation  Date/Time:  Friday October 31 2018 12:28:44 EDT Ventricular Rate:  80 PR Interval:    QRS Duration: 79 QT Interval:  389 QTC Calculation: 449 R Axis:   -22 Text Interpretation:  Sinus rhythm Inferior infarct, old no change from previous Confirmed by Charlesetta Shanks [phone]) on 10/31/2018 1:55:09 PM   Radiology Dg Chest Port 1 View  Result Date: 10/31/2018 CLINICAL DATA:  Altered mental status. EXAM: PORTABLE CHEST 1 VIEW COMPARISON:  Radiographs of October 12, 2018. FINDINGS: The heart size and mediastinal contours are within normal limits. Both lungs are clear. No pneumothorax or pleural effusion is noted. Atherosclerosis of thoracic aorta is noted. The visualized skeletal structures are unremarkable. IMPRESSION: No active disease. Aortic Atherosclerosis (ICD10-I70.0). Electronically Signed   By: Marijo Conception M.D.   On: 10/31/2018 13:21   Ct Head Code Stroke Wo Contrast  Result Date: 10/31/2018 CLINICAL DATA:  Code stroke. Altered level of consciousness. Left-sided weakness EXAM: CT HEAD WITHOUT CONTRAST TECHNIQUE: Contiguous axial images were obtained from the base of the skull through the vertex without intravenous contrast. COMPARISON:  CT head 10/12/2018 FINDINGS: Brain: Moderate to advanced atrophy unchanged. Extensive white matter disease bilaterally unchanged. Patchy hypodensity in the thalamus bilaterally unchanged. Negative for acute infarct, hemorrhage, mass. Vascular: Negative for hyperdense vessel Skull: Negative Sinuses/Orbits: Paranasal sinuses clear. Bilateral cataract surgery. Right mastoid effusion Other: None ASPECTS  ([place] Stroke Program Early CT Score) - Ganglionic level infarction (caudate, lentiform nuclei, internal capsule, insula, M1-M3 cortex): 7 - Supraganglionic infarction (M4-M6 cortex): 3 Total score (0-10 with 10 being normal): 10 IMPRESSION: 1. No acute abnormality 2. ASPECTS is 10 3. These results were called by telephone at the time of interpretation on 10/31/2018 at 12:27 pm to provider Rory Percy , who verbally acknowledged these results. Electronically Signed   By: Franchot Gallo M.D.   On: 10/31/2018 12:27    Procedures Procedures (including critical care time)  Medications Ordered in ED Medications  sodium chloride 0.9 % bolus 500 mL (0 mLs Intravenous Stopped 10/31/18 1439)     Initial Impression / Assessment and Plan / ED Course  I have reviewed the triage vital signs and the nursing notes.  Pertinent labs & imaging results that were available during my care of the patient were reviewed by me and considered in my medical decision making (see chart for details).       Patient arrived as code stroke.  Code stroke has been canceled by neurology.  Patient's presentation does not suggest any focal neurologic deficits.  Patient had been at her baseline this morning.  She is at  this point essentially wheelchair-bound.  I did talk to her son.  Her speech is very poorly intelligible.  He reports she has had significant decline over the past weeks.  He reports that she does recognize him but has lost recognition and recall of many things.  I described her speech pattern and he advised this is what is her baseline at this time.  Just shortly after arrival, patient produced a large fecal impaction of consolidated small stool pellets and a large volume of wet brown stool.  Patient did not have any focal deficits on exam to suggest a lateralizing stroke.  It appears she has been having significant decline and dementia problems over the past number of months and weeks.  She is nonambulatory at baseline.   I question if patient may have had a vasovagal episode if possibly trying to defecate seated in her wheelchair.  Her vital signs have been normal since arrival to the emergency department.  Diagnostic work-up does not show any acute abnormalities.  At this time, I do feel patient is appropriate to return to nursing care.  I did discuss this with her son who was in agreement.  Final Clinical Impressions(s) / ED Diagnoses   Final diagnoses:  Altered mental status, unspecified altered mental status type  Near syncope  Dementia with behavioral disturbance, unspecified dementia type [facility])    ED Discharge Orders    None       Charlesetta Shanks, MD 10/31/18 1446

## 2018-10-31 NOTE — Consult Note (Addendum)
NEURO HOSPITALIST CONSULT NOTE   Requestig physician: Dr. Johnney Killian   Reason for Consult: left gaze/ left flaccid   History obtained from: Chart  HPI:                                                                                                                                          Deborah Rivers is an 85 y.o. female  With PMH dementia, HLD, HTN, CKD 3 who presented to [facility] ED as a code stroke with c/o left gaze and left side flaccid. Patient lived at [facility]  Per EMS: [id number] was the LSN where patient was in wheelchair wheeling herself around. At 1100 when they came back to check on patient she was found apneic. They sternal rubbed her and she took a breath. She appeared to be gazing to the left and her left side was flaccid. EMS was called. She remained unresponsive until EMS loaded her in the ambulance and at that time she began mumbling and moaning.  Spoke with robert ( andy) Coven her son listed in the chart. Per his report since 8/24 patient has been steadily declining. He reports that on 8/24 patient was unable to carry on a conversation. She also has been unable to walk since then. For the past several months she has been unable to complete her ADLS, and for the past few days she has not been oriented. Daughter Romie Minus is POA ( number in chart)  LSN: [id number] 10/31/2018 CTH: no hemorrhage  NIHSS: 14    10/13/2018: Per previous note from Dr. Merlene Laughter: there was concern that she may have had a 1 time seizure. No AEDS started.  Past Medical History:  Diagnosis Date  . Allergy   . Anemia   . Anxiety   . Cataract   . Depression   . Diverticulitis   . Essential hypertension   . GERD (gastroesophageal reflux disease)   . Hyperlipidemia   . Hypothyroidism   . Recurrent UTI     Past Surgical History:  Procedure Laterality Date  . APPENDECTOMY    . EYE SURGERY    . HERNIA REPAIR    . TOTAL VAGINAL HYSTERECTOMY      Family History  Problem  Relation Age of Onset  . Stroke Mother   . Miscarriages / [place] Mother   . Hyperlipidemia Mother   . Hypertension Mother   . Heart attack Father   . Hyperlipidemia Father   . Hypertension Father   . Hearing loss Father   . Miscarriages / Stillbirths Sister   . Heart attack Maternal Grandmother   . Heart attack Maternal Grandfather   . Heart attack Paternal Grandmother   . Heart attack Paternal Grandfather   . Diabetes Other   . Heart disease Other   .  Kidney disease Other   . Heart attack Brother   . Cancer Sister            Social History:  reports that she has never smoked. She has never used smokeless tobacco. She reports that she does not drink alcohol or use drugs.  Allergies  Allergen Reactions  . Sulfa Antibiotics Nausea Only, Anxiety and Rash    MEDICATIONS:                                                                                                                     No current facility-administered medications for this encounter.    Current Outpatient Medications  Medication Sig Dispense Refill  . acetaminophen (TYLENOL) 325 MG tablet Take 2 tablets (650 mg total) by mouth every 6 (six) hours as needed for mild pain or headache (or Fever >/= 101). 12 tablet 0  . ALPRAZolam (XANAX) 0.25 MG tablet Take 1 tablet (0.25 mg total) by mouth 2 (two) times daily as needed for anxiety. 10 tablet 0  . amLODipine (NORVASC) 5 MG tablet Take 1 tablet (5 mg total) by mouth daily. 30 tablet 1  . aspirin 81 MG tablet Take 1 tablet (81 mg total) by mouth daily with breakfast. 90 tablet 1  . atorvastatin (LIPITOR) 20 MG tablet Take 1 tablet (20 mg total) by mouth daily at 6 PM. 90 tablet 3  . diclofenac sodium (VOLTAREN) 1 % GEL Apply 2 g topically 4 (four) times daily. 100 g 6  . divalproex (DEPAKOTE) 125 MG DR tablet Take 1 tablet (125 mg total) by mouth See admin instructions. 1 tablet every morning and 1 tablet nightly  (total of 2 tablets/day) 60 tablet 2  .  ergocalciferol (VITAMIN D2) 50000 UNITS capsule Take 50,000 Units by mouth once a week. Mon    . feeding supplement, ENSURE ENLIVE, (ENSURE ENLIVE) LIQD Take 237 mLs by mouth 2 (two) times daily between meals. 2000 mL 12  . ferrous gluconate (FERGON) 324 MG tablet Take 1 tablet (324 mg total) by mouth daily with breakfast. 90 tablet 3  . fluticasone (FLONASE) 50 MCG/ACT nasal spray Place 2 sprays into both nostrils daily. (Patient taking differently: Place 2 sprays into both nostrils daily as needed for allergies. ) 16 g 6  . megestrol (MEGACE) 400 MG/10ML suspension Take 10 mLs (400 mg total) by mouth daily. --For appetite stimulation 480 mL 0  . Multiple Vitamins-Minerals (CENTRUM SILVER PO) Take 1 tablet by mouth daily.    . ondansetron (ZOFRAN) 4 MG tablet Take 1 tablet (4 mg total) by mouth every 6 (six) hours as needed for nausea. 20 tablet 0  . potassium chloride SA (K-DUR) 20 MEQ tablet Take 1 tablet (20 mEq total) by mouth 2 (two) times daily. 14 tablet 0  . senna-docusate (SENOKOT-S) 8.6-50 MG tablet Take 2 tablets by mouth at bedtime. 60 tablet 1  . sertraline (ZOLOFT) 50 MG tablet Take 1 tablet (50 mg total) by mouth daily. 30 tablet 3  .  SYNTHROID 50 MCG tablet Take 1 tablet (50 mcg total) by mouth daily. 90 tablet 3  . traZODone (DESYREL) 100 MG tablet Take 1 tablet (100 mg total) by mouth at bedtime as needed for sleep (as Needed for sleep). 30 tablet 2      ROS:                                                                                                                                        unobtainable from patient due to mental status    General Examination:                                                                                                       Physical Exam  HEENT-  Normocephalic, no lesions, without obvious abnormality.  Normal external eye and conjunctiva.   Cardiovascular- S1-S2 audible, pulses palpable throughout   Lungs-no rhonchi or wheezing noted,  no excessive working breathing.  Saturations within normal limits on RA Abdomen- All 4 quadrants palpated and nontender Extremities- Warm, dry and intact Musculoskeletal-no joint tenderness, deformity or swelling Skin-warm and dry, no hyperpigmentation, vitiligo, or suspicious lesions  Neurological Examination Mental Status: Alert, not oriented,. Patient mumbled and moans. Unable to follow commands    Cranial Nerves: II: not blinking to threat bilaterally, no gaze preference, able to cross midline III,IV, VI: ptosis not present, extra-ocular motions intact bilaterally pupils equal, round, reactive to light and accommodation V,VII: face appears symmetric VIII: hearing normal bilaterally  Motor/ Sensory: Able to spontaneously move all 4 extremities with good strength. Withdraws all 4 to noxious. Deep Tendon Reflexes: 2+ and symmetric throughout Plantars: Right: downgoing   Left: downgoing Cerebellar: UTA Gait:deferred   Lab Results: Basic Metabolic Panel: Recent Labs  Lab 10/31/18 1216  NA 141  K 4.5  CL 111  GLUCOSE 112*  BUN 34*  CREATININE 1.30*    CBC: Recent Labs  Lab 10/31/18 1216  HGB 11.2*  HCT 33.0*    Imaging: Ct Head Code Stroke Wo Contrast  Result Date: 10/31/2018 CLINICAL DATA:  Code stroke. Altered level of consciousness. Left-sided weakness EXAM: CT HEAD WITHOUT CONTRAST TECHNIQUE: Contiguous axial images were obtained from the base of the skull through the vertex without intravenous contrast. COMPARISON:  CT head 10/12/2018 FINDINGS: Brain: Moderate to advanced atrophy unchanged. Extensive white matter disease bilaterally unchanged. Patchy hypodensity in the thalamus bilaterally unchanged. Negative for acute infarct, hemorrhage, mass. Vascular: Negative for hyperdense vessel Skull: Negative Sinuses/Orbits: Paranasal sinuses  clear. Bilateral cataract surgery. Right mastoid effusion Other: None ASPECTS ([place] Stroke Program Early CT Score) - Ganglionic  level infarction (caudate, lentiform nuclei, internal capsule, insula, M1-M3 cortex): 7 - Supraganglionic infarction (M4-M6 cortex): 3 Total score (0-10 with 10 being normal): 10 IMPRESSION: 1. No acute abnormality 2. ASPECTS is 10 3. These results were called by telephone at the time of interpretation on 10/31/2018 at 12:27 pm to provider Rory Percy , who verbally acknowledged these results. Electronically Signed   By: Franchot Gallo M.D.   On: 10/31/2018 12:27    Assessment:  Deborah Rivers is an 85 y.o. female  With PMH dementia, HLD, HTN, CKD 3 who presented to [facility] ED as a code stroke with c/o left gaze and left side flaccid. Patient lived at [facility] facility. Upon arrival patient was not flaccid in right or left. No gaze preference noted. CTH: no hemorrhage.  Exam atypical for stroke and grossly nonfocal-hence no TPA or endovascular treatment.  Impression: -Dementia with behavioral disturbance -Toxic metabolic encephalopathy -In the past has had some concern raised for seizure per Dr. Merlene Laughter but not started on antiepileptic.  If an EEG abnormalities seen, might consider starting antiepileptic  Recommendations: --MRI --EEG --Urinalysis --Chest x-ray  Laurey Morale, MSN, NP-C [facility] [phone]  Attending neurologist's note to follow   10/31/2018, 12:17 PM    Attending Neurohospitalist Addendum Patient seen and examined with APP/Resident. Agree with the history and physical as documented above. Agree with the plan as documented, which I helped formulate. I have independently reviewed the chart, obtained history, review of systems and examined the patient.I have personally reviewed pertinent head/neck/spine imaging (CT/MRI). Please feel free to call with any questions. --- Amie Portland, MD [facility] Pager: [phone]  If 7pm to 7am, please call on call as listed on AMION.

## 2018-10-31 NOTE — Discharge Instructions (Addendum)
1.  Patient's evaluation emergency department does not identify any specific problem.  Continue to care for her and provide her regular medications.  Follow-up with the facility physician as soon as possible. 2.  Patient did produce a large stool ball (baseball size) and defecation after arrival to the emergency department.  Patient may have had vasovagal episode if she was trying to defecate at the time of the episode.  Try to make sure patient is getting stool softeners regularly and check for stool impaction.

## 2018-11-05 ENCOUNTER — Telehealth: Payer: Self-pay | Admitting: Cardiovascular Disease

## 2018-11-05 DIAGNOSIS — G9341 Metabolic encephalopathy: Secondary | ICD-10-CM | POA: Diagnosis not present

## 2018-11-05 DIAGNOSIS — B9689 Other specified bacterial agents as the cause of diseases classified elsewhere: Secondary | ICD-10-CM | POA: Diagnosis not present

## 2018-11-05 DIAGNOSIS — N183 Chronic kidney disease, stage 3 (moderate): Secondary | ICD-10-CM | POA: Diagnosis not present

## 2018-11-05 DIAGNOSIS — N390 Urinary tract infection, site not specified: Secondary | ICD-10-CM | POA: Diagnosis not present

## 2018-11-05 NOTE — Telephone Encounter (Signed)
LVM for patient to call and schedule 1 yr followup with Dr. Berry. 

## 2018-11-11 ENCOUNTER — Other Ambulatory Visit: Payer: Self-pay

## 2018-11-12 ENCOUNTER — Ambulatory Visit (INDEPENDENT_AMBULATORY_CARE_PROVIDER_SITE_OTHER): Payer: Medicare Other | Admitting: Family Medicine

## 2018-11-12 ENCOUNTER — Telehealth: Payer: Self-pay | Admitting: *Deleted

## 2018-11-12 ENCOUNTER — Emergency Department (HOSPITAL_COMMUNITY): Payer: Medicare Other

## 2018-11-12 ENCOUNTER — Telehealth: Payer: Self-pay | Admitting: Family Medicine

## 2018-11-12 ENCOUNTER — Encounter: Payer: Self-pay | Admitting: Family Medicine

## 2018-11-12 ENCOUNTER — Emergency Department (HOSPITAL_COMMUNITY)
Admission: EM | Admit: 2018-11-12 | Discharge: 2018-11-12 | Disposition: A | Discharge status: Home / Self Care | Payer: Medicare Other | Attending: Emergency Medicine | Admitting: Emergency Medicine

## 2018-11-12 ENCOUNTER — Encounter (HOSPITAL_COMMUNITY): Payer: Self-pay | Admitting: Emergency Medicine

## 2018-11-12 VITALS — BP 111/59 | HR 96 | Temp 99.8°F | Resp 16

## 2018-11-12 DIAGNOSIS — Z7982 Long term (current) use of aspirin: Secondary | ICD-10-CM | POA: Diagnosis not present

## 2018-11-12 DIAGNOSIS — F0390 Unspecified dementia without behavioral disturbance: Secondary | ICD-10-CM | POA: Diagnosis not present

## 2018-11-12 DIAGNOSIS — R000 Tachycardia, unspecified: Secondary | ICD-10-CM | POA: Diagnosis not present

## 2018-11-12 DIAGNOSIS — R531 Weakness: Secondary | ICD-10-CM | POA: Diagnosis not present

## 2018-11-12 DIAGNOSIS — I129 Hypertensive chronic kidney disease with stage 1 through stage 4 chronic kidney disease, or unspecified chronic kidney disease: Secondary | ICD-10-CM | POA: Insufficient documentation

## 2018-11-12 DIAGNOSIS — R4182 Altered mental status, unspecified: Secondary | ICD-10-CM | POA: Diagnosis present

## 2018-11-12 DIAGNOSIS — F0281 Dementia in other diseases classified elsewhere with behavioral disturbance: Secondary | ICD-10-CM

## 2018-11-12 DIAGNOSIS — Z23 Encounter for immunization: Secondary | ICD-10-CM

## 2018-11-12 DIAGNOSIS — R404 Transient alteration of awareness: Secondary | ICD-10-CM | POA: Diagnosis not present

## 2018-11-12 DIAGNOSIS — F419 Anxiety disorder, unspecified: Secondary | ICD-10-CM

## 2018-11-12 DIAGNOSIS — E039 Hypothyroidism, unspecified: Secondary | ICD-10-CM | POA: Diagnosis not present

## 2018-11-12 DIAGNOSIS — N183 Chronic kidney disease, stage 3 (moderate): Secondary | ICD-10-CM | POA: Insufficient documentation

## 2018-11-12 DIAGNOSIS — Z79899 Other long term (current) drug therapy: Secondary | ICD-10-CM | POA: Insufficient documentation

## 2018-11-12 DIAGNOSIS — D631 Anemia in chronic kidney disease: Secondary | ICD-10-CM | POA: Diagnosis not present

## 2018-11-12 DIAGNOSIS — N390 Urinary tract infection, site not specified: Secondary | ICD-10-CM

## 2018-11-12 DIAGNOSIS — F02818 Dementia in other diseases classified elsewhere, unspecified severity, with other behavioral disturbance: Secondary | ICD-10-CM

## 2018-11-12 DIAGNOSIS — Z9181 History of falling: Secondary | ICD-10-CM | POA: Diagnosis not present

## 2018-11-12 DIAGNOSIS — R0689 Other abnormalities of breathing: Secondary | ICD-10-CM | POA: Diagnosis not present

## 2018-11-12 DIAGNOSIS — R4020 Unspecified coma: Secondary | ICD-10-CM | POA: Diagnosis not present

## 2018-11-12 DIAGNOSIS — B961 Klebsiella pneumoniae [K. pneumoniae] as the cause of diseases classified elsewhere: Secondary | ICD-10-CM | POA: Diagnosis not present

## 2018-11-12 DIAGNOSIS — G301 Alzheimer's disease with late onset: Secondary | ICD-10-CM | POA: Diagnosis not present

## 2018-11-12 DIAGNOSIS — I10 Essential (primary) hypertension: Secondary | ICD-10-CM | POA: Diagnosis not present

## 2018-11-12 DIAGNOSIS — G9341 Metabolic encephalopathy: Secondary | ICD-10-CM | POA: Diagnosis not present

## 2018-11-12 DIAGNOSIS — Z87440 Personal history of urinary (tract) infections: Secondary | ICD-10-CM | POA: Diagnosis not present

## 2018-11-12 HISTORY — DX: Anemia, unspecified: D64.9

## 2018-11-12 HISTORY — DX: Dependence on wheelchair: Z99.3

## 2018-11-12 HISTORY — DX: Unspecified dementia, unspecified severity, without behavioral disturbance, psychotic disturbance, mood disturbance, and anxiety: F03.90

## 2018-11-12 HISTORY — DX: Chronic kidney disease, unspecified: N18.9

## 2018-11-12 LAB — CBC WITH DIFFERENTIAL/PLATELET
Abs Immature Granulocytes: 0.04 10*3/uL (ref 0.00–0.07)
Basophils Absolute: 0.1 10*3/uL (ref 0.0–0.1)
Basophils Relative: 1 %
Eosinophils Absolute: 0.1 10*3/uL (ref 0.0–0.5)
Eosinophils Relative: 1 %
HCT: 34.2 % — ABNORMAL LOW (ref 36.0–46.0)
Hemoglobin: 10.4 g/dL — ABNORMAL LOW (ref 12.0–15.0)
Immature Granulocytes: 0 %
Lymphocytes Relative: 9 %
Lymphs Abs: 0.8 10*3/uL (ref 0.7–4.0)
MCH: 27.8 pg (ref 26.0–34.0)
MCHC: 30.4 g/dL (ref 30.0–36.0)
MCV: 91.4 fL (ref 80.0–100.0)
Monocytes Absolute: 0.6 10*3/uL (ref 0.1–1.0)
Monocytes Relative: 7 %
Neutro Abs: 7.8 10*3/uL — ABNORMAL HIGH (ref 1.7–7.7)
Neutrophils Relative %: 82 %
Platelets: 235 10*3/uL (ref 150–400)
RBC: 3.74 MIL/uL — ABNORMAL LOW (ref 3.87–5.11)
RDW: 15.2 % (ref 11.5–15.5)
WBC: 9.4 10*3/uL (ref 4.0–10.5)
nRBC: 0 % (ref 0.0–0.2)

## 2018-11-12 LAB — URINALYSIS, ROUTINE W REFLEX MICROSCOPIC
Bilirubin Urine: NEGATIVE
Glucose, UA: NEGATIVE mg/dL
Hgb urine dipstick: NEGATIVE
Ketones, ur: NEGATIVE mg/dL
Nitrite: NEGATIVE
Protein, ur: 30 mg/dL — AB
Specific Gravity, Urine: 1.019 (ref 1.005–1.030)
WBC, UA: >50 WBC/hpf — ABNORMAL HIGH (ref 0–5)
pH: 6 (ref 5.0–8.0)

## 2018-11-12 LAB — COMPREHENSIVE METABOLIC PANEL
ALT: 14 U/L (ref 0–44)
AST: 19 U/L (ref 15–41)
Albumin: 3.5 g/dL (ref 3.5–5.0)
Alkaline Phosphatase: 49 U/L (ref 38–126)
Anion gap: 6 (ref 5–15)
BUN: 33 mg/dL — ABNORMAL HIGH (ref 8–23)
CO2: 24 mmol/L (ref 22–32)
Calcium: 9.5 mg/dL (ref 8.9–10.3)
Chloride: 111 mmol/L (ref 98–111)
Creatinine, Ser: 1.86 mg/dL — ABNORMAL HIGH (ref 0.44–1.00)
GFR calc Af Amer: 27 mL/min — ABNORMAL LOW (ref >60)
GFR calc non Af Amer: 23 mL/min — ABNORMAL LOW (ref >60)
Glucose, Bld: 121 mg/dL — ABNORMAL HIGH (ref 70–99)
Potassium: 4.9 mmol/L (ref 3.5–5.1)
Sodium: 141 mmol/L (ref 135–145)
Total Bilirubin: 0.3 mg/dL (ref 0.3–1.2)
Total Protein: 6 g/dL — ABNORMAL LOW (ref 6.5–8.1)

## 2018-11-12 LAB — TROPONIN I (HIGH SENSITIVITY)
Troponin I (High Sensitivity): 6 ng/L (ref <18)
Troponin I (High Sensitivity): 6 ng/L (ref <18)

## 2018-11-12 LAB — LACTIC ACID, PLASMA
Lactic Acid, Venous: 1.6 mmol/L (ref 0.5–1.9)
Lactic Acid, Venous: 1.4 mmol/L (ref 0.5–1.9)

## 2018-11-12 LAB — VALPROIC ACID LEVEL: Valproic Acid Lvl: 29 ug/mL — ABNORMAL LOW (ref 50.0–100.0)

## 2018-11-12 MED ORDER — SODIUM CHLORIDE 0.9 % IV BOLUS
500.0000 mL | Freq: Once | INTRAVENOUS | Status: AC
  Administered 2018-11-12: 20:00:00 500 mL via INTRAVENOUS

## 2018-11-12 MED ORDER — VALPROIC ACID 250 MG PO CAPS: 250.0000 mg | ORAL_CAPSULE | Freq: Two times a day (BID) | ORAL | 1 refills | Status: DC

## 2018-11-12 MED ORDER — SODIUM CHLORIDE 0.9 % IV SOLN
1.0000 g | Freq: Once | INTRAVENOUS | Status: AC
  Administered 2018-11-12: 1 g via INTRAVENOUS
  Filled 2018-11-12: qty 10

## 2018-11-12 MED ORDER — CEPHALEXIN 500 MG PO CAPS: 500.0000 mg | ORAL_CAPSULE | Freq: Four times a day (QID) | ORAL | 0 refills | Status: DC

## 2018-11-12 NOTE — Telephone Encounter (Signed)
Yes I somehow missed that one, please continue the Depakote and discontinue the valproic acid or do not fill it.  We will discuss other options with family who are meeting with next week.

## 2018-11-12 NOTE — Telephone Encounter (Signed)
VM from [facility] Medication change clarification Today's Rx is for Valproic Acid (Depakene) 250 mg 1 BID Pt has been on Divalproex (Depakote) 125 mg 1 BID Please clarify change if appropriate

## 2018-11-12 NOTE — Progress Notes (Signed)
BP (!) 111/59   Pulse 96   Temp 99.8 F (37.7 C) (Temporal)   Resp 16   SpO2 98%    Subjective:   Patient ID: Deborah Rivers, female    DOB: 01-30-1925, 85 y.o.   MRN: [id number]  HPI: Deborah Rivers is a 85 y.o. female presenting on 11/12/2018 for alzheimer's disease (3 month follow up- Patient she has been screaming out & now a two person assist )   HPI Worsening dementia and behavioral Patient has been having worsening dementia and behavioral issues, this has progressed over the past few months.  She went to an extended care more intensive care unit and she came back worse from that unit.  She is back at the nursing home she was previously and they said she is just not being herself and she was shot out frequently.  she is incontinent of urine, but they have not noticed any changes over the last little while or blood in her urine.  She is not able to verbalize whether or not she is in pain.  Relevant past medical, surgical, family and social history reviewed and updated as indicated. Interim medical history since our last visit reviewed. Allergies and medications reviewed and updated.  Review of Systems  Constitutional: Negative for chills and fever.  Eyes: Negative for visual disturbance.  Respiratory: Negative for shortness of breath and stridor.   Neurological: Negative for headaches.  Psychiatric/Behavioral: Positive for decreased concentration, dysphoric mood and sleep disturbance. Negative for agitation, behavioral problems, self-injury and suicidal ideas. The patient is nervous/anxious.   All other systems reviewed and are negative.   Per HPI unless specifically indicated above   Allergies as of 11/12/2018      Reactions   Sulfa Antibiotics Nausea Only, Anxiety, Rash      Medication List       Accurate as of November 12, 2018 11:59 PM. If you have any questions, ask your nurse or doctor.        STOP taking these medications   feeding supplement (ENSURE  ENLIVE) Liqd Stopped by: Fransisca Kaufmann Dettinger, MD     TAKE these medications   acetaminophen 325 MG tablet Commonly known as: TYLENOL Take 2 tablets (650 mg total) by mouth every 6 (six) hours as needed for mild pain or headache (or Fever >/= 101).   ALPRAZolam 0.25 MG tablet Commonly known as: XANAX Take 1 tablet (0.25 mg total) by mouth 2 (two) times daily as needed for anxiety.   amLODipine 5 MG tablet Commonly known as: NORVASC Take 1 tablet (5 mg total) by mouth daily.   aspirin 81 MG tablet Take 1 tablet (81 mg total) by mouth daily with breakfast.   atorvastatin 20 MG tablet Commonly known as: LIPITOR Take 1 tablet (20 mg total) by mouth daily at 6 PM.   b complex vitamins tablet Take 1 tablet by mouth daily.   busPIRone 5 MG tablet Commonly known as: BUSPAR Take 5 mg by mouth 2 (two) times daily.   CENTRUM SILVER PO Take 1 tablet by mouth daily.   cephALEXin 500 MG capsule Commonly known as: KEFLEX Take 1 capsule (500 mg total) by mouth 4 (four) times daily.   diclofenac sodium 1 % Gel Commonly known as: VOLTAREN Apply 2 g topically 4 (four) times daily.   divalproex 125 MG DR tablet Commonly known as: DEPAKOTE Take 2 tablets (250 mg total) by mouth 2 (two) times daily. What changed:   how much to  take  when to take this  additional instructions Changed by: Lucienne Minks, LPN   docusate sodium 100 MG capsule Commonly known as: COLACE Take 100 mg by mouth daily.   ergocalciferol 1.25 MG (50000 UT) capsule Commonly known as: VITAMIN D2 Take 50,000 Units by mouth once a week. Mon   ferrous gluconate 324 MG tablet Commonly known as: FERGON Take 1 tablet (324 mg total) by mouth daily with breakfast.   fluticasone 50 MCG/ACT nasal spray Commonly known as: FLONASE Place 2 sprays into both nostrils daily.   megestrol 400 MG/10ML suspension Commonly known as: MEGACE Take 10 mLs (400 mg total) by mouth daily. --For appetite stimulation    ondansetron 4 MG tablet Commonly known as: ZOFRAN Take 1 tablet (4 mg total) by mouth every 6 (six) hours as needed for nausea.   polyethylene glycol 17 g packet Commonly known as: MIRALAX / GLYCOLAX Take 17 g by mouth daily as needed for mild constipation.   potassium chloride SA 20 MEQ tablet Commonly known as: KLOR-CON Take 1 tablet (20 mEq total) by mouth 2 (two) times daily.   senna-docusate 8.6-50 MG tablet Commonly known as: Senokot-S Take 2 tablets by mouth at bedtime.   sertraline 50 MG tablet Commonly known as: ZOLOFT Take 1 tablet (50 mg total) by mouth daily.   Synthroid 50 MCG tablet Generic drug: levothyroxine Take 1 tablet (50 mcg total) by mouth daily.   traZODone 100 MG tablet Commonly known as: DESYREL Take 1 tablet (100 mg total) by mouth at bedtime as needed for sleep (as Needed for sleep).   zinc sulfate 220 (50 Zn) MG capsule Take 220 mg by mouth daily.        Objective:   BP (!) 111/59   Pulse 96   Temp 99.8 F (37.7 C) (Temporal)   Resp 16   SpO2 98%   Wt Readings from Last 3 Encounters:  10/31/18 106 lb 11.2 oz (48.4 kg)  10/12/18 106 lb 11.2 oz (48.4 kg)  09/11/18 106 lb 9.6 oz (48.4 kg)    Physical Exam Vitals signs and nursing note reviewed.  Constitutional:      General: She is not in acute distress.    Appearance: She is well-developed. She is not diaphoretic.  Eyes:     Conjunctiva/sclera: Conjunctivae normal.  Cardiovascular:     Rate and Rhythm: Normal rate and regular rhythm.     Heart sounds: Normal heart sounds. No murmur.  Pulmonary:     Effort: Pulmonary effort is normal. No respiratory distress.     Breath sounds: Normal breath sounds. No wheezing.  Abdominal:     General: Abdomen is flat. Bowel sounds are normal. There is no distension.     Tenderness: There is no abdominal tenderness. There is no right CVA tenderness, guarding or rebound.  Musculoskeletal: Normal range of motion.        General: No tenderness.   Skin:    General: Skin is warm and dry.     Findings: No rash.  Neurological:     Mental Status: She is alert and oriented to person, place, and time.     Coordination: Coordination normal.  Psychiatric:        Behavior: Behavior normal.       Assessment & Plan:   Problem List Items Addressed This Visit      Nervous and Auditory   Dementia ([place]) - Primary     Other   Anxiety    Other  Visit Diagnoses    Need for immunization against influenza       Relevant Orders   Flu Vaccine QUAD High Dose(Fluad) (Completed)      Dementia and behavioral worsening, recommended for urinalysis and culture to be collected at the facility and call us with the orders.  Patient has lost a lot of weight, recommended Ensure 3 times daily Schedule a visit with family discuss issues.  Changed FL2 to include memory care Follow up plan: Return if symptoms worsen or fail to improve, for return in 2 to 3 weeks to have family discussion if we cannot contact them for then.  Counseling provided for all of the vaccine components Orders Placed This Encounter  Procedures  . Flu Vaccine QUAD High Dose(Fluad)    Caryl Pina, MD [facility] 11/18/2018, 9:11 PM

## 2018-11-12 NOTE — Telephone Encounter (Signed)
Thanks for the FYI

## 2018-11-12 NOTE — ED Triage Notes (Signed)
Pt arrived via EMS from [facility]. Pt "checked out"  And would not respond per staff. VSS.

## 2018-11-12 NOTE — ED Provider Notes (Signed)
[facility] Provider Note   CSN: [id number] Arrival date & time: 11/12/18  1508     History   Chief Complaint Chief Complaint  Patient presents with  . Altered Mental Status    HPI Deborah Rivers is a 85 y.o. female.     The history is provided by the EMS personnel and the nursing home. The history is limited by the condition of the patient (Hx dementia).  Altered Mental Status Pt was seen at 1540.  Per EMS and NH report: NH staff states pt "checked out;" describing this as "staring off for a second and would not respond to them" when they were trying to talk with her. During EMS arrival to scene, during transport, and on arrival to ED, pt intermittently mumbling and screaming loudly. Pt has significant hx of dementia and currently denies any complaints.     Past Medical History:  Diagnosis Date  . Allergy   . Anemia   . Anxiety   . Cataract   . Chronic anemia   . CKD (chronic kidney disease)   . Dementia ([place])   . Depression   . Diverticulitis   . Essential hypertension   . GERD (gastroesophageal reflux disease)   . Hyperlipidemia   . Hypothyroidism   . Recurrent UTI   . Wheelchair bound     Patient Active Problem List   Diagnosis Date Noted  . Hypokalemia 03/21/2018  . Leg cramp 08/10/2017  . Fatigue 08/10/2017  . Hypothyroidism   . Recurrent UTI   . GERD (gastroesophageal reflux disease)   . Depression   . Cataract   . Anxiety   . Abnormal CT scan, colon 12/16/2015  . Mood disorder ([place]) 11/20/2015  . Dementia ([place]) 11/20/2015  . CKD (chronic kidney disease), stage III ([place]) 11/20/2015  . Aortic calcification ([place]) 02/26/2014  . Essential hypertension 02/26/2014  . Mixed hyperlipidemia 02/26/2014  . Anemia 07/20/2013    Past Surgical History:  Procedure Laterality Date  . APPENDECTOMY    . EYE SURGERY    . HERNIA REPAIR    . TOTAL VAGINAL HYSTERECTOMY       OB History   No obstetric history on file.      Home  Medications    Prior to Admission medications   Medication Sig Start Date End Date Taking? Authorizing Provider  acetaminophen (TYLENOL) 325 MG tablet Take 2 tablets (650 mg total) by mouth every 6 (six) hours as needed for mild pain or headache (or Fever >/= 101). 10/15/18   Emokpae, Courage, MD  ALPRAZolam (XANAX) 0.25 MG tablet Take 1 tablet (0.25 mg total) by mouth 2 (two) times daily as needed for anxiety. 10/22/18   Roxan Hockey, MD  amLODipine (NORVASC) 5 MG tablet Take 1 tablet (5 mg total) by mouth daily. 10/15/18   Roxan Hockey, MD  aspirin 81 MG tablet Take 1 tablet (81 mg total) by mouth daily with breakfast. 10/15/18   Roxan Hockey, MD  atorvastatin (LIPITOR) 20 MG tablet Take 1 tablet (20 mg total) by mouth daily at 6 PM. 11/04/17   Dettinger, Fransisca Kaufmann, MD  b complex vitamins tablet Take 1 tablet by mouth daily.    [provider]  busPIRone (BUSPAR) 5 MG tablet Take 5 mg by mouth 2 (two) times daily.    [provider]  diclofenac sodium (VOLTAREN) 1 % GEL Apply 2 g topically 4 (four) times daily. 06/12/18   Dettinger, Fransisca Kaufmann, MD  divalproex (DEPAKOTE) 125 MG  DR tablet Take 1 tablet (125 mg total) by mouth See admin instructions. 1 tablet every morning and 1 tablet nightly  (total of 2 tablets/day) 10/15/18   Roxan Hockey, MD  docusate sodium (COLACE) 100 MG capsule Take 100 mg by mouth daily.    [provider]  ergocalciferol (VITAMIN D2) 50000 UNITS capsule Take 50,000 Units by mouth once a week. Mon    [provider]  ferrous gluconate (FERGON) 324 MG tablet Take 1 tablet (324 mg total) by mouth daily with breakfast. 08/18/18   Dettinger, Fransisca Kaufmann, MD  fluticasone (FLONASE) 50 MCG/ACT nasal spray Place 2 sprays into both nostrils daily. 08/02/17   Timmothy [facility], MD  megestrol (MEGACE) 400 MG/10ML suspension Take 10 mLs (400 mg total) by mouth daily. --For appetite stimulation 10/15/18   Roxan Hockey, MD  Multiple  Vitamins-Minerals (CENTRUM SILVER PO) Take 1 tablet by mouth daily.    [provider]  ondansetron (ZOFRAN) 4 MG tablet Take 1 tablet (4 mg total) by mouth every 6 (six) hours as needed for nausea. 10/15/18   Roxan Hockey, MD  polyethylene glycol (MIRALAX / GLYCOLAX) 17 g packet Take 17 g by mouth daily as needed for mild constipation.    [provider]  potassium chloride SA (K-DUR) 20 MEQ tablet Take 1 tablet (20 mEq total) by mouth 2 (two) times daily. 07/24/18   Idol, Almyra Free, PA-C  senna-docusate (SENOKOT-S) 8.6-50 MG tablet Take 2 tablets by mouth at bedtime. 10/15/18 10/15/19  Roxan Hockey, MD  sertraline (ZOLOFT) 50 MG tablet Take 1 tablet (50 mg total) by mouth daily. 09/11/18   Dettinger, Fransisca Kaufmann, MD  SYNTHROID 50 MCG tablet Take 1 tablet (50 mcg total) by mouth daily. 11/04/17   Dettinger, Fransisca Kaufmann, MD  traZODone (DESYREL) 100 MG tablet Take 1 tablet (100 mg total) by mouth at bedtime as needed for sleep (as Needed for sleep). 10/22/18   Roxan Hockey, MD  valproic acid (DEPAKENE) 250 MG capsule Take 1 capsule (250 mg total) by mouth 2 (two) times daily. 11/12/18   Dettinger, Fransisca Kaufmann, MD  zinc sulfate 220 (50 Zn) MG capsule Take 220 mg by mouth daily.    [provider]    Family History Family History  Problem Relation Age of Onset  . Stroke Mother   . Miscarriages / [place] Mother   . Hyperlipidemia Mother   . Hypertension Mother   . Heart attack Father   . Hyperlipidemia Father   . Hypertension Father   . Hearing loss Father   . Miscarriages / Stillbirths Sister   . Heart attack Maternal Grandmother   . Heart attack Maternal Grandfather   . Heart attack Paternal Grandmother   . Heart attack Paternal Grandfather   . Diabetes Other   . Heart disease Other   . Kidney disease Other   . Heart attack Brother   . Cancer Sister     Social History Social History   Tobacco Use  . Smoking status: Never Smoker  . Smokeless tobacco: Never  Used  Substance Use Topics  . Alcohol use: No    Alcohol/week: 0.0 standard drinks  . Drug use: No     Allergies   Sulfa antibiotics   Review of Systems Review of Systems  Unable to perform ROS: Dementia     Physical Exam Updated Vital Signs BP 112/62 (BP Location: Left Arm)   Pulse 86   Temp 98.2 F (36.8 C) (Axillary)   Resp 12  SpO2 98%   Physical Exam 1545: Physical examination:  Nursing notes reviewed; Vital signs and O2 SAT reviewed;  Constitutional: Thin, frail. In no acute distress; Head:  Normocephalic, atraumatic; Eyes: EOMI, PERRL, No scleral icterus; ENMT: Mouth and pharynx normal, Mucous membranes dry; Neck: Supple, Full range of motion, No lymphadenopathy; Cardiovascular: Regular rate and rhythm, No gallop; Respiratory: Breath sounds clear & equal bilaterally, No wheezes.  Speaking full sentences with ease, Normal respiratory effort/excursion; Chest: Nontender, Movement normal; Abdomen: Soft, Nontender, Nondistended, Normal bowel sounds; Genitourinary: No CVA tenderness; Extremities: Peripheral pulses normal, No tenderness, No edema, No calf edema or asymmetry.; Neuro: Awake, alert, confused per hx dementia. No facial droop.  Speech minimal, but is screaming and moaning loudly. Grips equal. Moves all extremities spontaneously and to command without apparent gross focal motor deficits in extremities.; Skin: Color normal, Warm, Dry.    ED Treatments / Results  Labs (all labs ordered are listed, but only abnormal results are displayed)   EKG None  Radiology   Procedures Procedures (including critical care time)  Medications Ordered in ED Medications - No data to display   Initial Impression / Assessment and Plan / ED Course  I have reviewed the triage vital signs and the nursing notes.  Pertinent labs & imaging results that were available during my care of the patient were reviewed by me and considered in my medical decision making (see chart for  details).     MDM Reviewed: previous chart, nursing note and vitals Reviewed previous: labs and ECG Interpretation: labs, ECG, x-ray and CT scan    Results for orders placed or performed during the hospital encounter of 11/12/18  Comprehensive metabolic panel  Result Value Ref Range   Sodium 141 135 - 145 mmol/L   Potassium 4.9 3.5 - 5.1 mmol/L   Chloride 111 98 - 111 mmol/L   CO2 24 22 - 32 mmol/L   Glucose, Bld 121 (H) 70 - 99 mg/dL   BUN 33 (H) 8 - 23 mg/dL   Creatinine, Ser 1.86 (H) 0.44 - 1.00 mg/dL   Calcium 9.5 8.9 - 10.3 mg/dL   Total Protein 6.0 (L) 6.5 - 8.1 g/dL   Albumin 3.5 3.5 - 5.0 g/dL   AST 19 15 - 41 U/L   ALT 14 0 - 44 U/L   Alkaline Phosphatase 49 38 - 126 U/L   Total Bilirubin 0.3 0.3 - 1.2 mg/dL   GFR calc non Af Amer 23 (L) >60 mL/min   GFR calc Af Amer 27 (L) >60 mL/min   Anion gap 6 5 - 15  Lactic acid, plasma  Result Value Ref Range   Lactic Acid, Venous 1.6 0.5 - 1.9 mmol/L  Lactic acid, plasma  Result Value Ref Range   Lactic Acid, Venous 1.4 0.5 - 1.9 mmol/L  CBC with Differential  Result Value Ref Range   WBC 9.4 4.0 - 10.5 K/uL   RBC 3.74 (L) 3.87 - 5.11 MIL/uL   Hemoglobin 10.4 (L) 12.0 - 15.0 g/dL   HCT 34.2 (L) 36.0 - 46.0 %   MCV 91.4 80.0 - 100.0 fL   MCH 27.8 26.0 - 34.0 pg   MCHC 30.4 30.0 - 36.0 g/dL   RDW 15.2 11.5 - 15.5 %   Platelets 235 150 - 400 K/uL   nRBC 0.0 0.0 - 0.2 %   Neutrophils Relative % 82 %   Neutro Abs 7.8 (H) 1.7 - 7.7 K/uL   Lymphocytes Relative 9 %   Lymphs  Abs 0.8 0.7 - 4.0 K/uL   Monocytes Relative 7 %   Monocytes Absolute 0.6 0.1 - 1.0 K/uL   Eosinophils Relative 1 %   Eosinophils Absolute 0.1 0.0 - 0.5 K/uL   Basophils Relative 1 %   Basophils Absolute 0.1 0.0 - 0.1 K/uL   Immature Granulocytes 0 %   Abs Immature Granulocytes 0.04 0.00 - 0.07 K/uL  Urinalysis, Routine w reflex microscopic  Result Value Ref Range   Color, Urine AMBER (A) YELLOW   APPearance CLOUDY (A) CLEAR   Specific  Gravity, Urine 1.019 1.005 - 1.030   pH 6.0 5.0 - 8.0   Glucose, UA NEGATIVE NEGATIVE mg/dL   Hgb urine dipstick NEGATIVE NEGATIVE   Bilirubin Urine NEGATIVE NEGATIVE   Ketones, ur NEGATIVE NEGATIVE mg/dL   Protein, ur 30 (A) NEGATIVE mg/dL   Nitrite NEGATIVE NEGATIVE   Leukocytes,Ua SMALL (A) NEGATIVE   RBC / HPF 0-5 0 - 5 RBC/hpf   WBC, UA >50 (H) 0 - 5 WBC/hpf   Bacteria, UA RARE (A) NONE SEEN   Squamous Epithelial / LPF 0-5 0 - 5   Mucus PRESENT   Valproic acid level  Result Value Ref Range   Valproic Acid Lvl 29 (L) 50.0 - 100.0 ug/mL  Troponin I (High Sensitivity)  Result Value Ref Range   Troponin I (High Sensitivity) 6 <18 ng/L  Troponin I (High Sensitivity)  Result Value Ref Range   Troponin I (High Sensitivity) 6 <18 ng/L   Dg Chest 1 View Result Date: 11/12/2018 CLINICAL DATA:  Weakness, altered mental status EXAM: CHEST  1 VIEW COMPARISON:  10/31/2018 and multiple prior studies. FINDINGS: Study is rotated to the right. There is dense atherosclerotic changes of the thoracic aorta. Cardiomediastinal contours are stable. Lungs are clear. No acute bone process. IMPRESSION: Marked aortic atherosclerosis. No signs of acute cardiopulmonary disease. Aortic Atherosclerosis (ICD10-I70.0). Electronically Signed   By: Zetta Bills M.D.   On: 11/12/2018 16:51   Ct Head Wo Contrast Result Date: 11/12/2018 CLINICAL DATA:  Muscle weakness, altered mental status EXAM: CT HEAD WITHOUT CONTRAST TECHNIQUE: Contiguous axial images were obtained from the base of the skull through the vertex without intravenous contrast. COMPARISON:  10/31/2018 FINDINGS: Brain: Signs of advanced white matter disease and atrophy similar to prior study no signs of extra-axial fluid collection or intracranial hemorrhage. Vascular: No hyperdense vessel or unexpected calcification.  He Skull: Chronic right mastoid effusion with some sclerosis. Left mastoid air cells remain clear. No signs of skull fracture.  Sinuses/Orbits: Visualized paranasal sinuses are clear. Other: None. IMPRESSION: No acute intracranial finding with chronic changes of atrophy and extensive white matter disease. Electronically Signed   By: Zetta Bills M.D.   On: 11/12/2018 16:40    Deborah Rivers was evaluated in Emergency Department on 11/12/2018 for the symptoms described in the history of present illness. She was evaluated in the context of the global COVID-19 pandemic, which necessitated consideration that the patient might be at risk for infection with the SARS-CoV-2 virus that causes COVID-19. Institutional protocols and algorithms that pertain to the evaluation of patients at risk for COVID-19 are in a state of rapid change based on information released by regulatory bodies including the CDC and federal and state organizations. These policies and algorithms were followed during the patient's care in the ED.   1945:  +UTI, UC pending; IV rocephin given. Pt has been awake/alert her entire ED stay, intermittently moaning and making verbalizations to staff. No clear indication  for admission at this time. Tx UTI, f/u PMD. Will d/c back to facility stable.    Final Clinical Impressions(s) / ED Diagnoses   Final diagnoses:  None    ED Discharge Orders    None       Francine Graven, DO 11/18/18 1101

## 2018-11-12 NOTE — Telephone Encounter (Signed)
Spoke with patient's daughter on the phone and discussed options and they will come in next week to discuss options more in person after she leaves the emergency department today.

## 2018-11-12 NOTE — Discharge Instructions (Addendum)
Take the prescription as directed.  Call your regular medical doctor tomorrow to schedule a follow up appointment within the next 2 days.  Return to the Emergency Department immediately sooner if worsening.  ° °

## 2018-11-13 ENCOUNTER — Telehealth: Payer: Self-pay | Admitting: *Deleted

## 2018-11-13 ENCOUNTER — Telehealth: Payer: Self-pay | Admitting: Family Medicine

## 2018-11-13 ENCOUNTER — Ambulatory Visit: Payer: Medicare Other | Admitting: Family Medicine

## 2018-11-13 LAB — URINE CULTURE: Culture: >=100000 — AB

## 2018-11-13 NOTE — Telephone Encounter (Signed)
I made that error, she should not be taking the Depakene or the valproic acid, she should just be taking the Depakote

## 2018-11-13 NOTE — Telephone Encounter (Signed)
Attempted to return call - VM full.

## 2018-11-13 NOTE — Telephone Encounter (Signed)
Pharmacy aware and verbalizes understanding.  °

## 2018-11-13 NOTE — Telephone Encounter (Signed)
Yes I am good with her having double portions on her food.  Let me know how her memory and mood does when she is treated for the UTI and if improves or not.

## 2018-11-13 NOTE — Telephone Encounter (Signed)
Attempted to contact [facility]

## 2018-11-13 NOTE — Telephone Encounter (Signed)
Patient prescribed Depakote and Depakene. Which is patient to be taking?

## 2018-11-14 ENCOUNTER — Telehealth: Payer: Self-pay | Admitting: Emergency Medicine

## 2018-11-14 NOTE — Telephone Encounter (Signed)
Post ED Visit - Positive Culture Follow-up  Culture report reviewed by antimicrobial stewardship pharmacist: [facility] Team []  Elenor Quinones, Pharm.D. []  Heide Guile, Pharm.D., BCPS AQ-ID []  Parks Neptune, Pharm.D., BCPS []  Alycia Rossetti, Pharm.D., BCPS []  [place], Pharm.D., BCPS, AAHIVP []  Legrand Como, Pharm.D., BCPS, AAHIVP []  Salome Arnt, PharmD, BCPS []  Johnnette Gourd, PharmD, BCPS [x]  Hughes Better, PharmD, BCPS []  Leeroy Cha, PharmD []  Laqueta Linden, PharmD, BCPS []  Albertina Parr, PharmD  [place] Team []  Leodis Sias, PharmD []  Lindell Spar, PharmD []  Royetta Asal, PharmD []  Graylin Shiver, Rph []  Rema Fendt) Glennon Mac, PharmD []  Arlyn Dunning, PharmD []  Netta Cedars, PharmD []  Dia Sitter, PharmD []  Leone Haven, PharmD []  Gretta Arab, PharmD []  Theodis Shove, PharmD []  Peggyann Juba, PharmD []  Reuel Boom, PharmD   Positive urine culture Treated with Cephalexin, organism sensitive to the same and no further patient follow-up is required at this time.  Sandi Raveling Evangelos Paulino 11/14/2018, 10:44 AM

## 2018-11-17 DIAGNOSIS — Z87440 Personal history of urinary (tract) infections: Secondary | ICD-10-CM | POA: Diagnosis not present

## 2018-11-17 DIAGNOSIS — B961 Klebsiella pneumoniae [K. pneumoniae] as the cause of diseases classified elsewhere: Secondary | ICD-10-CM | POA: Diagnosis not present

## 2018-11-17 DIAGNOSIS — N183 Chronic kidney disease, stage 3 (moderate): Secondary | ICD-10-CM | POA: Diagnosis not present

## 2018-11-17 DIAGNOSIS — I129 Hypertensive chronic kidney disease with stage 1 through stage 4 chronic kidney disease, or unspecified chronic kidney disease: Secondary | ICD-10-CM | POA: Diagnosis not present

## 2018-11-17 DIAGNOSIS — G301 Alzheimer's disease with late onset: Secondary | ICD-10-CM | POA: Diagnosis not present

## 2018-11-17 DIAGNOSIS — N390 Urinary tract infection, site not specified: Secondary | ICD-10-CM | POA: Diagnosis not present

## 2018-11-17 DIAGNOSIS — E039 Hypothyroidism, unspecified: Secondary | ICD-10-CM | POA: Diagnosis not present

## 2018-11-17 DIAGNOSIS — D631 Anemia in chronic kidney disease: Secondary | ICD-10-CM | POA: Diagnosis not present

## 2018-11-17 DIAGNOSIS — Z9181 History of falling: Secondary | ICD-10-CM | POA: Diagnosis not present

## 2018-11-17 DIAGNOSIS — G9341 Metabolic encephalopathy: Secondary | ICD-10-CM | POA: Diagnosis not present

## 2018-11-17 DIAGNOSIS — Z7982 Long term (current) use of aspirin: Secondary | ICD-10-CM | POA: Diagnosis not present

## 2018-11-17 MED ORDER — DIVALPROEX SODIUM 125 MG PO DR TAB: 250.0000 mg | DELAYED_RELEASE_TABLET | Freq: Two times a day (BID) | ORAL | 2 refills | Status: AC

## 2018-11-17 NOTE — Addendum Note (Signed)
Addended by: Wardell Heath on: 11/17/2018 11:42 AM   Modules accepted: Orders

## 2018-11-18 DIAGNOSIS — I129 Hypertensive chronic kidney disease with stage 1 through stage 4 chronic kidney disease, or unspecified chronic kidney disease: Secondary | ICD-10-CM | POA: Diagnosis not present

## 2018-11-18 DIAGNOSIS — N183 Chronic kidney disease, stage 3 (moderate): Secondary | ICD-10-CM | POA: Diagnosis not present

## 2018-11-18 DIAGNOSIS — N390 Urinary tract infection, site not specified: Secondary | ICD-10-CM | POA: Diagnosis not present

## 2018-11-18 DIAGNOSIS — Z9181 History of falling: Secondary | ICD-10-CM | POA: Diagnosis not present

## 2018-11-18 DIAGNOSIS — G9341 Metabolic encephalopathy: Secondary | ICD-10-CM | POA: Diagnosis not present

## 2018-11-18 DIAGNOSIS — D631 Anemia in chronic kidney disease: Secondary | ICD-10-CM | POA: Diagnosis not present

## 2018-11-18 DIAGNOSIS — G301 Alzheimer's disease with late onset: Secondary | ICD-10-CM | POA: Diagnosis not present

## 2018-11-18 DIAGNOSIS — E039 Hypothyroidism, unspecified: Secondary | ICD-10-CM | POA: Diagnosis not present

## 2018-11-18 DIAGNOSIS — B961 Klebsiella pneumoniae [K. pneumoniae] as the cause of diseases classified elsewhere: Secondary | ICD-10-CM | POA: Diagnosis not present

## 2018-11-18 DIAGNOSIS — Z7982 Long term (current) use of aspirin: Secondary | ICD-10-CM | POA: Diagnosis not present

## 2018-11-18 DIAGNOSIS — Z87440 Personal history of urinary (tract) infections: Secondary | ICD-10-CM | POA: Diagnosis not present

## 2018-11-19 DIAGNOSIS — Z7982 Long term (current) use of aspirin: Secondary | ICD-10-CM | POA: Diagnosis not present

## 2018-11-19 DIAGNOSIS — B961 Klebsiella pneumoniae [K. pneumoniae] as the cause of diseases classified elsewhere: Secondary | ICD-10-CM | POA: Diagnosis not present

## 2018-11-19 DIAGNOSIS — N390 Urinary tract infection, site not specified: Secondary | ICD-10-CM | POA: Diagnosis not present

## 2018-11-19 DIAGNOSIS — G9341 Metabolic encephalopathy: Secondary | ICD-10-CM | POA: Diagnosis not present

## 2018-11-19 DIAGNOSIS — Z9181 History of falling: Secondary | ICD-10-CM | POA: Diagnosis not present

## 2018-11-19 DIAGNOSIS — D631 Anemia in chronic kidney disease: Secondary | ICD-10-CM | POA: Diagnosis not present

## 2018-11-19 DIAGNOSIS — N183 Chronic kidney disease, stage 3 (moderate): Secondary | ICD-10-CM | POA: Diagnosis not present

## 2018-11-19 DIAGNOSIS — Z87440 Personal history of urinary (tract) infections: Secondary | ICD-10-CM | POA: Diagnosis not present

## 2018-11-19 DIAGNOSIS — G301 Alzheimer's disease with late onset: Secondary | ICD-10-CM | POA: Diagnosis not present

## 2018-11-19 DIAGNOSIS — I129 Hypertensive chronic kidney disease with stage 1 through stage 4 chronic kidney disease, or unspecified chronic kidney disease: Secondary | ICD-10-CM | POA: Diagnosis not present

## 2018-11-19 DIAGNOSIS — E039 Hypothyroidism, unspecified: Secondary | ICD-10-CM | POA: Diagnosis not present

## 2018-11-19 NOTE — Telephone Encounter (Signed)
Attempted to contact [facility] but was on hold for attendant and became disconnected.

## 2018-11-21 ENCOUNTER — Other Ambulatory Visit: Payer: Self-pay

## 2018-11-21 ENCOUNTER — Ambulatory Visit (INDEPENDENT_AMBULATORY_CARE_PROVIDER_SITE_OTHER): Payer: Medicare Other

## 2018-11-21 DIAGNOSIS — Z7982 Long term (current) use of aspirin: Secondary | ICD-10-CM | POA: Diagnosis not present

## 2018-11-21 DIAGNOSIS — Z87440 Personal history of urinary (tract) infections: Secondary | ICD-10-CM

## 2018-11-21 DIAGNOSIS — G9341 Metabolic encephalopathy: Secondary | ICD-10-CM

## 2018-11-21 DIAGNOSIS — B961 Klebsiella pneumoniae [K. pneumoniae] as the cause of diseases classified elsewhere: Secondary | ICD-10-CM

## 2018-11-21 DIAGNOSIS — G301 Alzheimer's disease with late onset: Secondary | ICD-10-CM | POA: Diagnosis not present

## 2018-11-21 DIAGNOSIS — N390 Urinary tract infection, site not specified: Secondary | ICD-10-CM | POA: Diagnosis not present

## 2018-11-21 DIAGNOSIS — F0281 Dementia in other diseases classified elsewhere with behavioral disturbance: Secondary | ICD-10-CM

## 2018-11-21 DIAGNOSIS — F329 Major depressive disorder, single episode, unspecified: Secondary | ICD-10-CM

## 2018-11-21 DIAGNOSIS — E039 Hypothyroidism, unspecified: Secondary | ICD-10-CM | POA: Diagnosis not present

## 2018-11-21 DIAGNOSIS — F419 Anxiety disorder, unspecified: Secondary | ICD-10-CM

## 2018-11-21 DIAGNOSIS — D631 Anemia in chronic kidney disease: Secondary | ICD-10-CM | POA: Diagnosis not present

## 2018-11-21 DIAGNOSIS — N183 Chronic kidney disease, stage 3 unspecified: Secondary | ICD-10-CM

## 2018-11-21 DIAGNOSIS — I129 Hypertensive chronic kidney disease with stage 1 through stage 4 chronic kidney disease, or unspecified chronic kidney disease: Secondary | ICD-10-CM | POA: Diagnosis not present

## 2018-11-21 DIAGNOSIS — Z9181 History of falling: Secondary | ICD-10-CM

## 2018-11-21 NOTE — Telephone Encounter (Signed)
Multiple attempts made to contact.  This encounter will now be closed.

## 2018-11-24 DIAGNOSIS — E039 Hypothyroidism, unspecified: Secondary | ICD-10-CM | POA: Diagnosis not present

## 2018-11-24 DIAGNOSIS — N390 Urinary tract infection, site not specified: Secondary | ICD-10-CM | POA: Diagnosis not present

## 2018-11-24 DIAGNOSIS — G9341 Metabolic encephalopathy: Secondary | ICD-10-CM | POA: Diagnosis not present

## 2018-11-24 DIAGNOSIS — I129 Hypertensive chronic kidney disease with stage 1 through stage 4 chronic kidney disease, or unspecified chronic kidney disease: Secondary | ICD-10-CM | POA: Diagnosis not present

## 2018-11-24 DIAGNOSIS — G301 Alzheimer's disease with late onset: Secondary | ICD-10-CM | POA: Diagnosis not present

## 2018-11-24 DIAGNOSIS — B961 Klebsiella pneumoniae [K. pneumoniae] as the cause of diseases classified elsewhere: Secondary | ICD-10-CM | POA: Diagnosis not present

## 2018-11-24 DIAGNOSIS — Z7982 Long term (current) use of aspirin: Secondary | ICD-10-CM | POA: Diagnosis not present

## 2018-11-24 DIAGNOSIS — D631 Anemia in chronic kidney disease: Secondary | ICD-10-CM | POA: Diagnosis not present

## 2018-11-24 DIAGNOSIS — Z87440 Personal history of urinary (tract) infections: Secondary | ICD-10-CM | POA: Diagnosis not present

## 2018-11-24 DIAGNOSIS — Z9181 History of falling: Secondary | ICD-10-CM | POA: Diagnosis not present

## 2018-11-25 DIAGNOSIS — Z9181 History of falling: Secondary | ICD-10-CM | POA: Diagnosis not present

## 2018-11-25 DIAGNOSIS — B961 Klebsiella pneumoniae [K. pneumoniae] as the cause of diseases classified elsewhere: Secondary | ICD-10-CM | POA: Diagnosis not present

## 2018-11-25 DIAGNOSIS — Z7982 Long term (current) use of aspirin: Secondary | ICD-10-CM | POA: Diagnosis not present

## 2018-11-25 DIAGNOSIS — E039 Hypothyroidism, unspecified: Secondary | ICD-10-CM | POA: Diagnosis not present

## 2018-11-25 DIAGNOSIS — N390 Urinary tract infection, site not specified: Secondary | ICD-10-CM | POA: Diagnosis not present

## 2018-11-25 DIAGNOSIS — G301 Alzheimer's disease with late onset: Secondary | ICD-10-CM | POA: Diagnosis not present

## 2018-11-25 DIAGNOSIS — D631 Anemia in chronic kidney disease: Secondary | ICD-10-CM | POA: Diagnosis not present

## 2018-11-25 DIAGNOSIS — G9341 Metabolic encephalopathy: Secondary | ICD-10-CM | POA: Diagnosis not present

## 2018-11-25 DIAGNOSIS — Z87440 Personal history of urinary (tract) infections: Secondary | ICD-10-CM | POA: Diagnosis not present

## 2018-11-25 DIAGNOSIS — I129 Hypertensive chronic kidney disease with stage 1 through stage 4 chronic kidney disease, or unspecified chronic kidney disease: Secondary | ICD-10-CM | POA: Diagnosis not present

## 2018-11-28 DIAGNOSIS — G301 Alzheimer's disease with late onset: Secondary | ICD-10-CM | POA: Diagnosis not present

## 2018-11-28 DIAGNOSIS — I129 Hypertensive chronic kidney disease with stage 1 through stage 4 chronic kidney disease, or unspecified chronic kidney disease: Secondary | ICD-10-CM | POA: Diagnosis not present

## 2018-11-28 DIAGNOSIS — B961 Klebsiella pneumoniae [K. pneumoniae] as the cause of diseases classified elsewhere: Secondary | ICD-10-CM | POA: Diagnosis not present

## 2018-11-28 DIAGNOSIS — Z9181 History of falling: Secondary | ICD-10-CM | POA: Diagnosis not present

## 2018-11-28 DIAGNOSIS — N390 Urinary tract infection, site not specified: Secondary | ICD-10-CM | POA: Diagnosis not present

## 2018-11-28 DIAGNOSIS — E039 Hypothyroidism, unspecified: Secondary | ICD-10-CM | POA: Diagnosis not present

## 2018-11-28 DIAGNOSIS — G9341 Metabolic encephalopathy: Secondary | ICD-10-CM | POA: Diagnosis not present

## 2018-11-28 DIAGNOSIS — Z87440 Personal history of urinary (tract) infections: Secondary | ICD-10-CM | POA: Diagnosis not present

## 2018-11-28 DIAGNOSIS — Z7982 Long term (current) use of aspirin: Secondary | ICD-10-CM | POA: Diagnosis not present

## 2018-11-28 DIAGNOSIS — D631 Anemia in chronic kidney disease: Secondary | ICD-10-CM | POA: Diagnosis not present

## 2018-12-01 ENCOUNTER — Telehealth: Payer: Self-pay | Admitting: Family Medicine

## 2018-12-01 ENCOUNTER — Telehealth: Payer: Self-pay | Admitting: *Deleted

## 2018-12-01 DIAGNOSIS — Z7982 Long term (current) use of aspirin: Secondary | ICD-10-CM | POA: Diagnosis not present

## 2018-12-01 DIAGNOSIS — Z9181 History of falling: Secondary | ICD-10-CM | POA: Diagnosis not present

## 2018-12-01 DIAGNOSIS — N390 Urinary tract infection, site not specified: Secondary | ICD-10-CM | POA: Diagnosis not present

## 2018-12-01 DIAGNOSIS — D631 Anemia in chronic kidney disease: Secondary | ICD-10-CM | POA: Diagnosis not present

## 2018-12-01 DIAGNOSIS — B961 Klebsiella pneumoniae [K. pneumoniae] as the cause of diseases classified elsewhere: Secondary | ICD-10-CM | POA: Diagnosis not present

## 2018-12-01 DIAGNOSIS — G9341 Metabolic encephalopathy: Secondary | ICD-10-CM | POA: Diagnosis not present

## 2018-12-01 DIAGNOSIS — G301 Alzheimer's disease with late onset: Secondary | ICD-10-CM | POA: Diagnosis not present

## 2018-12-01 DIAGNOSIS — E039 Hypothyroidism, unspecified: Secondary | ICD-10-CM | POA: Diagnosis not present

## 2018-12-01 DIAGNOSIS — I129 Hypertensive chronic kidney disease with stage 1 through stage 4 chronic kidney disease, or unspecified chronic kidney disease: Secondary | ICD-10-CM | POA: Diagnosis not present

## 2018-12-01 DIAGNOSIS — Z87440 Personal history of urinary (tract) infections: Secondary | ICD-10-CM | POA: Diagnosis not present

## 2018-12-01 NOTE — Telephone Encounter (Signed)
Refer to previous phone note 

## 2018-12-01 NOTE — Telephone Encounter (Signed)
Yes I am okay with weekly weights and I am okay with referral towards hospice if family agrees, I have tried speaking to the daughter about this a couple times and if family is agreeable towards hospice then I am all for it.

## 2018-12-01 NOTE — Telephone Encounter (Signed)
There was another note that they discussed hospice and I agree with hospice if family is agreeable towards it.  I think we need to set up another phone visit on my phone day with the daughter and see if we can call them again.

## 2018-12-01 NOTE — Telephone Encounter (Signed)
VM from [place] w/ [facility] Requesting weekly weights, verbal ok for social worker, & speech Would like referral for Hospice

## 2018-12-01 NOTE — Telephone Encounter (Signed)
I have tried calling this #3 times, if you guys can try and call and see if he can get through, I have been able to do so

## 2018-12-01 NOTE — Telephone Encounter (Signed)
Spoke with [place] from [facility]. Patient did take one pill at 2pm today. Other than that she is still the same as previous phone call.

## 2018-12-01 NOTE — Telephone Encounter (Signed)
Attempted to return call- NVM  

## 2018-12-03 DIAGNOSIS — N390 Urinary tract infection, site not specified: Secondary | ICD-10-CM | POA: Diagnosis not present

## 2018-12-03 DIAGNOSIS — G9341 Metabolic encephalopathy: Secondary | ICD-10-CM | POA: Diagnosis not present

## 2018-12-03 DIAGNOSIS — I129 Hypertensive chronic kidney disease with stage 1 through stage 4 chronic kidney disease, or unspecified chronic kidney disease: Secondary | ICD-10-CM | POA: Diagnosis not present

## 2018-12-03 DIAGNOSIS — E039 Hypothyroidism, unspecified: Secondary | ICD-10-CM | POA: Diagnosis not present

## 2018-12-03 DIAGNOSIS — D631 Anemia in chronic kidney disease: Secondary | ICD-10-CM | POA: Diagnosis not present

## 2018-12-03 DIAGNOSIS — Z7982 Long term (current) use of aspirin: Secondary | ICD-10-CM | POA: Diagnosis not present

## 2018-12-03 DIAGNOSIS — B961 Klebsiella pneumoniae [K. pneumoniae] as the cause of diseases classified elsewhere: Secondary | ICD-10-CM | POA: Diagnosis not present

## 2018-12-03 DIAGNOSIS — Z87440 Personal history of urinary (tract) infections: Secondary | ICD-10-CM | POA: Diagnosis not present

## 2018-12-03 DIAGNOSIS — Z9181 History of falling: Secondary | ICD-10-CM | POA: Diagnosis not present

## 2018-12-03 DIAGNOSIS — G301 Alzheimer's disease with late onset: Secondary | ICD-10-CM | POA: Diagnosis not present

## 2018-12-04 DIAGNOSIS — I129 Hypertensive chronic kidney disease with stage 1 through stage 4 chronic kidney disease, or unspecified chronic kidney disease: Secondary | ICD-10-CM | POA: Diagnosis not present

## 2018-12-04 DIAGNOSIS — G301 Alzheimer's disease with late onset: Secondary | ICD-10-CM | POA: Diagnosis not present

## 2018-12-04 DIAGNOSIS — N390 Urinary tract infection, site not specified: Secondary | ICD-10-CM | POA: Diagnosis not present

## 2018-12-04 DIAGNOSIS — Z7982 Long term (current) use of aspirin: Secondary | ICD-10-CM | POA: Diagnosis not present

## 2018-12-04 DIAGNOSIS — G9341 Metabolic encephalopathy: Secondary | ICD-10-CM | POA: Diagnosis not present

## 2018-12-04 DIAGNOSIS — B961 Klebsiella pneumoniae [K. pneumoniae] as the cause of diseases classified elsewhere: Secondary | ICD-10-CM | POA: Diagnosis not present

## 2018-12-04 DIAGNOSIS — Z9181 History of falling: Secondary | ICD-10-CM | POA: Diagnosis not present

## 2018-12-04 DIAGNOSIS — E039 Hypothyroidism, unspecified: Secondary | ICD-10-CM | POA: Diagnosis not present

## 2018-12-04 DIAGNOSIS — Z87440 Personal history of urinary (tract) infections: Secondary | ICD-10-CM | POA: Diagnosis not present

## 2018-12-04 DIAGNOSIS — D631 Anemia in chronic kidney disease: Secondary | ICD-10-CM | POA: Diagnosis not present

## 2018-12-08 DIAGNOSIS — Z7982 Long term (current) use of aspirin: Secondary | ICD-10-CM | POA: Diagnosis not present

## 2018-12-08 DIAGNOSIS — D631 Anemia in chronic kidney disease: Secondary | ICD-10-CM | POA: Diagnosis not present

## 2018-12-08 DIAGNOSIS — E039 Hypothyroidism, unspecified: Secondary | ICD-10-CM | POA: Diagnosis not present

## 2018-12-08 DIAGNOSIS — Z87440 Personal history of urinary (tract) infections: Secondary | ICD-10-CM | POA: Diagnosis not present

## 2018-12-08 DIAGNOSIS — Z9181 History of falling: Secondary | ICD-10-CM | POA: Diagnosis not present

## 2018-12-08 DIAGNOSIS — B961 Klebsiella pneumoniae [K. pneumoniae] as the cause of diseases classified elsewhere: Secondary | ICD-10-CM | POA: Diagnosis not present

## 2018-12-08 DIAGNOSIS — G9341 Metabolic encephalopathy: Secondary | ICD-10-CM | POA: Diagnosis not present

## 2018-12-08 DIAGNOSIS — N390 Urinary tract infection, site not specified: Secondary | ICD-10-CM | POA: Diagnosis not present

## 2018-12-08 DIAGNOSIS — I129 Hypertensive chronic kidney disease with stage 1 through stage 4 chronic kidney disease, or unspecified chronic kidney disease: Secondary | ICD-10-CM | POA: Diagnosis not present

## 2018-12-08 DIAGNOSIS — G301 Alzheimer's disease with late onset: Secondary | ICD-10-CM | POA: Diagnosis not present

## 2018-12-09 DIAGNOSIS — I129 Hypertensive chronic kidney disease with stage 1 through stage 4 chronic kidney disease, or unspecified chronic kidney disease: Secondary | ICD-10-CM | POA: Diagnosis not present

## 2018-12-09 DIAGNOSIS — G301 Alzheimer's disease with late onset: Secondary | ICD-10-CM | POA: Diagnosis not present

## 2018-12-09 DIAGNOSIS — D631 Anemia in chronic kidney disease: Secondary | ICD-10-CM | POA: Diagnosis not present

## 2018-12-09 DIAGNOSIS — Z7982 Long term (current) use of aspirin: Secondary | ICD-10-CM | POA: Diagnosis not present

## 2018-12-09 DIAGNOSIS — E039 Hypothyroidism, unspecified: Secondary | ICD-10-CM | POA: Diagnosis not present

## 2018-12-09 DIAGNOSIS — Z87440 Personal history of urinary (tract) infections: Secondary | ICD-10-CM | POA: Diagnosis not present

## 2018-12-09 DIAGNOSIS — B961 Klebsiella pneumoniae [K. pneumoniae] as the cause of diseases classified elsewhere: Secondary | ICD-10-CM | POA: Diagnosis not present

## 2018-12-09 DIAGNOSIS — M6281 Muscle weakness (generalized): Secondary | ICD-10-CM | POA: Diagnosis not present

## 2018-12-09 DIAGNOSIS — Z9181 History of falling: Secondary | ICD-10-CM | POA: Diagnosis not present

## 2018-12-09 DIAGNOSIS — N390 Urinary tract infection, site not specified: Secondary | ICD-10-CM | POA: Diagnosis not present

## 2018-12-09 DIAGNOSIS — R1032 Left lower quadrant pain: Secondary | ICD-10-CM | POA: Diagnosis not present

## 2018-12-09 DIAGNOSIS — G9341 Metabolic encephalopathy: Secondary | ICD-10-CM | POA: Diagnosis not present

## 2018-12-10 ENCOUNTER — Telehealth: Payer: Self-pay | Admitting: Family Medicine

## 2018-12-10 NOTE — Telephone Encounter (Signed)
FYI to pcp

## 2018-12-10 NOTE — Telephone Encounter (Signed)
No calls back. Note closed.

## 2018-12-25 ENCOUNTER — Other Ambulatory Visit: Payer: Self-pay | Admitting: Family Medicine

## 2018-12-31 ENCOUNTER — Telehealth: Payer: Self-pay | Admitting: *Deleted

## 2018-12-31 MED ORDER — RISPERIDONE 1 MG PO TABS: 1.0000 mg | ORAL_TABLET | Freq: Every day | ORAL | 1 refills | Status: DC

## 2018-12-31 NOTE — Addendum Note (Signed)
Addended by: Thana Ates on: 12/31/2018 02:33 PM   Modules accepted: Orders

## 2018-12-31 NOTE — Telephone Encounter (Signed)
Deborah Rivers at Hospice aware 

## 2018-12-31 NOTE — Telephone Encounter (Signed)
Tell them I have sent Risperdal which she can take daily at bedtime which should help calm everything down, if that does not work let me know and we can always try other methods but give at least a few days on that and see if it helps.  I sent to the pharmacy.  Let the family know that it does carry a black box warning in the elderly dementia patients forward heart rhythm abnormalities that could lead to death but with her being on hospice and poor quality of life I think that it is a very valid option for her. Caryl Pina, MD [facility] 12/31/2018, 12:59 PM

## 2018-12-31 NOTE — Telephone Encounter (Signed)
Has episodes of BP dropping and syncope Hospice nurse waning to know if something prn can be added to her Xanax which is given at 8 & 2

## 2018-12-31 NOTE — Telephone Encounter (Signed)
TC from [facility] w/ Hospice Pt has recently had a couple of falls, currently has a black eye. She is acting out. Has episodes

## 2018-12-31 NOTE — Addendum Note (Signed)
Addended by: Caryl Pina on: 12/31/2018 12:59 PM   Modules accepted: Orders

## 2019-01-07 ENCOUNTER — Other Ambulatory Visit: Payer: Self-pay

## 2019-01-07 NOTE — Patient Outreach (Signed)
[facility]) Care Management  01/07/2019  NEGAR PASHIA 27-Feb-1924 [id number]   Medication Adherence call to Mrs. Tinea Lutman HIPPA Compliant Voice message left with a call back number. Mrs. Dubel is showing past due on Atorvastatin 20 mg under [place].   Deputy Management Direct Dial [phone]  Fax [phone] [email or web address]

## 2019-01-09 DIAGNOSIS — M6281 Muscle weakness (generalized): Secondary | ICD-10-CM | POA: Diagnosis not present

## 2019-01-09 DIAGNOSIS — G9341 Metabolic encephalopathy: Secondary | ICD-10-CM | POA: Diagnosis not present

## 2019-01-09 DIAGNOSIS — R1032 Left lower quadrant pain: Secondary | ICD-10-CM | POA: Diagnosis not present

## 2019-01-14 ENCOUNTER — Telehealth: Payer: Self-pay

## 2019-01-14 NOTE — Telephone Encounter (Signed)
Received a call from [facility] with Hospice.  States that patient is declining. States she will no longer swallow foods or medications.  [facility] is having to crush up her pills and put it in apple sauce but it is still a struggle to get patient to take the medication.  Marvis Moeller is wanting to know if we can D/C all medications?  Also wanted Dr. Warrick Parisian to know that she is going to use the standing orders for morphine and Ativan for the Holiday weekend.  Please review and advise.  Call Jarrett Soho back at [phone].

## 2019-01-14 NOTE — Telephone Encounter (Signed)
Hospice nurse aware and verbal for covid test given - ok

## 2019-01-14 NOTE — Telephone Encounter (Signed)
Please review

## 2019-01-14 NOTE — Telephone Encounter (Signed)
They can discontinue the Lipitor but a lot of the other medications are to help with her comfort and mood and state of mind so I do not know if we want to discontinue those once.  I do agree with giving the standing orders for the Ativan which can be the same as what she has currently prescribed and as far as the morphine they can have standing orders for that as well, ask Juliann Pulse what their usual orders are for morphine for hospice, she should be able to do that as well.

## 2019-01-19 ENCOUNTER — Telehealth: Payer: Self-pay

## 2019-01-19 ENCOUNTER — Other Ambulatory Visit: Payer: Self-pay | Admitting: Family Medicine

## 2019-01-19 NOTE — Telephone Encounter (Signed)
Hannah with Hospice called stating that patient is continuing to decline and having a lot of trouble swallowing. They want to know if they can D/C some of her vitamins. They are able to get her xanax and risperdal in barely. Please advise. Hannahs number is [phone]

## 2019-01-19 NOTE — Progress Notes (Signed)
I have discontinued all medications on her list so if there list reflects our is, those are the ones that I would recommend to maintain Caryl Pina, MD [place] 01/19/2019, 12:45 PM

## 2019-01-19 NOTE — Telephone Encounter (Signed)
Yes they can discontinue all vitamins and all of her cholesterol medications, they can even discontinue all of her blood pressure medications if her blood pressure does not shoot through the roof.  The main thing I would think to keep at this point is keep her on her mood medications to help her stay calm

## 2019-01-20 NOTE — Telephone Encounter (Signed)
Hannah with Hospice made aware of d/c meds

## 2019-01-21 ENCOUNTER — Telehealth: Payer: Self-pay | Admitting: Family Medicine

## 2019-01-21 NOTE — Chronic Care Management (AMB) (Signed)
Chronic Care Management   Note  01/21/2019 Name: Deborah Rivers MRN: [id number] DOB: 06/05/1924  Deborah Rivers is a 85 y.o. year old female who is a primary care patient of Dettinger, Fransisca Kaufmann, MD. I reached out to [name] by phone today in response to a referral sent by Ms. [employer] health plan.     Ms. Lagrange was given information about Chronic Care Management services today including:  1. CCM service includes personalized support from designated clinical staff supervised by her physician, including individualized plan of care and coordination with other care providers 2. 24/7 contact phone numbers for assistance for urgent and routine care needs. 3. Service will only be billed when office clinical staff spend 20 minutes or more in a month to coordinate care. 4. Only one practitioner may furnish and bill the service in a calendar month. 5. The patient may stop CCM services at any time (effective at the end of the month) by phone call to the office staff. 6. The patient will be responsible for cost sharing (co-pay) of up to 20% of the service fee (after annual deductible is met).  Patients daughter Romie Minus did not agree to enrollment in care management services due to patient being in an assistance living facility and does not wish to consider at this time.  Follow up plan: No further follow up needed   [place], [place], [place] [address] Direct Dial: [email or web address]  Website: [email or web address]

## 2019-02-02 ENCOUNTER — Telehealth: Payer: Self-pay | Admitting: Family Medicine

## 2019-02-02 DIAGNOSIS — U071 COVID-19: Secondary | ICD-10-CM | POA: Diagnosis not present

## 2019-02-02 NOTE — Telephone Encounter (Signed)
Pt was yelling out over the weekend. Has history of UTI. Family worried she may have a UTI. Wants order to collect urine on patient.

## 2019-02-02 NOTE — Telephone Encounter (Signed)
Hospice giving verbal order

## 2019-02-02 NOTE — Telephone Encounter (Signed)
Yes go ahead and give a verbal order for them to collect a urinalysis and a culture Caryl Pina, MD [facility] 02/02/2019, 1:08 PM

## 2019-02-05 ENCOUNTER — Other Ambulatory Visit: Payer: Self-pay | Admitting: Family Medicine

## 2019-02-05 MED ORDER — RISPERIDONE 1 MG/ML PO SOLN: 1.0000 mg | Freq: Every day | ORAL | 3 refills | Status: AC

## 2019-02-05 MED ORDER — DEXAMETHASONE SODIUM PHOSPHATE 4 MG/ML IJ SOLN: 4.0000 mg | Freq: Every day | INTRAMUSCULAR | 0 refills | Status: AC

## 2019-02-05 MED ORDER — SERTRALINE HCL 20 MG/ML PO CONC: 50.0000 mg | Freq: Every day | ORAL | 3 refills | Status: AC

## 2019-02-05 NOTE — Telephone Encounter (Signed)
Please verify all dosages pending & sign

## 2019-02-05 NOTE — Telephone Encounter (Signed)
Patient tested positive for COVID. Patient is declining and not swallowing her meds. Wanted to know if it was ok to start patient on Dexamethazone. Needs order for it. Also wanted to change other medications to liquid (Sertraline and Risperdal).

## 2019-02-05 NOTE — Telephone Encounter (Signed)
Yes okay to give verbal for dexamethasone injection 4 mg x5 days once daily.  Okay to go ahead and change her other medicines to liquid.  I do not know how to send dexamethasone injection to a pharmacy. Caryl Pina, MD [facility] 02/05/2019, 2:06 PM

## 2019-02-05 NOTE — Telephone Encounter (Signed)
Deborah Rivers could you help me with this order im not sure how to do this?

## 2019-02-09 ENCOUNTER — Telehealth: Payer: Self-pay | Admitting: *Deleted

## 2019-02-09 MED ORDER — SANTYL 250 UNIT/GM EX OINT: 1.0000 "application " | TOPICAL_OINTMENT | Freq: Every day | CUTANEOUS | 1 refills | Status: DC

## 2019-02-09 NOTE — Telephone Encounter (Signed)
Jarrett Soho from hospice aware and verbalizes understanding.

## 2019-02-09 NOTE — Addendum Note (Signed)
Addended by: Caryl Pina on: 02/09/2019 11:22 AM   Modules accepted: Orders

## 2019-02-09 NOTE — Telephone Encounter (Signed)
Pt has a stage 3 ulcer on sacrum Would like a cream to add to dressing changes Please advise and send to [place] call Hospice nurse back as well

## 2019-02-09 NOTE — Telephone Encounter (Signed)
LMTCB

## 2019-02-09 NOTE — Telephone Encounter (Signed)
Please let her know that I sent over Santyl cream which they can use and would prefer them also use some kind of topical triple antibiotic or antibiotic cream on it prior to application of the Santyl. Deborah Pina, MD [facility] 02/09/2019, 11:21 AM

## 2019-02-11 MED ORDER — SANTYL 250 UNIT/GM EX OINT: 1.0000 "application " | TOPICAL_OINTMENT | Freq: Every day | CUTANEOUS | 1 refills | Status: AC

## 2019-02-11 NOTE — Telephone Encounter (Signed)
Rx failed. resent 

## 2019-02-11 NOTE — Addendum Note (Signed)
Addended by: Antonietta Barcelona D on: 02/11/2019 04:08 PM   Modules accepted: Orders

## 2019-02-12 ENCOUNTER — Telehealth: Payer: Self-pay | Admitting: Family Medicine

## 2019-02-12 NOTE — Telephone Encounter (Signed)
Deborah Rivers with hospice called to inform us that the patient was pronounced deceased at 5:09 AM this morning, February 12, 2019 at [facility].

## 2019-02-20 DEATH — deceased

## 2019-02-24 NOTE — Telephone Encounter (Signed)
Thanks for the information

## 2020-06-27 IMAGING — CT CT HEAD WITHOUT CONTRAST
3 series · 16 of 47 positions shown, 19 images · non-contrast
Comparison: Head CT 03/21/2018 and earlier.

CLINICAL DATA: [AGE] female found unresponsive. Confusion.

EXAM:
CT HEAD WITHOUT CONTRAST
TECHNIQUE: Contiguous axial images were obtained from the base of the skull
through the vertex without intravenous contrast.

[Series 2: head w o · axial · 0.42mm/px · z∈[+36,+181]mm · 10 of 35 slices shown, 13 images]
[im 3/35  brain]
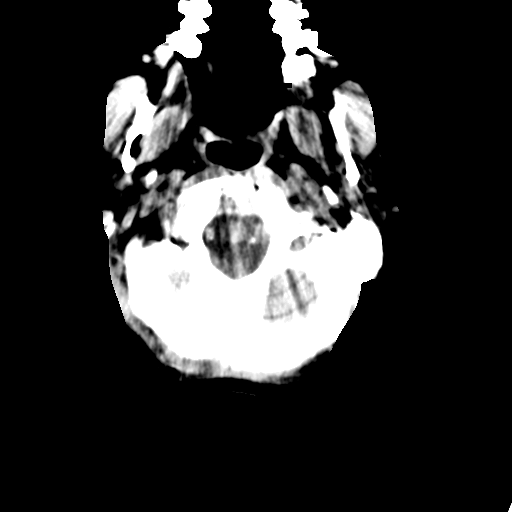
[im 3/35  bone]
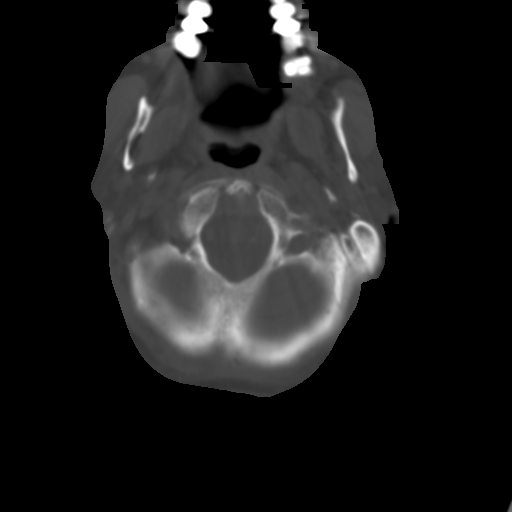
[im 6/35  brain]
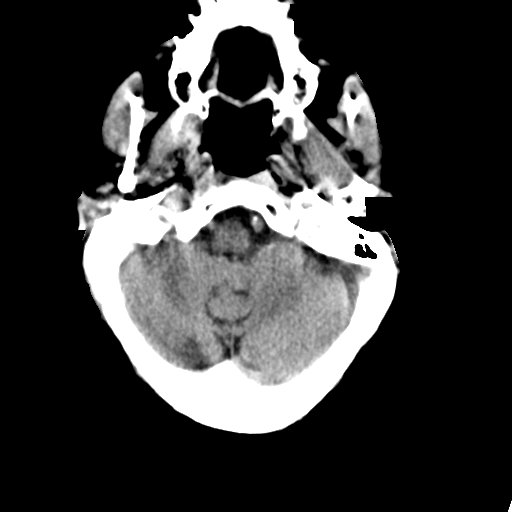
[im 10/35  brain]
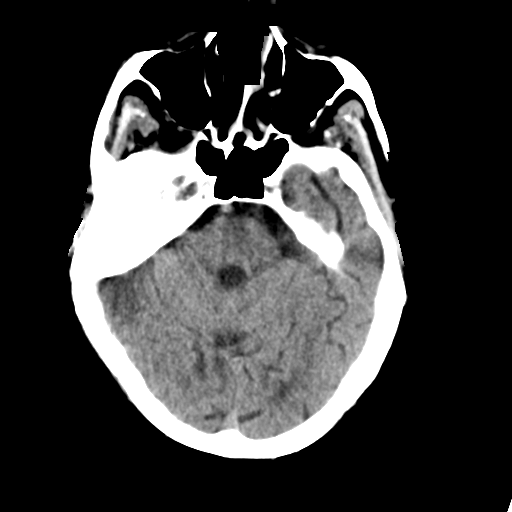
[im 12/35  brain]
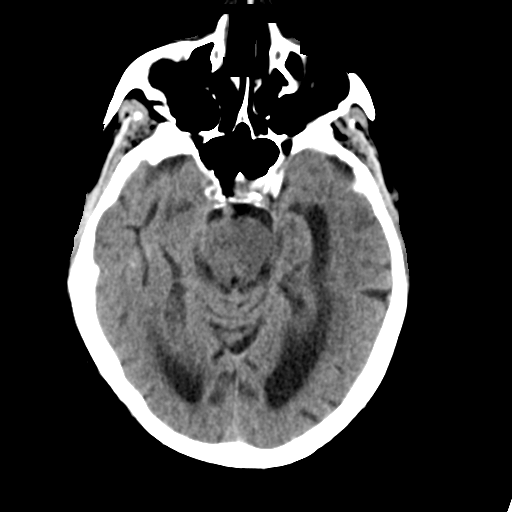
[im 16/35  brain]
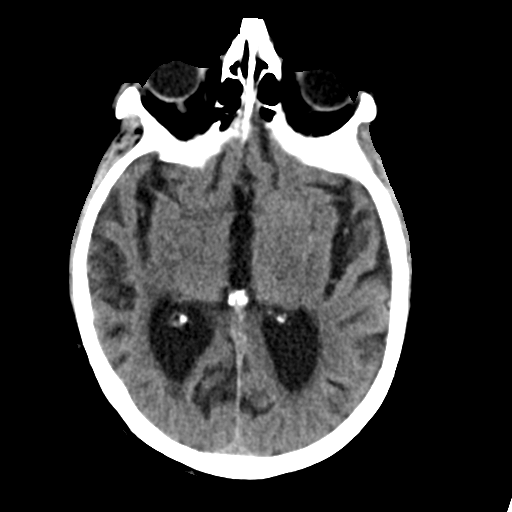
[im 16/35  bone]
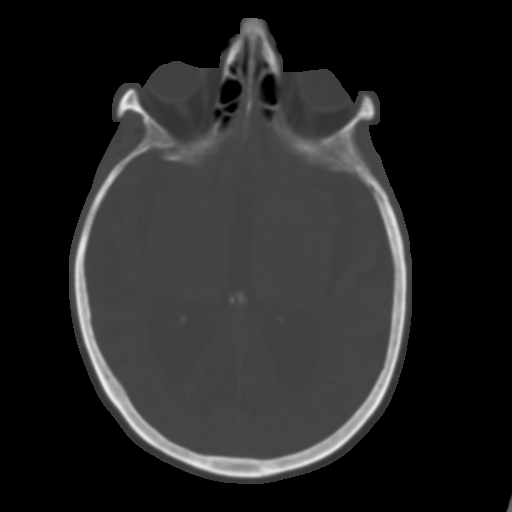
[im 19/35  brain]
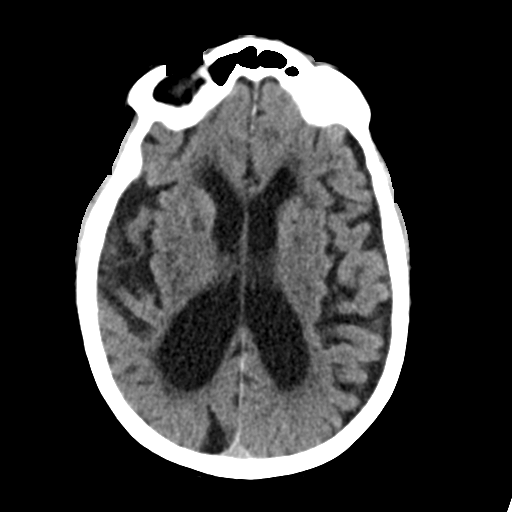
[im 23/35  brain]
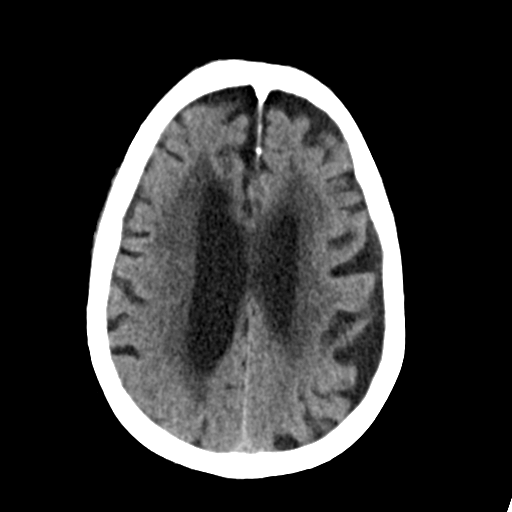
[im 26/35  brain]
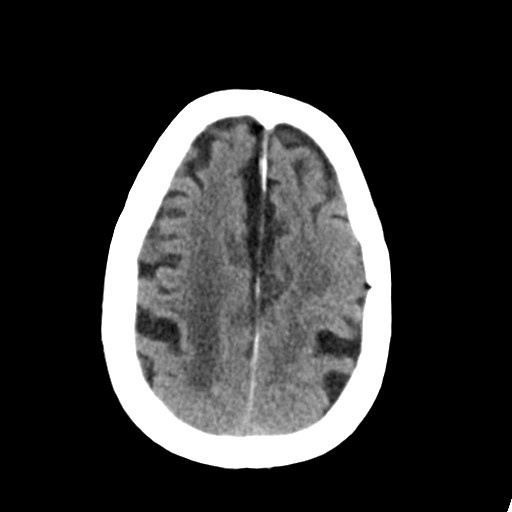
[im 29/35  brain]
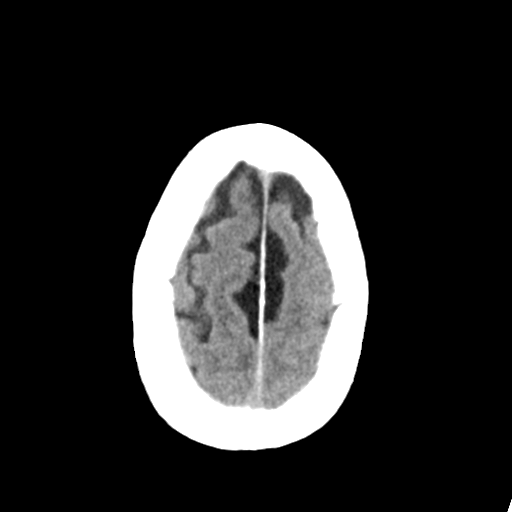
[im 29/35  bone]
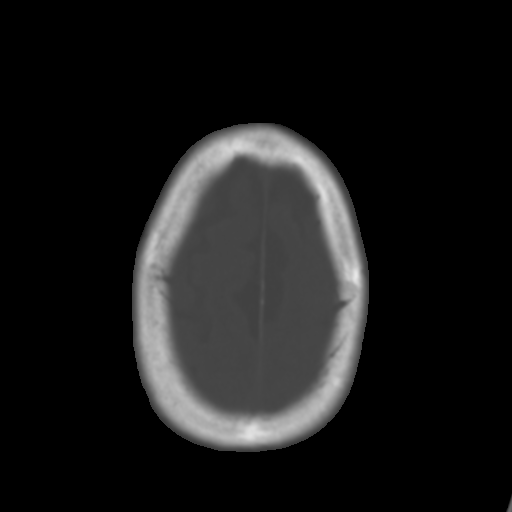
[im 32/35  brain]
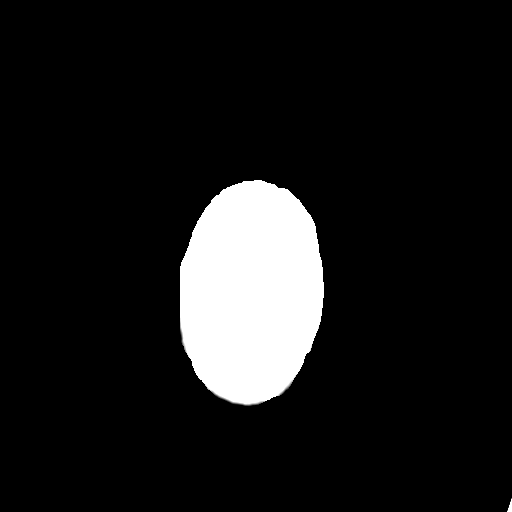

[Series 4: coronal soft · coronal · 0.35mm/px · 3 of 71 slices shown]
[im 24/71  brain]
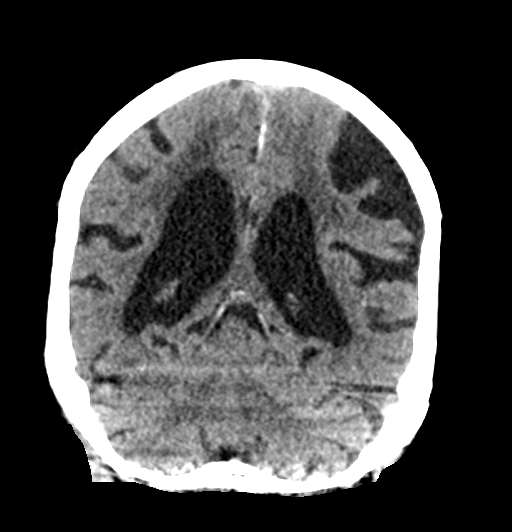
[im 32/71  brain]
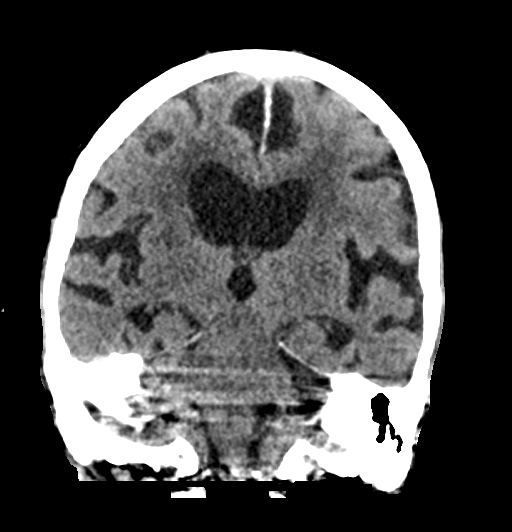
[im 39/71  brain]
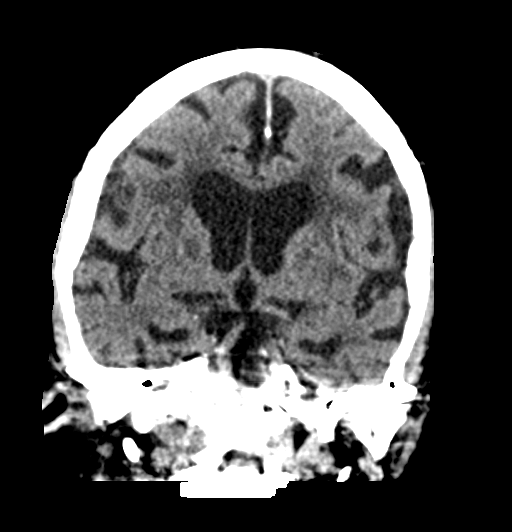

[Series 5: sagittal soft · sagittal · 0.39mm/px · 3 of 55 slices shown]
[im 19/55  brain]
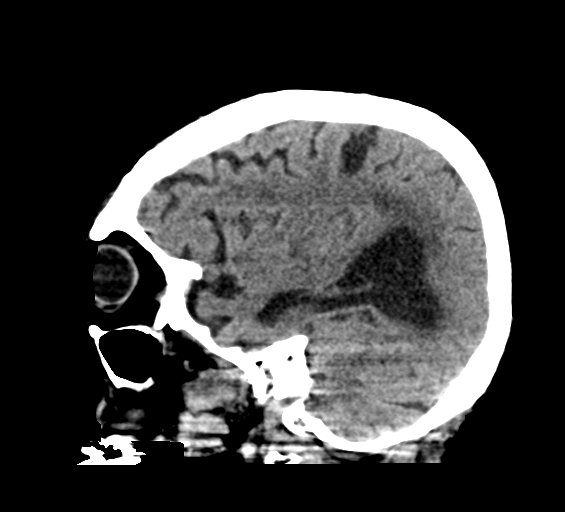
[im 28/55  brain]
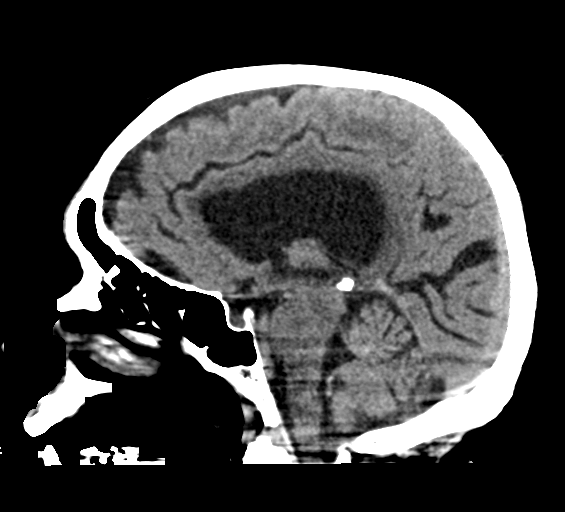
[im 37/55  brain]
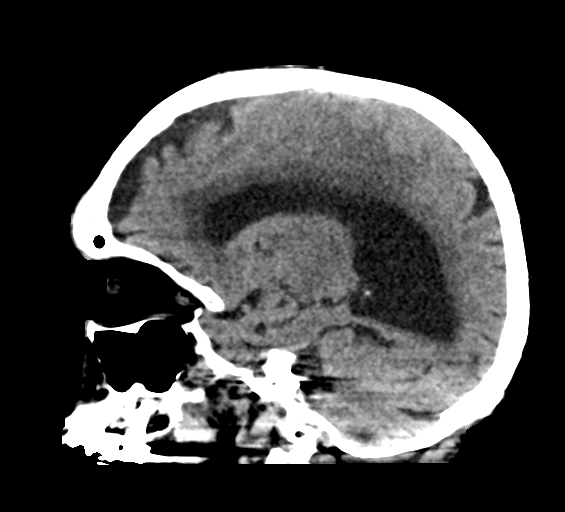

[16 of 47 positions shown; findings below may reference images not displayed]

FINDINGS: Brain: Stable cerebral volume. Stable ventricle size and
configuration. No midline shift, mass effect, or evidence of
intracranial mass lesion. No acute intracranial hemorrhage
identified. Stable gray-white matter differentiation throughout the
brain. Confluent cerebral white matter hypodensity. Deep gray matter
nuclei heterogeneity. No cortically based acute infarct identified.

Vascular: Calcified atherosclerosis at the skull base. Intracranial
artery tortuosity. No suspicious intracranial vascular hyperdensity.

Skull: No acute osseous abnormality identified.

Sinuses/Orbits: Paranasal sinuses are clear. Chronic right tympanic
cavity and mastoid opacification appears stable. Left tympanic
cavity and mastoids remain clear.

Other: No acute orbit or scalp soft tissue findings.
IMPRESSION: No acute intracranial abnormality. Stable non contrast CT appearance
of the brain since [DATE].
